# Patient Record
Sex: Female | Born: 1940 | Race: White | Hispanic: No | Smoking: Never smoker
Health system: Southern US, Community
[De-identification: ages and names within clinical notes are randomized; demographics above are authoritative.]

## PROBLEM LIST (undated history)

## (undated) DIAGNOSIS — E049 Nontoxic goiter, unspecified: Secondary | ICD-10-CM

## (undated) DIAGNOSIS — J45909 Unspecified asthma, uncomplicated: Secondary | ICD-10-CM

## (undated) DIAGNOSIS — E785 Hyperlipidemia, unspecified: Secondary | ICD-10-CM

## (undated) DIAGNOSIS — K219 Gastro-esophageal reflux disease without esophagitis: Secondary | ICD-10-CM

## (undated) DIAGNOSIS — R7309 Other abnormal glucose: Secondary | ICD-10-CM

## (undated) DIAGNOSIS — D649 Anemia, unspecified: Secondary | ICD-10-CM

## (undated) DIAGNOSIS — I341 Nonrheumatic mitral (valve) prolapse: Secondary | ICD-10-CM

## (undated) DIAGNOSIS — M8580 Other specified disorders of bone density and structure, unspecified site: Secondary | ICD-10-CM

## (undated) DIAGNOSIS — R195 Other fecal abnormalities: Secondary | ICD-10-CM

## (undated) DIAGNOSIS — I10 Essential (primary) hypertension: Secondary | ICD-10-CM

## (undated) HISTORY — PX: TONSILLECTOMY AND ADENOIDECTOMY: SHX28

## (undated) HISTORY — DX: Gastro-esophageal reflux disease without esophagitis: K21.9

## (undated) HISTORY — DX: Essential (primary) hypertension: I10

## (undated) HISTORY — DX: Other abnormal glucose: R73.09

## (undated) HISTORY — PX: APPENDECTOMY: SHX54

## (undated) HISTORY — DX: Hyperlipidemia, unspecified: E78.5

## (undated) HISTORY — DX: Nonrheumatic mitral (valve) prolapse: I34.1

## (undated) HISTORY — PX: EYE SURGERY: SHX253

## (undated) HISTORY — DX: Other specified disorders of bone density and structure, unspecified site: M85.80

## (undated) HISTORY — DX: Nontoxic goiter, unspecified: E04.9

## (undated) HISTORY — DX: Unspecified asthma, uncomplicated: J45.909

## (undated) HISTORY — DX: Anemia, unspecified: D64.9

## (undated) HISTORY — PX: CHOLECYSTECTOMY: SHX55

---
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
## 1998-07-11 ENCOUNTER — Ambulatory Visit (HOSPITAL_COMMUNITY): Admission: RE | Admit: 1998-07-11 | Discharge: 1998-07-11 | Payer: Self-pay | Admitting: *Deleted

## 1998-08-09 ENCOUNTER — Ambulatory Visit (HOSPITAL_COMMUNITY): Admission: RE | Admit: 1998-08-09 | Discharge: 1998-08-09 | Payer: Self-pay | Admitting: Gastroenterology

## 1999-07-06 ENCOUNTER — Other Ambulatory Visit: Admission: RE | Admit: 1999-07-06 | Discharge: 1999-07-06 | Payer: Self-pay | Admitting: *Deleted

## 1999-09-04 ENCOUNTER — Ambulatory Visit (HOSPITAL_COMMUNITY): Admission: RE | Admit: 1999-09-04 | Discharge: 1999-09-04 | Payer: Self-pay | Admitting: *Deleted

## 2000-09-05 ENCOUNTER — Other Ambulatory Visit: Admission: RE | Admit: 2000-09-05 | Discharge: 2000-09-05 | Payer: Self-pay | Admitting: Internal Medicine

## 2000-09-05 ENCOUNTER — Ambulatory Visit (HOSPITAL_COMMUNITY): Admission: RE | Admit: 2000-09-05 | Discharge: 2000-09-05 | Payer: Self-pay | Admitting: *Deleted

## 2001-09-15 ENCOUNTER — Encounter: Payer: Self-pay | Admitting: Internal Medicine

## 2001-09-15 ENCOUNTER — Encounter: Admission: RE | Admit: 2001-09-15 | Discharge: 2001-09-15 | Payer: Self-pay | Admitting: Internal Medicine

## 2001-09-23 ENCOUNTER — Ambulatory Visit (HOSPITAL_COMMUNITY): Admission: RE | Admit: 2001-09-23 | Discharge: 2001-09-23 | Payer: Self-pay | Admitting: Internal Medicine

## 2001-09-23 ENCOUNTER — Encounter: Payer: Self-pay | Admitting: Internal Medicine

## 2001-09-25 ENCOUNTER — Ambulatory Visit (HOSPITAL_COMMUNITY): Admission: RE | Admit: 2001-09-25 | Discharge: 2001-09-25 | Payer: Self-pay | Admitting: Internal Medicine

## 2001-09-25 ENCOUNTER — Encounter: Payer: Self-pay | Admitting: Internal Medicine

## 2002-09-10 ENCOUNTER — Ambulatory Visit (HOSPITAL_COMMUNITY): Admission: RE | Admit: 2002-09-10 | Discharge: 2002-09-10 | Payer: Self-pay | Admitting: Internal Medicine

## 2002-09-10 ENCOUNTER — Encounter: Payer: Self-pay | Admitting: Internal Medicine

## 2002-09-16 ENCOUNTER — Other Ambulatory Visit: Admission: RE | Admit: 2002-09-16 | Discharge: 2002-09-16 | Payer: Self-pay | Admitting: Otolaryngology

## 2002-09-27 ENCOUNTER — Ambulatory Visit (HOSPITAL_COMMUNITY): Admission: RE | Admit: 2002-09-27 | Discharge: 2002-09-27 | Payer: Self-pay | Admitting: Internal Medicine

## 2002-09-27 ENCOUNTER — Encounter: Payer: Self-pay | Admitting: Internal Medicine

## 2003-09-29 ENCOUNTER — Ambulatory Visit (HOSPITAL_COMMUNITY): Admission: RE | Admit: 2003-09-29 | Discharge: 2003-09-29 | Payer: Self-pay | Admitting: Internal Medicine

## 2004-10-08 ENCOUNTER — Ambulatory Visit (HOSPITAL_COMMUNITY): Admission: RE | Admit: 2004-10-08 | Discharge: 2004-10-08 | Payer: Self-pay | Admitting: Internal Medicine

## 2004-12-22 ENCOUNTER — Emergency Department (HOSPITAL_COMMUNITY): Admission: EM | Admit: 2004-12-22 | Discharge: 2004-12-22 | Payer: Self-pay | Admitting: Emergency Medicine

## 2005-11-11 ENCOUNTER — Ambulatory Visit (HOSPITAL_COMMUNITY): Admission: RE | Admit: 2005-11-11 | Discharge: 2005-11-11 | Payer: Self-pay | Admitting: Internal Medicine

## 2009-08-11 ENCOUNTER — Ambulatory Visit (HOSPITAL_COMMUNITY): Admission: RE | Admit: 2009-08-11 | Discharge: 2009-08-11 | Payer: Self-pay | Admitting: Internal Medicine

## 2009-09-09 LAB — HM PAP SMEAR: HM PAP: NEGATIVE

## 2009-12-05 ENCOUNTER — Ambulatory Visit (HOSPITAL_COMMUNITY): Admission: RE | Admit: 2009-12-05 | Discharge: 2009-12-05 | Payer: Self-pay | Admitting: Internal Medicine

## 2009-12-14 ENCOUNTER — Ambulatory Visit (HOSPITAL_COMMUNITY): Admission: RE | Admit: 2009-12-14 | Discharge: 2009-12-14 | Payer: Self-pay | Admitting: Internal Medicine

## 2010-01-11 LAB — HM COLONOSCOPY

## 2010-09-30 ENCOUNTER — Encounter: Payer: Self-pay | Admitting: Internal Medicine

## 2010-10-10 ENCOUNTER — Other Ambulatory Visit (HOSPITAL_COMMUNITY): Payer: Self-pay | Admitting: Internal Medicine

## 2010-10-10 DIAGNOSIS — Z1239 Encounter for other screening for malignant neoplasm of breast: Secondary | ICD-10-CM

## 2010-10-10 DIAGNOSIS — Z1231 Encounter for screening mammogram for malignant neoplasm of breast: Secondary | ICD-10-CM

## 2010-10-18 ENCOUNTER — Ambulatory Visit (HOSPITAL_COMMUNITY)
Admission: RE | Admit: 2010-10-18 | Discharge: 2010-10-18 | Disposition: A | Discharge status: Home / Self Care | Payer: Medicare Other | Source: Ambulatory Visit | Attending: Internal Medicine | Admitting: Internal Medicine

## 2010-10-18 DIAGNOSIS — Z1231 Encounter for screening mammogram for malignant neoplasm of breast: Secondary | ICD-10-CM | POA: Insufficient documentation

## 2011-11-26 ENCOUNTER — Other Ambulatory Visit (HOSPITAL_COMMUNITY): Payer: Self-pay | Admitting: Internal Medicine

## 2011-11-26 DIAGNOSIS — Z1231 Encounter for screening mammogram for malignant neoplasm of breast: Secondary | ICD-10-CM

## 2011-12-18 ENCOUNTER — Other Ambulatory Visit (HOSPITAL_COMMUNITY): Payer: Self-pay | Admitting: Internal Medicine

## 2011-12-18 DIAGNOSIS — Z1231 Encounter for screening mammogram for malignant neoplasm of breast: Secondary | ICD-10-CM

## 2011-12-18 DIAGNOSIS — M8580 Other specified disorders of bone density and structure, unspecified site: Secondary | ICD-10-CM

## 2012-01-07 ENCOUNTER — Ambulatory Visit (HOSPITAL_COMMUNITY): Payer: Medicare Other

## 2012-09-23 ENCOUNTER — Ambulatory Visit (HOSPITAL_COMMUNITY)
Admission: RE | Admit: 2012-09-23 | Discharge: 2012-09-23 | Disposition: A | Discharge status: Home / Self Care | Payer: Medicare Other | Source: Ambulatory Visit | Attending: Internal Medicine | Admitting: Internal Medicine

## 2012-09-23 DIAGNOSIS — Z780 Asymptomatic menopausal state: Secondary | ICD-10-CM | POA: Insufficient documentation

## 2012-09-23 DIAGNOSIS — Z13820 Encounter for screening for osteoporosis: Secondary | ICD-10-CM | POA: Insufficient documentation

## 2012-09-23 DIAGNOSIS — Z1231 Encounter for screening mammogram for malignant neoplasm of breast: Secondary | ICD-10-CM

## 2012-09-23 DIAGNOSIS — M8580 Other specified disorders of bone density and structure, unspecified site: Secondary | ICD-10-CM

## 2012-09-23 DIAGNOSIS — M899 Disorder of bone, unspecified: Secondary | ICD-10-CM | POA: Insufficient documentation

## 2012-09-23 LAB — HM DEXA SCAN

## 2013-02-16 LAB — FECAL OCCULT BLOOD, GUAIAC: Fecal Occult Blood: NEGATIVE

## 2013-06-23 LAB — LIPID PANEL
Cholesterol: 207 mg/dL — AB (ref 0–200)
HDL: 52 mg/dL (ref 35–70)
LDL CALC: 127 mg/dL
Triglycerides: 141 mg/dL (ref 40–160)

## 2013-06-23 LAB — HEMOGLOBIN A1C: HEMOGLOBIN A1C: 5.8 % (ref 4.0–6.0)

## 2013-06-25 ENCOUNTER — Other Ambulatory Visit (HOSPITAL_COMMUNITY): Payer: Self-pay | Admitting: Internal Medicine

## 2013-06-25 DIAGNOSIS — Z1231 Encounter for screening mammogram for malignant neoplasm of breast: Secondary | ICD-10-CM

## 2013-07-01 ENCOUNTER — Ambulatory Visit (HOSPITAL_COMMUNITY)
Admission: RE | Admit: 2013-07-01 | Discharge: 2013-07-01 | Disposition: A | Discharge status: Home / Self Care | Payer: Medicare Other | Source: Ambulatory Visit | Attending: Internal Medicine | Admitting: Internal Medicine

## 2013-07-01 DIAGNOSIS — Z1231 Encounter for screening mammogram for malignant neoplasm of breast: Secondary | ICD-10-CM

## 2013-07-01 LAB — HM MAMMOGRAPHY: HM MAMMO: NEGATIVE

## 2013-09-12 ENCOUNTER — Encounter: Payer: Self-pay | Admitting: Internal Medicine

## 2013-09-12 DIAGNOSIS — D649 Anemia, unspecified: Secondary | ICD-10-CM

## 2013-09-12 DIAGNOSIS — K219 Gastro-esophageal reflux disease without esophagitis: Secondary | ICD-10-CM | POA: Insufficient documentation

## 2013-09-12 DIAGNOSIS — E785 Hyperlipidemia, unspecified: Secondary | ICD-10-CM

## 2013-09-12 DIAGNOSIS — I10 Essential (primary) hypertension: Secondary | ICD-10-CM

## 2013-09-12 DIAGNOSIS — I341 Nonrheumatic mitral (valve) prolapse: Secondary | ICD-10-CM | POA: Insufficient documentation

## 2013-09-12 DIAGNOSIS — J45909 Unspecified asthma, uncomplicated: Secondary | ICD-10-CM

## 2013-09-12 DIAGNOSIS — E782 Mixed hyperlipidemia: Secondary | ICD-10-CM | POA: Insufficient documentation

## 2013-09-12 DIAGNOSIS — E049 Nontoxic goiter, unspecified: Secondary | ICD-10-CM

## 2013-09-12 DIAGNOSIS — R7309 Other abnormal glucose: Secondary | ICD-10-CM | POA: Insufficient documentation

## 2013-09-16 ENCOUNTER — Encounter: Payer: Self-pay | Admitting: Emergency Medicine

## 2013-09-16 ENCOUNTER — Ambulatory Visit (INDEPENDENT_AMBULATORY_CARE_PROVIDER_SITE_OTHER): Payer: Medicare Other | Admitting: Emergency Medicine

## 2013-09-16 VITALS — BP 112/64 | HR 78 | Temp 98.0°F | Resp 16 | Ht 64.5 in | Wt 141.0 lb

## 2013-09-16 DIAGNOSIS — I10 Essential (primary) hypertension: Secondary | ICD-10-CM

## 2013-09-16 DIAGNOSIS — R059 Cough, unspecified: Secondary | ICD-10-CM

## 2013-09-16 DIAGNOSIS — R7309 Other abnormal glucose: Secondary | ICD-10-CM

## 2013-09-16 DIAGNOSIS — J309 Allergic rhinitis, unspecified: Secondary | ICD-10-CM

## 2013-09-16 DIAGNOSIS — J329 Chronic sinusitis, unspecified: Secondary | ICD-10-CM

## 2013-09-16 DIAGNOSIS — E782 Mixed hyperlipidemia: Secondary | ICD-10-CM

## 2013-09-16 DIAGNOSIS — R05 Cough: Secondary | ICD-10-CM

## 2013-09-16 DIAGNOSIS — E559 Vitamin D deficiency, unspecified: Secondary | ICD-10-CM

## 2013-09-16 LAB — CBC WITH DIFFERENTIAL/PLATELET
BASOS ABS: 0.1 10*3/uL (ref 0.0–0.1)
BASOS PCT: 1 % (ref 0–1)
Eosinophils Absolute: 0.2 10*3/uL (ref 0.0–0.7)
Eosinophils Relative: 3 % (ref 0–5)
HEMATOCRIT: 41.4 % (ref 36.0–46.0)
Hemoglobin: 14 g/dL (ref 12.0–15.0)
Lymphocytes Relative: 15 % (ref 12–46)
Lymphs Abs: 1.3 10*3/uL (ref 0.7–4.0)
MCH: 32 pg (ref 26.0–34.0)
MCHC: 33.8 g/dL (ref 30.0–36.0)
MCV: 94.5 fL (ref 78.0–100.0)
Monocytes Absolute: 0.8 10*3/uL (ref 0.1–1.0)
Monocytes Relative: 9 % (ref 3–12)
NEUTROS ABS: 6 10*3/uL (ref 1.7–7.7)
NEUTROS PCT: 72 % (ref 43–77)
Platelets: 333 10*3/uL (ref 150–400)
RBC: 4.38 MIL/uL (ref 3.87–5.11)
RDW: 13.5 % (ref 11.5–15.5)
WBC: 8.4 10*3/uL (ref 4.0–10.5)

## 2013-09-16 LAB — HEMOGLOBIN A1C
Hgb A1c MFr Bld: 5.3 % (ref <5.7)
Mean Plasma Glucose: 105 mg/dL (ref <117)

## 2013-09-16 MED ORDER — PREDNISONE 10 MG PO TABS: ORAL_TABLET | ORAL | Status: DC

## 2013-09-16 MED ORDER — BENZONATATE 200 MG PO CAPS: 200.0000 mg | ORAL_CAPSULE | Freq: Three times a day (TID) | ORAL | Status: DC | PRN

## 2013-09-16 MED ORDER — AZITHROMYCIN 250 MG PO TABS: ORAL_TABLET | ORAL | Status: AC

## 2013-09-16 NOTE — Progress Notes (Signed)
Subjective:    Patient ID: Isabel Oliver, female    DOB: February 20, 1941, 73 y.o.   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 She started with allergy drainage and lung congestion after exposure to ducks. She has noted bird allergy. She is now having colored production.  She is also needs presents for 3 month F/U for HTN, Cholesterol, Pre-Dm, D. Deficient. LAST LABS T 207 TG 141 LDL 127 H 52 A1C 5.8  D 63 INSULIN 25  She is eating healthy, small portions, decreased meats/ cheeses. She is more active with new job. Her BP has been good at home.  Hyperlipidemia  Hypertension   Current Outpatient Prescriptions on File Prior to Visit  Medication Sig Dispense Refill  . albuterol (PROVENTIL HFA;VENTOLIN HFA) 108 (90 BASE) MCG/ACT inhaler Inhale into the lungs every 6 (six) hours as needed for wheezing or shortness of breath.      Marland Kitchen aspirin 81 MG chewable tablet Chew 81 mg by mouth daily.      . cetirizine (ZYRTEC) 10 MG tablet Take 10 mg by mouth daily.      . Cholecalciferol (VITAMIN D) 2000 UNITS CAPS Take by mouth.      Marland Kitchen ipratropium-albuterol (DUONEB) 0.5-2.5 (3) MG/3ML SOLN Take 3 mLs by nebulization.      . montelukast (SINGULAIR) 10 MG tablet Take 10 mg by mouth at bedtime.      Marland Kitchen omeprazole (PRILOSEC) 20 MG capsule Take 20 mg by mouth daily.      . pravastatin (PRAVACHOL) 40 MG tablet Take 40 mg by mouth daily.      . verapamil (VERELAN PM) 240 MG 24 hr capsule Take 240 mg by mouth at bedtime.       No current facility-administered medications on file prior to visit.   ALLERGIES Adhesive; Codeine; Penicillins; Shellfish allergy; and Iodine  Past Medical History  Diagnosis Date  . Hypertension   . Hyperlipidemia   . GERD (gastroesophageal reflux disease)   . Other abnormal glucose   . Anemia   . Asthma   . Osteopenia   . MVP (mitral valve prolapse)   . Goiter       Review of  Systems  HENT: Positive for congestion, postnasal drip and sinus pressure.   Respiratory: Positive for cough.   All other systems reviewed and are negative.   BP 112/64  Pulse 78  Temp(Src) 98 F (36.7 C) (Temporal)  Resp 16  Ht 5' 4.5" (1.638 m)  Wt 141 lb (63.957 kg)  BMI 23.84 kg/m2     Objective:   Physical Exam  Nursing note and vitals reviewed. Constitutional: She is oriented to person, place, and time. She appears well-developed and well-nourished. No distress.  HENT:  Head: Normocephalic and atraumatic.  Right Ear: External ear normal.  Left Ear: External ear normal.  Nose: Nose normal.  Mouth/Throat: Oropharynx is clear and moist. No oropharyngeal exudate.  Yellow TMs Maxillary/ Frontal tenderness  Eyes: Conjunctivae and EOM are normal.  Neck: Normal range of motion. Neck supple. No JVD present. No thyromegaly present.  Cardiovascular: Normal rate, regular rhythm, normal heart sounds and intact distal pulses.   Pulmonary/Chest: Effort normal and breath sounds normal.  Abdominal: Soft. Bowel sounds are normal. She exhibits no distension and no mass. There is no tenderness. There is no rebound and no guarding.  Musculoskeletal: Normal range of motion. She exhibits no edema and no tenderness.  Lymphadenopathy:    She has no cervical adenopathy.  Neurological: She is alert and oriented to person, place, and time. No cranial nerve deficit.  Skin: Skin is warm and dry. No rash noted. No erythema. No pallor.  Psychiatric: She has a normal mood and affect. Her behavior is normal. Judgment and thought content normal.          Assessment & Plan:  1.  3 month F/U for HTN, Cholesterol, Pre-Dm, D. Deficient. Needs healthy diet, cardio QD and obtain healthy weight. Check Labs, Check BP if >130/80 call office 2. Cough/ Sinusitis/ Allergic rhinitis-Switch to Allegra OTC, increase H2o, allergy hygiene explained. Zpak, Pred DP 10 mg, Tessalon Perles 200 mg all AD.

## 2013-09-16 NOTE — Patient Instructions (Signed)
Sinusitis Sinusitis is redness, soreness, and puffiness (inflammation) of the air pockets in the bones of your face (sinuses). The redness, soreness, and puffiness can cause air and mucus to get trapped in your sinuses. This can allow germs to grow and cause an infection.  HOME CARE   Drink enough fluids to keep your pee (urine) clear or pale yellow.  Use a humidifier in your home.  Run a hot shower to create steam in the bathroom. Sit in the bathroom with the door closed. Breathe in the steam 3 4 times a day.  Put a warm, moist washcloth on your face 3 4 times a day, or as told by your doctor.  Use salt water sprays (saline sprays) to wet the thick fluid in your nose. This can help the sinuses drain.  Only take medicine as told by your doctor. GET HELP RIGHT AWAY IF:   Your pain gets worse.  You have very bad headaches.  You are sick to your stomach (nauseous).  You throw up (vomit).  You are very sleepy (drowsy) all the time.  Your face is puffy (swollen).  Your vision changes.  You have a stiff neck.  You have trouble breathing. MAKE SURE YOU:   Understand these instructions.  Will watch your condition.  Will get help right away if you are not doing well or get worse. Document Released: 02/12/2008 Document Revised: 05/20/2012 Document Reviewed: 03/31/2012 [facility] Patient Information 2014 [place]. Allergic Rhinitis Allergic rhinitis is when the mucous membranes in the nose respond to allergens. Allergens are particles in the air that cause your body to have an allergic reaction. This causes you to release allergic antibodies. Through a chain of events, these eventually cause you to release histamine into the blood stream (hence the use of antihistamines). Although meant to be protective to the body, it is this release that causes your discomfort, such as frequent sneezing, congestion and an itchy runny nose.  CAUSES  The pollen allergens may come from grasses,  trees, and weeds. This is seasonal allergic rhinitis, or "hay fever." Other allergens cause year-round allergic rhinitis (perennial allergic rhinitis) such as house dust mite allergen, pet dander and mold spores.  SYMPTOMS   Nasal stuffiness (congestion).  Runny, itchy nose with sneezing and tearing of the eyes.  There is often an itching of the mouth, eyes and ears. It cannot be cured, but it can be controlled with medications. DIAGNOSIS  If you are unable to determine the offending allergen, skin or blood testing may find it. TREATMENT   Avoid the allergen.  Medications and allergy shots (immunotherapy) can help.  Hay fever may often be treated with antihistamines in pill or nasal spray forms. Antihistamines block the effects of histamine. There are over-the-counter medicines that may help with nasal congestion and swelling around the eyes. Check with your caregiver before taking or giving this medicine. If the treatment above does not work, there are many new medications your caregiver can prescribe. Stronger medications may be used if initial measures are ineffective. Desensitizing injections can be used if medications and avoidance fails. Desensitization is when a patient is given ongoing shots until the body becomes less sensitive to the allergen. Make sure you follow up with your caregiver if problems continue. SEEK MEDICAL CARE IF:   You develop fever (more than 100.5 F (38.1 C).  You develop a cough that does not stop easily (persistent).  You have shortness of breath.  You start wheezing.  Symptoms interfere with  normal daily activities. Document Released: 05/21/2001 Document Revised: 11/18/2011 Document Reviewed: 11/30/2008 [facility] Patient Information 2014 [place].

## 2013-09-17 LAB — HEPATIC FUNCTION PANEL
ALT: 16 U/L (ref 0–35)
AST: 20 U/L (ref 0–37)
Albumin: 4.4 g/dL (ref 3.5–5.2)
Alkaline Phosphatase: 79 U/L (ref 39–117)
BILIRUBIN DIRECT: 0.1 mg/dL (ref 0.0–0.3)
BILIRUBIN INDIRECT: 0.2 mg/dL (ref 0.0–0.9)
BILIRUBIN TOTAL: 0.3 mg/dL (ref 0.3–1.2)
Total Protein: 7 g/dL (ref 6.0–8.3)

## 2013-09-17 LAB — BASIC METABOLIC PANEL WITH GFR
BUN: 11 mg/dL (ref 6–23)
CALCIUM: 10.1 mg/dL (ref 8.4–10.5)
CHLORIDE: 102 meq/L (ref 96–112)
CO2: 30 mEq/L (ref 19–32)
CREATININE: 0.63 mg/dL (ref 0.50–1.10)
Glucose, Bld: 80 mg/dL (ref 70–99)
Potassium: 4.5 mEq/L (ref 3.5–5.3)
Sodium: 139 mEq/L (ref 135–145)

## 2013-09-17 LAB — LIPID PANEL
CHOL/HDL RATIO: 3.8 ratio
CHOLESTEROL: 216 mg/dL — AB (ref 0–200)
HDL: 57 mg/dL (ref >39)
LDL CALC: 133 mg/dL — AB (ref 0–99)
TRIGLYCERIDES: 132 mg/dL (ref <150)
VLDL: 26 mg/dL (ref 0–40)

## 2013-09-17 LAB — INSULIN, FASTING: Insulin fasting, serum: 14 u[IU]/mL (ref 3–28)

## 2013-12-29 ENCOUNTER — Other Ambulatory Visit: Payer: Self-pay | Admitting: Emergency Medicine

## 2013-12-29 MED ORDER — ALBUTEROL SULFATE HFA 108 (90 BASE) MCG/ACT IN AERS: 1.0000 | INHALATION_SPRAY | Freq: Four times a day (QID) | RESPIRATORY_TRACT | Status: DC | PRN

## 2014-01-20 ENCOUNTER — Encounter: Payer: Self-pay | Admitting: Physician Assistant

## 2014-02-01 ENCOUNTER — Encounter: Payer: Self-pay | Admitting: Physician Assistant

## 2014-02-01 ENCOUNTER — Ambulatory Visit (INDEPENDENT_AMBULATORY_CARE_PROVIDER_SITE_OTHER): Payer: Medicare Other | Admitting: Physician Assistant

## 2014-02-01 VITALS — BP 122/70 | HR 76 | Temp 98.1°F | Resp 16

## 2014-02-01 DIAGNOSIS — Z0000 Encounter for general adult medical examination without abnormal findings: Secondary | ICD-10-CM

## 2014-02-01 DIAGNOSIS — Z1331 Encounter for screening for depression: Secondary | ICD-10-CM

## 2014-02-01 DIAGNOSIS — I059 Rheumatic mitral valve disease, unspecified: Secondary | ICD-10-CM

## 2014-02-01 DIAGNOSIS — I341 Nonrheumatic mitral (valve) prolapse: Secondary | ICD-10-CM

## 2014-02-01 DIAGNOSIS — I10 Essential (primary) hypertension: Secondary | ICD-10-CM

## 2014-02-01 DIAGNOSIS — Z23 Encounter for immunization: Secondary | ICD-10-CM

## 2014-02-01 DIAGNOSIS — Z789 Other specified health status: Secondary | ICD-10-CM

## 2014-02-01 DIAGNOSIS — E559 Vitamin D deficiency, unspecified: Secondary | ICD-10-CM

## 2014-02-01 DIAGNOSIS — E785 Hyperlipidemia, unspecified: Secondary | ICD-10-CM

## 2014-02-01 DIAGNOSIS — M810 Age-related osteoporosis without current pathological fracture: Secondary | ICD-10-CM

## 2014-02-01 DIAGNOSIS — D649 Anemia, unspecified: Secondary | ICD-10-CM

## 2014-02-01 DIAGNOSIS — R7309 Other abnormal glucose: Secondary | ICD-10-CM

## 2014-02-01 DIAGNOSIS — K219 Gastro-esophageal reflux disease without esophagitis: Secondary | ICD-10-CM

## 2014-02-01 DIAGNOSIS — Z79899 Other long term (current) drug therapy: Secondary | ICD-10-CM

## 2014-02-01 LAB — CBC WITH DIFFERENTIAL/PLATELET
Basophils Absolute: 0.1 10*3/uL (ref 0.0–0.1)
Basophils Relative: 1 % (ref 0–1)
Eosinophils Absolute: 0.2 10*3/uL (ref 0.0–0.7)
Eosinophils Relative: 4 % (ref 0–5)
HCT: 38.9 % (ref 36.0–46.0)
HEMOGLOBIN: 13 g/dL (ref 12.0–15.0)
LYMPHS ABS: 2.1 10*3/uL (ref 0.7–4.0)
LYMPHS PCT: 37 % (ref 12–46)
MCH: 30.8 pg (ref 26.0–34.0)
MCHC: 33.4 g/dL (ref 30.0–36.0)
MCV: 92.2 fL (ref 78.0–100.0)
MONOS PCT: 9 % (ref 3–12)
Monocytes Absolute: 0.5 10*3/uL (ref 0.1–1.0)
NEUTROS ABS: 2.8 10*3/uL (ref 1.7–7.7)
NEUTROS PCT: 49 % (ref 43–77)
PLATELETS: 281 10*3/uL (ref 150–400)
RBC: 4.22 MIL/uL (ref 3.87–5.11)
RDW: 13.7 % (ref 11.5–15.5)
WBC: 5.8 10*3/uL (ref 4.0–10.5)

## 2014-02-01 LAB — HEMOGLOBIN A1C
HEMOGLOBIN A1C: 5.4 % (ref <5.7)
Mean Plasma Glucose: 108 mg/dL (ref <117)

## 2014-02-01 MED ORDER — ALENDRONATE SODIUM 70 MG PO TABS: 70.0000 mg | ORAL_TABLET | ORAL | Status: DC

## 2014-02-01 NOTE — Patient Instructions (Signed)

## 2014-02-01 NOTE — Progress Notes (Signed)
Assessment:   1. MVP (mitral valve prolapse) controlled  2. Hypertension - CBC with Differential - BASIC METABOLIC PANEL WITH GFR - Hepatic function panel - TSH - Urinalysis, Routine w reflex microscopic - Microalbumin / creatinine urine ratio - EKG 12-Lead - [place], RETROPERITNL ABD,  LTD  3. GERD (gastroesophageal reflux disease) Cont prilosec  4. Other abnormal glucose Discussed general issues about diabetes pathophysiology and management., Educational material distributed., Suggested low cholesterol diet., Encouraged aerobic exercise., Discussed foot care., Reminded to get yearly retinal exam. - Hemoglobin A1c - Insulin, fasting  5. Hyperlipidemia -continue medications, check lipids, decrease fatty foods, increase activity.  - Lipid panel  6. Anemia Check CBC  7. Osteoporosis, unspecified Discussed the risk of fracture and she is willing to do Fosamax, this was called in, due DEXA in 2016  8. Unspecified vitamin D deficiency - Vit D  25 hydroxy (rtn osteoporosis monitoring)  9. Encounter for long-term (current) use of other medications - Magnesium   Plan:   During the course of the visit the patient was educated and counseled about appropriate screening and preventive services including:    Pneumococcal vaccine   Influenza vaccine  Td vaccine  Screening electrocardiogram  Screening mammography  Bone densitometry screening  Colorectal cancer screening  Diabetes screening  Glaucoma screening  Nutrition counseling   Screening recommendations, referrals:  Vaccinations: Tdap vaccine not indicated Influenza vaccine not indicated Pneumococcal vaccine not indicated Shingles vaccine not indicated Hep B vaccine not indicated Prevnar 13: TODAY  Nutrition assessed and recommended  Colonoscopy up to date Mammogram up to date Pap smear not indicated Pelvic exam not indicated Recommended yearly ophthalmology/optometry visit for glaucoma screening and  checkup Recommended yearly dental visit for hygiene and checkup Advanced directives - requested  Conditions/risks identified:  BMI: Discussed weight loss, diet, and increase physical activity.  Increase physical activity: AHA recommends 150 minutes of physical activity a week.  Medications reviewed DEXA- due 2017 Urinary Incontinence is not an issue: discussed non pharmacology and pharmacology options.  Fall risk: low- discussed PT, home fall assessment, medications. Will start on fosamax due to ostroporosis.   Subjective:   Isabel Oliver is a 73 y.o. female who presents for Medicare Annual Wellness Visit and complete physical.    Date of last medicare wellness visit is unknown.  Her blood pressure has been controlled at home, today their BP is BP: 122/70 mmHg She does workout, she works at [employer] and walks a lot. She denies chest pain, shortness of breath, dizziness.  She is on cholesterol medication and denies myalgias. Her cholesterol is not at goal. The cholesterol last visit was:   Lab Results  Component Value Date   CHOL 216* 09/16/2013   HDL 57 09/16/2013   LDLCALC 133* 09/16/2013   TRIG 132 09/16/2013   CHOLHDL 3.8 09/16/2013    Last A1C in the office was:  Lab Results  Component Value Date   HGBA1C 5.3 09/16/2013   Patient is on Vitamin D supplement.     Names of Other Physician/Practitioners you currently use: 1. [facility]- here for primary care 2. Dr. Katy Fitch, eye doctor, last visit 2 years ago, has an appointment 3. Dr. In Adrian Blackwater, dentist, last visit q 6 months Patient Care Team: Unk Pinto, MD as PCP - General (Internal Medicine) Winfield Cunas., MD as Consulting Physician (Gastroenterology) Gus Height, MD as Consulting Physician (Obstetrics and Gynecology) Bonnita Levan, MD as Consulting Physician (Dermatology)   Medication Review  Current Outpatient Prescriptions on File Prior to Visit  Medication Sig Dispense  Refill  . albuterol (PROVENTIL HFA;VENTOLIN HFA) 108 (90 BASE) MCG/ACT inhaler Inhale 1-2 puffs into the lungs every 6 (six) hours as needed for wheezing or shortness of breath.  18 g  3  . aspirin 81 MG chewable tablet Chew 81 mg by mouth daily.      . benzonatate (TESSALON) 200 MG capsule Take 1 capsule (200 mg total) by mouth 3 (three) times daily as needed for cough.  42 capsule  0  . cetirizine (ZYRTEC) 10 MG tablet Take 10 mg by mouth daily.      . Cholecalciferol (VITAMIN D) 2000 UNITS CAPS Take by mouth.      Marland Kitchen ipratropium-albuterol (DUONEB) 0.5-2.5 (3) MG/3ML SOLN Take 3 mLs by nebulization.      . montelukast (SINGULAIR) 10 MG tablet Take 10 mg by mouth at bedtime.      Marland Kitchen omeprazole (PRILOSEC) 20 MG capsule Take 20 mg by mouth daily.      . pravastatin (PRAVACHOL) 40 MG tablet Take 40 mg by mouth daily.      . predniSONE (DELTASONE) 10 MG tablet 1 po TID x 3 days, 1 PO BID x 3 days, 1 po QD x 5 days  20 tablet  0  . verapamil (VERELAN PM) 240 MG 24 hr capsule Take 240 mg by mouth at bedtime.       No current facility-administered medications on file prior to visit.    Current Problems (verified) Patient Active Problem List   Diagnosis Date Noted  . Other abnormal glucose   . MVP (mitral valve prolapse)   . Hypertension   . Hyperlipidemia   . GERD (gastroesophageal reflux disease)   . Anemia   . Asthma   . Goiter     Screening Tests Health Maintenance  Topic Date Due  . Influenza Vaccine  04/09/2014  . Mammogram  07/02/2015  . Tetanus/tdap  12/06/2019  . Colonoscopy  01/12/2020  . Pneumococcal Polysaccharide Vaccine Age 59 And Over  Completed  . Zostavax  Completed    Immunization History  Administered Date(s) Administered  . Influenza-Unspecified 06/28/2013  . Pneumococcal-Unspecified 09/10/2007  . Tdap 12/05/2009  . Zoster 09/09/2005    Preventative care: Last colonoscopy: 2011 Last mammogram: 06/2013 Last pap smear/pelvic exam: 2011 DEXA:09/2012- due  09/2014- showed + osteoporsis- on D 3 and calcium Prevnar 13 DUE  Prior vaccinations: TD or Tdap: 2011  Influenza: 2014  Pneumococcal: 2009 Shingles/Zostavax: 2007  History reviewed: allergies, current medications, past family history, past medical history, past social history, past surgical history and problem list  Risk Factors: Osteoporosis: postmenopausal estrogen deficiency and dietary calcium and/or vitamin D deficiency History of fracture in the past year: no  Tobacco History  Substance Use Topics  . Smoking status: Never Smoker   . Smokeless tobacco: Not on file  . Alcohol Use: No   She does not smoke.  Patient is not a former smoker. Are there smokers in your home (other than you)?  No  Alcohol Current alcohol use: none  Caffeine Current caffeine use: coffee 1 /day  Exercise Current exercise: walking  Nutrition/Diet Current diet: in general, a "healthy" diet    Cardiac risk factors: advanced age (older than 72 for men, 63 for women), dyslipidemia, hypertension and sedentary lifestyle.  Depression Screen (Note: if answer to either of the following is "Yes", a more complete depression screening is indicated)   Q1: Over the past two weeks,  have you felt down, depressed or hopeless? No  Q2: Over the past two weeks, have you felt little interest or pleasure in doing things? No  Have you lost interest or pleasure in daily life? No  Do you often feel hopeless? No  Do you cry easily over simple problems? No  Activities of Daily Living In your present state of health, do you have any difficulty performing the following activities?:  Driving? No Managing money?  No Feeding yourself? No Getting from bed to chair? No Climbing a flight of stairs? No Preparing food and eating?: No Bathing or showering? No Getting dressed: No Getting to the toilet? No Using the toilet:No Moving around from place to place: No In the past year have you fallen or had a near  fall?:No   Are you sexually active?  No  Do you have more than one partner?  No  Vision Difficulties: No  Hearing Difficulties: No Do you often ask people to speak up or repeat themselves? No Do you experience ringing or noises in your ears? No Do you have difficulty understanding soft or whispered voices? No  Cognition  Do you feel that you have a problem with memory?No  Do you often misplace items? No  Do you feel safe at home?  Yes  Advanced directives Does patient have a Crystal? Yes Does patient have a Living Will? Yes   Objective:     Blood pressure 122/70, pulse 76, temperature 98.1 F (36.7 C), resp. rate 16. There is no weight on file to calculate BMI.  General appearance: alert, no distress, WD/WN,  female Cognitive Testing  Alert? Yes  Normal Appearance?Yes  Oriented to person? Yes  Place? Yes   Time? Yes  Recall of three objects?  Yes  Can perform simple calculations? Yes  Displays appropriate judgment?Yes  Can read the correct time from a watch face?Yes  HEENT: normocephalic, sclerae anicteric, TMs pearly, nares patent, no discharge or erythema, pharynx normal Oral cavity: MMM, no lesions Neck: supple, no lymphadenopathy, no thyromegaly, no masses Heart: RRR, normal S1, S2, no murmurs Lungs: CTA bilaterally, no wheezes, rhonchi, or rales Abdomen: +bs, soft, non tender, non distended, no masses, no hepatomegaly, no splenomegaly Musculoskeletal: nontender, no swelling, no obvious deformity Extremities: no edema, no cyanosis, no clubbing Pulses: 2+ symmetric, upper and lower extremities, normal cap refill Neurological: alert, oriented x 3, CN2-12 intact, strength normal upper extremities and lower extremities, sensation normal throughout, DTRs 2+ throughout, no cerebellar signs, gait normal Psychiatric: normal affect, behavior normal, pleasant  Breast: nontender, no masses or lumps, no skin changes, no nipple discharge or inversion, no  axillary lymphadenopathy Gyn: defer Rectal: defer  Medicare Attestation I have personally reviewed: The patient's medical and social history Their use of alcohol, tobacco or illicit drugs Their current medications and supplements The patient's functional ability including ADLs,fall risks, home safety risks, cognitive, and hearing and visual impairment Diet and physical activities Evidence for depression or mood disorders  The patient's weight, height, BMI, and visual acuity have been recorded in the chart.  I have made referrals, counseling, and provided education to the patient based on review of the above and I have provided the patient with a written personalized care plan for preventive services.     Vicie Mutters, PA-C   02/01/2014

## 2014-02-02 LAB — URINALYSIS, ROUTINE W REFLEX MICROSCOPIC
Bilirubin Urine: NEGATIVE
Glucose, UA: NEGATIVE mg/dL
HGB URINE DIPSTICK: NEGATIVE
Ketones, ur: NEGATIVE mg/dL
LEUKOCYTES UA: NEGATIVE
Nitrite: NEGATIVE
PH: 7 (ref 5.0–8.0)
Protein, ur: NEGATIVE mg/dL
Specific Gravity, Urine: 1.021 (ref 1.005–1.030)
Urobilinogen, UA: 0.2 mg/dL (ref 0.0–1.0)

## 2014-02-02 LAB — LIPID PANEL
CHOL/HDL RATIO: 3.3 ratio
CHOLESTEROL: 197 mg/dL (ref 0–200)
HDL: 59 mg/dL (ref >39)
LDL Cholesterol: 95 mg/dL (ref 0–99)
TRIGLYCERIDES: 214 mg/dL — AB (ref <150)
VLDL: 43 mg/dL — ABNORMAL HIGH (ref 0–40)

## 2014-02-02 LAB — TSH: TSH: 0.795 u[IU]/mL (ref 0.350–4.500)

## 2014-02-02 LAB — BASIC METABOLIC PANEL WITH GFR
BUN: 15 mg/dL (ref 6–23)
CHLORIDE: 103 meq/L (ref 96–112)
CO2: 30 meq/L (ref 19–32)
Calcium: 9.8 mg/dL (ref 8.4–10.5)
Creat: 0.64 mg/dL (ref 0.50–1.10)
GFR, Est African American: >89 mL/min
GFR, Est Non African American: >89 mL/min
GLUCOSE: 87 mg/dL (ref 70–99)
POTASSIUM: 4.5 meq/L (ref 3.5–5.3)
SODIUM: 139 meq/L (ref 135–145)

## 2014-02-02 LAB — VITAMIN D 25 HYDROXY (VIT D DEFICIENCY, FRACTURES): VIT D 25 HYDROXY: 77 ng/mL (ref 30–89)

## 2014-02-02 LAB — HEPATIC FUNCTION PANEL
ALT: 14 U/L (ref 0–35)
AST: 18 U/L (ref 0–37)
Albumin: 4.4 g/dL (ref 3.5–5.2)
Alkaline Phosphatase: 69 U/L (ref 39–117)
Total Bilirubin: 0.3 mg/dL (ref 0.2–1.2)
Total Protein: 6.6 g/dL (ref 6.0–8.3)

## 2014-02-02 LAB — INSULIN, FASTING: Insulin fasting, serum: 18 u[IU]/mL (ref 3–28)

## 2014-02-02 LAB — MAGNESIUM: Magnesium: 1.9 mg/dL (ref 1.5–2.5)

## 2014-02-02 LAB — MICROALBUMIN / CREATININE URINE RATIO
Creatinine, Urine: 85.8 mg/dL
MICROALB UR: 0.57 mg/dL (ref 0.00–1.89)
MICROALB/CREAT RATIO: 6.6 mg/g (ref 0.0–30.0)

## 2014-03-21 ENCOUNTER — Other Ambulatory Visit: Payer: Self-pay | Admitting: *Deleted

## 2014-03-21 MED ORDER — MONTELUKAST SODIUM 10 MG PO TABS: 10.0000 mg | ORAL_TABLET | Freq: Every day | ORAL | Status: DC

## 2014-04-29 ENCOUNTER — Telehealth: Payer: Self-pay | Admitting: Internal Medicine

## 2014-04-29 NOTE — Telephone Encounter (Signed)
NEW PHARM:  RX RENEWAL  verapamil (VERELAN PM) 240 MG 24 hr TABLETS  [facility] [phone]  Thank you, Katrina Judeth Horn [facility] [phone] Fax [phone]

## 2014-05-02 ENCOUNTER — Other Ambulatory Visit: Payer: Self-pay | Admitting: *Deleted

## 2014-05-02 MED ORDER — VERAPAMIL HCL ER 240 MG PO CP24: 240.0000 mg | ORAL_CAPSULE | Freq: Every day | ORAL | Status: DC

## 2014-05-18 ENCOUNTER — Other Ambulatory Visit: Payer: Self-pay | Admitting: Physician Assistant

## 2014-06-29 ENCOUNTER — Other Ambulatory Visit: Payer: Self-pay | Admitting: *Deleted

## 2014-06-29 MED ORDER — OMEPRAZOLE 20 MG PO CPDR: 20.0000 mg | DELAYED_RELEASE_CAPSULE | Freq: Every day | ORAL | Status: DC

## 2014-07-14 ENCOUNTER — Encounter: Payer: Self-pay | Admitting: Emergency Medicine

## 2014-07-14 ENCOUNTER — Ambulatory Visit (INDEPENDENT_AMBULATORY_CARE_PROVIDER_SITE_OTHER): Payer: Medicare Other | Admitting: Emergency Medicine

## 2014-07-14 VITALS — BP 134/70 | HR 66 | Temp 98.2°F | Resp 16 | Wt 144.0 lb

## 2014-07-14 DIAGNOSIS — M810 Age-related osteoporosis without current pathological fracture: Secondary | ICD-10-CM

## 2014-07-14 DIAGNOSIS — E782 Mixed hyperlipidemia: Secondary | ICD-10-CM

## 2014-07-14 DIAGNOSIS — I10 Essential (primary) hypertension: Secondary | ICD-10-CM

## 2014-07-14 DIAGNOSIS — Z23 Encounter for immunization: Secondary | ICD-10-CM

## 2014-07-14 LAB — LIPID PANEL
Cholesterol: 196 mg/dL (ref 0–200)
HDL: 63 mg/dL (ref >39)
LDL CALC: 111 mg/dL — AB (ref 0–99)
Total CHOL/HDL Ratio: 3.1 Ratio
Triglycerides: 110 mg/dL (ref <150)
VLDL: 22 mg/dL (ref 0–40)

## 2014-07-14 LAB — HEPATIC FUNCTION PANEL
ALT: 13 U/L (ref 0–35)
AST: 15 U/L (ref 0–37)
Albumin: 4.1 g/dL (ref 3.5–5.2)
Alkaline Phosphatase: 62 U/L (ref 39–117)
BILIRUBIN INDIRECT: 0.3 mg/dL (ref 0.2–1.2)
Bilirubin, Direct: 0.1 mg/dL (ref 0.0–0.3)
Total Bilirubin: 0.4 mg/dL (ref 0.2–1.2)
Total Protein: 6.6 g/dL (ref 6.0–8.3)

## 2014-07-14 LAB — CBC WITH DIFFERENTIAL/PLATELET
Basophils Absolute: 0 10*3/uL (ref 0.0–0.1)
Basophils Relative: 1 % (ref 0–1)
EOS PCT: 4 % (ref 0–5)
Eosinophils Absolute: 0.2 10*3/uL (ref 0.0–0.7)
HCT: 39.6 % (ref 36.0–46.0)
HEMOGLOBIN: 13.6 g/dL (ref 12.0–15.0)
LYMPHS ABS: 1.9 10*3/uL (ref 0.7–4.0)
Lymphocytes Relative: 40 % (ref 12–46)
MCH: 31.5 pg (ref 26.0–34.0)
MCHC: 34.3 g/dL (ref 30.0–36.0)
MCV: 91.7 fL (ref 78.0–100.0)
MONO ABS: 0.4 10*3/uL (ref 0.1–1.0)
Monocytes Relative: 9 % (ref 3–12)
Neutro Abs: 2.2 10*3/uL (ref 1.7–7.7)
Neutrophils Relative %: 46 % (ref 43–77)
Platelets: 279 10*3/uL (ref 150–400)
RBC: 4.32 MIL/uL (ref 3.87–5.11)
RDW: 13.3 % (ref 11.5–15.5)
WBC: 4.8 10*3/uL (ref 4.0–10.5)

## 2014-07-14 LAB — BASIC METABOLIC PANEL WITH GFR
BUN: 15 mg/dL (ref 6–23)
CO2: 25 mEq/L (ref 19–32)
Calcium: 9.7 mg/dL (ref 8.4–10.5)
Chloride: 105 mEq/L (ref 96–112)
Creat: 0.64 mg/dL (ref 0.50–1.10)
GFR, Est African American: >89 mL/min
GFR, Est Non African American: >89 mL/min
Glucose, Bld: 81 mg/dL (ref 70–99)
Potassium: 4.7 mEq/L (ref 3.5–5.3)
SODIUM: 138 meq/L (ref 135–145)

## 2014-07-14 MED ORDER — CALCITONIN (SALMON) 200 UNIT/ACT NA SOLN: 1.0000 | Freq: Every day | NASAL | Status: DC

## 2014-07-14 NOTE — Patient Instructions (Signed)
H1N1 Influenza (swine flu) Vaccine injection What is this medicine? H1N1 INFLUENZA (SWINE FLU) VACCINE (H1N1 in floo EN zuh (swahyn floo) vak SEEN) is a vaccine to protect from an infection with the pandemic H1N1 flu, also known as the swine flu. The vaccine only helps protect you against this one strain of the flu. This vaccine does not help to the reduce the risk of getting other types of flu. You may also need to get the seasonal influenza virus vaccine. This medicine may be used for other purposes; ask your health care provider or pharmacist if you have questions. COMMON BRAND NAME(S): Influenza A (H1N1) 2009 Monovalent Vaccine What should I tell my health care provider before I take this medicine? They need to know if you have any of these conditions: -Guillain-Barre syndrome -immune system problems -an unusual or allergic reaction to influenza vaccine, eggs, neomycin, polymyxin, other medicines, foods, dyes or preservatives -pregnant or trying to get pregnant -breast-feeding How should I use this medicine? This vaccine is for injection into a muscle. It is given by a health care professional. A copy of Vaccine Information Statements will be given before each vaccination. Read this sheet carefully each time. The sheet may change frequently. Talk to your pediatrician regarding the use of this medicine in children. Special care may be needed. While this drug may be prescribed for children as young as 6 months for selected conditions, precautions do apply. Overdosage: If you think you've taken too much of this medicine contact a poison control center or emergency room at once. Overdosage: If you think you have taken too much of this medicine contact a poison control center or emergency room at once. NOTE: This medicine is only for you. Do not share this medicine with others. What if I miss a dose? If needed, keep appointments for follow-up (booster) doses as directed. It is important not to  miss your dose. Call your doctor or health care professional if you are unable to keep an appointment. What may interact with this medicine? -anakinra -medicines for organ transplant -medicines to treat cancer -other vaccines -rilonacept -steroid medicines like prednisone or cortisone -tumor necrosis factor (TNF) modifiers like adalimumab, etanercept, infliximab, golimumab, or certolizumab This list may not describe all possible interactions. Give your health care provider a list of all the medicines, herbs, non-prescription drugs, or dietary supplements you use. Also tell them if you smoke, drink alcohol, or use illegal drugs. Some items may interact with your medicine. What should I watch for while using this medicine? Report any side effects to your doctor right away. This vaccine lowers your risk of getting the pandemic H1N1 flu. You can get a milder H1N1 flu infection if you are around others with this flu. This flu vaccine will not protect against colds or other illnesses including other flu viruses. You may also need the seasonal influenza vaccine. What side effects may I notice from receiving this medicine? Side effects that you should report to your doctor or health care professional as soon as possible: -allergic reactions like skin rash, itching or hives, swelling of the face, lips, or tongue -breathing problems -muscle weakness -unusual drooping or paralysis of face Side effects that usually do not require medical attention (Report these to your doctor or health care professional if they continue or are bothersome.): -chills -cough -headache -muscle aches and pains -runny or stuffy nose -sore throat -stomach upset -tiredness This list may not describe all possible side effects. Call your doctor for medical advice about   side effects. You may report side effects to FDA at 1-800-FDA-1088. Where should I keep my medicine? This vaccine is only given in a clinic, pharmacy,  doctor's office, or other health care setting and will not be stored at home. NOTE: This sheet is a summary. It may not cover all possible information. If you have questions about this medicine, talk to your doctor, pharmacist, or health care provider.  2015, Elsevier/Gold Standard. (2008-07-26 16:49:51) Osteoporosis Osteoporosis happens when your bones become weak because of bone loss. Weak bones can break (fracture) more easily with slips or falls. You are more likely to develop osteoporosis if:  You are a woman.  You are older than 50 years.  You are white or Asian.  You are very thin.  Someone in your family has had osteoporosis.  You smoke or use nicotine. CAUSES   Smoking.  Too much drinking.  Being a weight below normal.  Not being active.  Not going outside in the sun enough.  Certain medical conditions, such as diabetes or Crohn disease.  Certain medicines, such as steroids or antiseizure medicines. TREATMENT  The goal of treatment is to strengthen bones. There are different types of medicines that help your bones. Some medicines make your bones more solid. Some medicines help to slow down how much bone you lose. Your doctor may check to see if you are getting enough calcium and vitamin D in your diet. PREVENTION   Make sure you get enough calcium and vitamin D.  Make sure you exercise often.  If you smoke, quit. MAKE SURE YOU:  Understand these instructions.  Will watch your condition.  Will get help right away if you are not doing well or get worse. Document Released: 11/18/2011 Document Reviewed: 11/18/2011 [facility] Patient Information 2015 [place]. This information is not intended to replace advice given to you by your health care provider. Make sure you discuss any questions you have with your health care provider.

## 2014-07-14 NOTE — Progress Notes (Signed)
Subjective:    Patient ID: Isabel Oliver, female    DOB: [birth year], 73 y.o.   MRN: [id number]  HPI Comments: 73 yo WF presents for 3 month F/U for HTN, Cholesterol. She has not been eating as healthy. She is walking for exercise. She notes BP is good at home.   She was unable to tolerate Fosamax for Osteoporosis due to reflux. She is willing to try different RX. She notes reflux improved when d/c RX.  Lab Results      Component                Value               Date                      WBC                      5.8                 02/01/2014                HGB                      13.0                02/01/2014                HCT                      38.9                02/01/2014                PLT                      281                 02/01/2014                GLUCOSE                  87                  02/01/2014                CHOL                     197                 02/01/2014                TRIG                     214*                02/01/2014                HDL                      59                  02/01/2014                LDLCALC                  95  02/01/2014                ALT                      14                  02/01/2014                AST                      18                  02/01/2014                NA                       139                 02/01/2014                K                        4.5                 02/01/2014                CL                       103                 02/01/2014                CREATININE               0.64                02/01/2014                BUN                      15                  02/01/2014                CO2                      30                  02/01/2014                TSH                      0.795               02/01/2014                HGBA1C                   5.4                 02/01/2014                MICROALBUR               0.57                02/01/2014  Hyperlipidemia Pertinent negatives include no chest pain or shortness of breath.  Hypertension Pertinent negatives include no chest pain or shortness of breath.  Gastrophageal Reflux She reports no chest pain. Associated symptoms include fatigue.     Medication List       This list is accurate as of: 07/14/14 10:34 AM.  Always use your most recent med list.               albuterol 108 (90 BASE) MCG/ACT inhaler  Commonly known as:  PROVENTIL HFA;VENTOLIN HFA  Inhale 1-2 puffs into the lungs every 6 (six) hours as needed for wheezing or shortness of breath.     aspirin 81 MG chewable tablet  Chew 81 mg by mouth daily.     cetirizine 10 MG tablet  Commonly known as:  ZYRTEC  Take 10 mg by mouth daily.     FISH OIL PO  Take by mouth daily.     ipratropium-albuterol 0.5-2.5 (3) MG/3ML Soln  Commonly known as:  DUONEB  Take 3 mLs by nebulization.     montelukast 10 MG tablet  Commonly known as:  SINGULAIR  Take 1 tablet (10 mg total) by mouth at bedtime.     MULTIVITAMIN PO  Take by mouth daily.     omeprazole 20 MG capsule  Commonly known as:  PRILOSEC  Take 1 capsule (20 mg total) by mouth daily.     pravastatin 40 MG tablet  Commonly known as:  PRAVACHOL  take 1 tablet by mouth once daily     PROBIOTIC DAILY PO  Take by mouth daily.     verapamil 240 MG 24 hr capsule  Commonly known as:  VERELAN PM  Take 1 capsule (240 mg total) by mouth at bedtime.     Vitamin D 2000 UNITS Caps  Take by mouth.       Allergies  Allergen Reactions  . Adhesive [Tape] Hives  . Codeine Hives  . Penicillins Hives  . Shellfish Allergy   . Iodine Rash     Medication List       This list is accurate as of: 07/14/14 10:34 AM.  Always use your most recent med list.               albuterol 108 (90 BASE) MCG/ACT inhaler  Commonly known as:  PROVENTIL HFA;VENTOLIN HFA  Inhale 1-2 puffs into the lungs every 6 (six) hours as needed for wheezing or shortness of breath.      aspirin 81 MG chewable tablet  Chew 81 mg by mouth daily.     cetirizine 10 MG tablet  Commonly known as:  ZYRTEC  Take 10 mg by mouth daily.     FISH OIL PO  Take by mouth daily.     ipratropium-albuterol 0.5-2.5 (3) MG/3ML Soln  Commonly known as:  DUONEB  Take 3 mLs by nebulization.     montelukast 10 MG tablet  Commonly known as:  SINGULAIR  Take 1 tablet (10 mg total) by mouth at bedtime.     MULTIVITAMIN PO  Take by mouth daily.     omeprazole 20 MG capsule  Commonly known as:  PRILOSEC  Take 1 capsule (20 mg total) by mouth daily.     pravastatin 40 MG tablet  Commonly known as:  PRAVACHOL  take 1 tablet by mouth once daily     PROBIOTIC DAILY PO  Take by mouth daily.     verapamil 240 MG 24 hr capsule  Commonly known as:  VERELAN PM  Take 1 capsule (240 mg total) by mouth at bedtime.     Vitamin D 2000 UNITS Caps  Take by mouth.       Allergies  Allergen Reactions  . Adhesive [Tape] Hives  . Codeine Hives  . Penicillins Hives  . Shellfish Allergy   . Iodine Rash   Past Medical History  Diagnosis Date  . Hypertension   . Hyperlipidemia   . GERD (gastroesophageal reflux disease)   . Other abnormal glucose   . Anemia   . Asthma   . Osteopenia   . MVP (mitral valve prolapse)   . Goiter       Review of Systems  Constitutional: Positive for fatigue.  Respiratory: Negative for shortness of breath.   Cardiovascular: Negative for chest pain.  All other systems reviewed and are negative.  BP 134/70 mmHg  Pulse 66  Temp(Src) 98.2 F (36.8 C) (Temporal)  Resp 16  Wt 144 lb (65.318 kg)     Objective:   Physical Exam  Constitutional: She is oriented to person, place, and time. She appears well-developed and well-nourished. No distress.  HENT:  Head: Normocephalic and atraumatic.  Right Ear: External ear normal.  Left Ear: External ear normal.  Nose: Nose normal.  Mouth/Throat: Oropharynx is clear and moist.  Eyes: Conjunctivae and  EOM are normal.  Neck: Normal range of motion. Neck supple. No JVD present. No thyromegaly present.  Cardiovascular: Normal rate, regular rhythm, normal heart sounds and intact distal pulses.   Pulmonary/Chest: Effort normal and breath sounds normal.  Abdominal: Soft. Bowel sounds are normal. She exhibits no distension. There is no tenderness. There is no guarding.  Musculoskeletal: Normal range of motion. She exhibits no edema or tenderness.  Lymphadenopathy:    She has no cervical adenopathy.  Neurological: She is alert and oriented to person, place, and time. No cranial nerve deficit.  Skin: Skin is warm and dry. No rash noted. No erythema. No pallor.  Psychiatric: She has a normal mood and affect. Her behavior is normal. Judgment and thought content normal.  Nursing note and vitals reviewed.       Assessment & Plan:  Hypertension, essential - Plan: BASIC METABOLIC PANEL WITH GFR, CBC with Differential  Mixed hyperlipidemia - Plan: Hepatic function panel, Lipid panel  Osteoporosis  Need for prophylactic vaccination and inoculation against influenza - Plan: Flu vaccine HIGH DOSE PF (Fluzone Tri High dose)   Advised will switch Fosamax to miacalcin AD, call with any concerns.  1.  3 month F/U for HTN, Cholesterol. Needs healthy diet, cardio QD and obtain healthy weight. Check Labs, Check BP if >130/80 call office

## 2014-08-15 ENCOUNTER — Other Ambulatory Visit: Payer: Self-pay

## 2014-08-15 MED ORDER — PRAVASTATIN SODIUM 40 MG PO TABS: 40.0000 mg | ORAL_TABLET | Freq: Every day | ORAL | Status: DC

## 2014-08-17 ENCOUNTER — Other Ambulatory Visit (INDEPENDENT_AMBULATORY_CARE_PROVIDER_SITE_OTHER): Payer: Medicare Other

## 2014-08-17 ENCOUNTER — Other Ambulatory Visit: Payer: Self-pay | Admitting: *Deleted

## 2014-08-17 DIAGNOSIS — Z1212 Encounter for screening for malignant neoplasm of rectum: Secondary | ICD-10-CM

## 2014-08-17 LAB — POC HEMOCCULT BLD/STL (HOME/3-CARD/SCREEN)
FECAL OCCULT BLD: NEGATIVE
FECAL OCCULT BLD: NEGATIVE
FECAL OCCULT BLD: NEGATIVE

## 2014-08-17 MED ORDER — ALBUTEROL SULFATE HFA 108 (90 BASE) MCG/ACT IN AERS: 1.0000 | INHALATION_SPRAY | Freq: Four times a day (QID) | RESPIRATORY_TRACT | Status: DC | PRN

## 2014-08-24 ENCOUNTER — Other Ambulatory Visit: Payer: Self-pay | Admitting: Physician Assistant

## 2014-10-04 ENCOUNTER — Other Ambulatory Visit: Payer: Self-pay | Admitting: *Deleted

## 2014-10-04 MED ORDER — VERAPAMIL HCL 120 MG PO TABS
120.0000 mg | ORAL_TABLET | Freq: Two times a day (BID) | ORAL | Status: DC
Start: 2014-10-04 — End: 2015-04-01

## 2014-11-01 ENCOUNTER — Ambulatory Visit: Payer: Self-pay | Admitting: Internal Medicine

## 2014-12-05 ENCOUNTER — Encounter: Payer: Self-pay | Admitting: Internal Medicine

## 2014-12-05 ENCOUNTER — Ambulatory Visit (INDEPENDENT_AMBULATORY_CARE_PROVIDER_SITE_OTHER): Payer: Medicare Other | Admitting: Internal Medicine

## 2014-12-05 VITALS — BP 136/76 | HR 76 | Temp 97.0°F | Resp 16 | Ht 64.5 in | Wt 145.6 lb

## 2014-12-05 DIAGNOSIS — E785 Hyperlipidemia, unspecified: Secondary | ICD-10-CM

## 2014-12-05 DIAGNOSIS — Z23 Encounter for immunization: Secondary | ICD-10-CM | POA: Insufficient documentation

## 2014-12-05 DIAGNOSIS — M1990 Unspecified osteoarthritis, unspecified site: Secondary | ICD-10-CM

## 2014-12-05 DIAGNOSIS — E559 Vitamin D deficiency, unspecified: Secondary | ICD-10-CM

## 2014-12-05 DIAGNOSIS — R350 Frequency of micturition: Secondary | ICD-10-CM

## 2014-12-05 DIAGNOSIS — Z79899 Other long term (current) drug therapy: Secondary | ICD-10-CM

## 2014-12-05 DIAGNOSIS — R739 Hyperglycemia, unspecified: Secondary | ICD-10-CM

## 2014-12-05 DIAGNOSIS — I10 Essential (primary) hypertension: Secondary | ICD-10-CM

## 2014-12-05 LAB — CBC WITH DIFFERENTIAL/PLATELET
BASOS PCT: 1 % (ref 0–1)
Basophils Absolute: 0.1 10*3/uL (ref 0.0–0.1)
EOS ABS: 0.2 10*3/uL (ref 0.0–0.7)
Eosinophils Relative: 4 % (ref 0–5)
HCT: 40.5 % (ref 36.0–46.0)
Hemoglobin: 13.5 g/dL (ref 12.0–15.0)
Lymphocytes Relative: 32 % (ref 12–46)
Lymphs Abs: 1.7 10*3/uL (ref 0.7–4.0)
MCH: 31 pg (ref 26.0–34.0)
MCHC: 33.3 g/dL (ref 30.0–36.0)
MCV: 93.1 fL (ref 78.0–100.0)
MPV: 8.6 fL (ref 8.6–12.4)
Monocytes Absolute: 0.6 10*3/uL (ref 0.1–1.0)
Monocytes Relative: 11 % (ref 3–12)
NEUTROS PCT: 52 % (ref 43–77)
Neutro Abs: 2.8 10*3/uL (ref 1.7–7.7)
PLATELETS: 290 10*3/uL (ref 150–400)
RBC: 4.35 MIL/uL (ref 3.87–5.11)
RDW: 12.5 % (ref 11.5–15.5)
WBC: 5.3 10*3/uL (ref 4.0–10.5)

## 2014-12-05 LAB — HEMOGLOBIN A1C
Hgb A1c MFr Bld: 5.8 % — ABNORMAL HIGH (ref <5.7)
Mean Plasma Glucose: 120 mg/dL — ABNORMAL HIGH (ref <117)

## 2014-12-05 MED ORDER — DICLOFENAC SODIUM 1 % TD GEL: 2.0000 g | Freq: Four times a day (QID) | TRANSDERMAL | Status: DC

## 2014-12-05 NOTE — Progress Notes (Signed)
Patient ID: Zaide Mcclenahan, female   DOB: Jul 15, 1941, 74 y.o.   MRN: [id number]  Assessment and Plan:   1. Essential hypertension -BP good today - check at home -Dash diet - TSH  2. Elevated blood sugar level -Cont diet and exercise - Hemoglobin A1c - Insulin, fasting  3. Hyperlipidemia -cont pravastatin - Lipid panel  4. Vitamin D deficiency -cont supplement - Vit D  25 hydroxy (rtn osteoporosis monitoring)  5. Medication management  - CBC with Differential/Platelet - BASIC METABOLIC PANEL WITH GFR - Hepatic function panel - Magnesium  6. Arthritis - diclofenac sodium (VOLTAREN) 1 % GEL; Apply 2 g topically 4 (four) times daily.  Dispense: 100 g; Refill: 2  7. Urinary frequency -will wait on UA for abx.  Will call something in if positive.   - Urinalysis, Routine w reflex microscopic - Culture, Urine     Continue diet and meds as discussed. Further disposition pending results of labs.  HPI 74 y.o. female  presents for 3 month follow up with hypertension, hyperlipidemia, prediabetes and vitamin D.   Her blood pressure has been controlled at home, today their BP is BP: 136/76 mmHg.   She does not workout.  But she is walking  A lot for her job at [employer].  She denies chest pain, shortness of breath, dizziness.   She is on cholesterol medication and denies myalgias. Her cholesterol is at goal. The cholesterol last visit was:   Lab Results  Component Value Date   CHOL 196 07/14/2014   HDL 63 07/14/2014   LDLCALC 111* 07/14/2014   TRIG 110 07/14/2014   CHOLHDL 3.1 07/14/2014     She has been working on diet and exercise for prediabetes, and denies foot ulcerations, hyperglycemia, hypoglycemia , increased appetite, nausea, paresthesia of the feet, polydipsia, polyuria, visual disturbances, vomiting and weight loss. Last A1C in the office was:  Lab Results  Component Value Date   HGBA1C 5.4 02/01/2014    Patient is on Vitamin D supplement.  Lab Results   Component Value Date   VD25OH 77 02/01/2014      She does report some urgency and frequency.  She reports no dysuria.  She reports no hematuria.  She reports no recent UTI.  She reports no abdominal pain or pressure.  She reports that she has been wearing a pad.  She reports some urge incontinence.    Current Medications:  Current Outpatient Prescriptions on File Prior to Visit  Medication Sig Dispense Refill  . albuterol (PROVENTIL HFA;VENTOLIN HFA) 108 (90 BASE) MCG/ACT inhaler Inhale 1-2 puffs into the lungs every 6 (six) hours as needed for wheezing or shortness of breath. 18 g 3  . aspirin 81 MG chewable tablet Chew 81 mg by mouth daily.    . cetirizine (ZYRTEC) 10 MG tablet Take 10 mg by mouth daily.    . Cholecalciferol (VITAMIN D) 2000 UNITS CAPS Take by mouth.    Marland Kitchen ipratropium-albuterol (DUONEB) 0.5-2.5 (3) MG/3ML SOLN Take 3 mLs by nebulization.    . montelukast (SINGULAIR) 10 MG tablet Take 1 tablet (10 mg total) by mouth at bedtime. 90 tablet 3  . Multiple Vitamins-Minerals (MULTIVITAMIN PO) Take by mouth daily.    . Omega-3 Fatty Acids (FISH OIL PO) Take by mouth daily.    Marland Kitchen omeprazole (PRILOSEC) 20 MG capsule Take 1 capsule (20 mg total) by mouth daily. 90 capsule 1  . pravastatin (PRAVACHOL) 40 MG tablet Take 1 tablet (40 mg total) by mouth daily. [place]  tablet prn  . Probiotic Product (PROBIOTIC DAILY PO) Take by mouth daily.    . verapamil (CALAN) 120 MG tablet Take 1 tablet (120 mg total) by mouth 2 (two) times daily. 180 tablet 1  . calcitonin, salmon, (MIACALCIN/FORTICAL) 200 UNIT/ACT nasal spray Place 1 spray into alternate nostrils daily. (Patient not taking: Reported on 12/05/2014) 3.7 mL 12   No current facility-administered medications on file prior to visit.    Medical History:  Past Medical History  Diagnosis Date  . Hypertension   . Hyperlipidemia   . GERD (gastroesophageal reflux disease)   . Other abnormal glucose   . Anemia   . Asthma   . Osteopenia   .  MVP (mitral valve prolapse)   . Goiter     Allergies:  Allergies  Allergen Reactions  . Adhesive [Tape] Hives  . Codeine Hives  . Fosamax [Alendronate Sodium] Other (See Comments)    Reflux increase  . Penicillins Hives  . Shellfish Allergy   . Iodine Rash     Review of Systems:  Review of Systems  Constitutional: Negative for fever, chills and malaise/fatigue.  HENT: Negative for ear pain, nosebleeds, sore throat and tinnitus.   Eyes: Negative.   Respiratory: Negative for cough, sputum production and shortness of breath.   Cardiovascular: Negative for chest pain, palpitations, claudication, leg swelling and PND.  Gastrointestinal: Negative for heartburn, nausea, vomiting, diarrhea, constipation, blood in stool and melena.  Genitourinary: Positive for urgency and frequency. Negative for dysuria, hematuria and flank pain.  Musculoskeletal: Negative.   Skin: Negative.   Neurological: Negative.  Negative for headaches.    Family history- Review and unchanged  Social history- Review and unchanged  Physical Exam: BP 136/76 mmHg  Pulse 76  Temp(Src) 97 F (36.1 C)  Resp 16  Ht 5' 4.5" (1.638 m)  Wt 145 lb 9.6 oz (66.044 kg)  BMI 24.62 kg/m2 Wt Readings from Last 3 Encounters:  12/05/14 145 lb 9.6 oz (66.044 kg)  07/14/14 144 lb (65.318 kg)  09/16/13 141 lb (63.957 kg)    General Appearance: Well nourished well developed, in no apparent distress. Eyes: PERRLA, EOMs, conjunctiva no swelling or erythema ENT/Mouth: Ear canals normal without obstruction, swelling, erythma, discharge.  TMs normal bilaterally.  Oropharynx moist, clear, without exudate, or postoropharyngeal swelling. Neck: Supple, thyroid normal,no cervical adenopathy  Respiratory: Respiratory effort normal, Breath sounds clear A&P without rhonchi, wheeze, or rale.  No retractions, no accessory usage. Cardio: RRR with no MRGs. Brisk peripheral pulses without edema.  Abdomen: Soft, + BS,  Non tender, no  guarding, rebound, hernias, masses.  No CVA tenderness bilaterally.   Musculoskeletal: Full ROM, 5/5 strength, Normal gait Skin: Warm, dry without rashes, lesions, ecchymosis.  Neuro: Awake and oriented X 3, Cranial nerves intact. Normal muscle tone, no cerebellar symptoms. Psych: Normal affect, Insight and Judgment appropriate.    FORCUCCI, Gyneth Hubka, PA-C 10:58 AM [facility]

## 2014-12-05 NOTE — Patient Instructions (Signed)
[email or web address] for prescriptions  Nexium/protonix/prilosec are called PPI's, they are great at healing your stomach but should only be taken for a short period of time.   Studies are showing that taken for a long time it can increase the risk of osteoporosis (weakening of your bones), pneumonia, low magnesium, restless legs, Cdiff (infection that causes diarrhea), and most recently kidney disease/insufficiency.  Due to this information we want to try to stop the PPI but if you try to stop it abruptly this can cause rebound acid and worsening symptoms.   So this is how we want you to get off the PPI: - Start taking the nexium/protonix/prilosec/PPI  every other day with pepcid or zantac (generic is fine) 2 x a day for 2 weeks - then decrease the PPI to every 3 days while taking the zantac or pepcid twice a day the other 2 days for 2 weeks - then you can try the zantac or pepcid once at night for 2 weeks - you can continue on this once at night or stop all together - Avoid alcohol, spicy foods, NSAIDS (aleve, ibuprofen) at this time. See foods below.   Food Choices for Gastroesophageal Reflux Disease When you have gastroesophageal reflux disease (GERD), the foods you eat and your eating habits are very important. Choosing the right foods can help ease the discomfort of GERD. WHAT GENERAL GUIDELINES DO I NEED TO FOLLOW?  Choose fruits, vegetables, whole grains, low-fat dairy products, and low-fat meat, fish, and poultry.  Limit fats such as oils, salad dressings, butter, nuts, and avocado.  Keep a food diary to identify foods that cause symptoms.  Avoid foods that cause reflux. These may be different for different people.  Eat frequent small meals instead of three large meals each day.  Eat your meals slowly, in a relaxed setting.  Limit fried foods.  Cook foods using methods other than frying.  Avoid drinking alcohol.  Avoid drinking large amounts of liquids with your meals.  Avoid  bending over or lying down until 2-3 hours after eating. WHAT FOODS ARE NOT RECOMMENDED? The following are some foods and drinks that may worsen your symptoms: Vegetables Tomatoes. Tomato juice. Tomato and spaghetti sauce. Chili peppers. Onion and garlic. Horseradish. Fruits Oranges, grapefruit, and lemon (fruit and juice). Meats High-fat meats, fish, and poultry. This includes hot dogs, ribs, ham, sausage, salami, and bacon. Dairy Whole milk and chocolate milk. Sour cream. Cream. Butter. Ice cream. Cream cheese.  Beverages Coffee and tea, with or without caffeine. Carbonated beverages or energy drinks. Condiments Hot sauce. Barbecue sauce.  Sweets/Desserts Chocolate and cocoa. Donuts. Peppermint and spearmint. Fats and Oils High-fat foods, including [place] fries and potato chips. Other Vinegar. Strong spices, such as black pepper, white pepper, red pepper, cayenne, curry powder, cloves, ginger, and chili powder. Nexium/protonix/prilosec are called PPI's, they are great at healing your stomach but should only be taken for a short period of time.

## 2014-12-06 ENCOUNTER — Other Ambulatory Visit: Payer: Self-pay | Admitting: Internal Medicine

## 2014-12-06 LAB — URINALYSIS, MICROSCOPIC ONLY
CASTS: NONE SEEN
CRYSTALS: NONE SEEN
SQUAMOUS EPITHELIAL / LPF: NONE SEEN

## 2014-12-06 LAB — LIPID PANEL
CHOL/HDL RATIO: 3.6 ratio
CHOLESTEROL: 191 mg/dL (ref 0–200)
HDL: 53 mg/dL (ref >=46)
LDL Cholesterol: 107 mg/dL — ABNORMAL HIGH (ref 0–99)
Triglycerides: 156 mg/dL — ABNORMAL HIGH (ref <150)
VLDL: 31 mg/dL (ref 0–40)

## 2014-12-06 LAB — HEPATIC FUNCTION PANEL
ALBUMIN: 3.9 g/dL (ref 3.5–5.2)
ALT: 14 U/L (ref 0–35)
AST: 19 U/L (ref 0–37)
Alkaline Phosphatase: 69 U/L (ref 39–117)
BILIRUBIN TOTAL: 0.3 mg/dL (ref 0.2–1.2)
Bilirubin, Direct: 0.1 mg/dL (ref 0.0–0.3)
Indirect Bilirubin: 0.2 mg/dL (ref 0.2–1.2)
Total Protein: 6.3 g/dL (ref 6.0–8.3)

## 2014-12-06 LAB — URINALYSIS, ROUTINE W REFLEX MICROSCOPIC
BILIRUBIN URINE: NEGATIVE
Glucose, UA: NEGATIVE mg/dL
Hgb urine dipstick: NEGATIVE
KETONES UR: NEGATIVE mg/dL
NITRITE: NEGATIVE
Protein, ur: NEGATIVE mg/dL
SPECIFIC GRAVITY, URINE: 1.018 (ref 1.005–1.030)
UROBILINOGEN UA: 0.2 mg/dL (ref 0.0–1.0)
pH: 6 (ref 5.0–8.0)

## 2014-12-06 LAB — BASIC METABOLIC PANEL WITH GFR
BUN: 18 mg/dL (ref 6–23)
CALCIUM: 9.6 mg/dL (ref 8.4–10.5)
CO2: 28 meq/L (ref 19–32)
CREATININE: 0.66 mg/dL (ref 0.50–1.10)
Chloride: 104 mEq/L (ref 96–112)
GFR, Est African American: >89 mL/min
GFR, Est Non African American: 89 mL/min
GLUCOSE: 68 mg/dL — AB (ref 70–99)
Potassium: 4.6 mEq/L (ref 3.5–5.3)
Sodium: 140 mEq/L (ref 135–145)

## 2014-12-06 LAB — TSH: TSH: 0.856 u[IU]/mL (ref 0.350–4.500)

## 2014-12-06 LAB — INSULIN, FASTING: Insulin fasting, serum: 17.2 u[IU]/mL (ref 2.0–19.6)

## 2014-12-06 LAB — VITAMIN D 25 HYDROXY (VIT D DEFICIENCY, FRACTURES): Vit D, 25-Hydroxy: 52 ng/mL (ref 30–100)

## 2014-12-06 LAB — MAGNESIUM: MAGNESIUM: 1.9 mg/dL (ref 1.5–2.5)

## 2014-12-06 MED ORDER — CEPHALEXIN 500 MG PO CAPS: 500.0000 mg | ORAL_CAPSULE | Freq: Three times a day (TID) | ORAL | Status: DC

## 2014-12-06 MED ORDER — CIPROFLOXACIN HCL 500 MG PO TABS: 500.0000 mg | ORAL_TABLET | Freq: Two times a day (BID) | ORAL | Status: DC

## 2014-12-06 NOTE — Progress Notes (Signed)
Patient's spouse aware that ABX was sent to pharmacy.

## 2014-12-08 LAB — URINE CULTURE

## 2014-12-14 ENCOUNTER — Other Ambulatory Visit: Payer: Self-pay | Admitting: Internal Medicine

## 2014-12-14 DIAGNOSIS — Z1231 Encounter for screening mammogram for malignant neoplasm of breast: Secondary | ICD-10-CM

## 2014-12-16 ENCOUNTER — Other Ambulatory Visit: Payer: Self-pay

## 2014-12-16 DIAGNOSIS — M1990 Unspecified osteoarthritis, unspecified site: Secondary | ICD-10-CM

## 2014-12-16 MED ORDER — DICLOFENAC SODIUM 1 % TD GEL: 2.0000 g | Freq: Four times a day (QID) | TRANSDERMAL | Status: DC

## 2015-01-12 ENCOUNTER — Ambulatory Visit (HOSPITAL_COMMUNITY)
Admission: RE | Admit: 2015-01-12 | Discharge: 2015-01-12 | Disposition: A | Discharge status: Home / Self Care | Payer: Medicare Other | Source: Ambulatory Visit | Attending: Internal Medicine | Admitting: Internal Medicine

## 2015-01-12 ENCOUNTER — Ambulatory Visit (INDEPENDENT_AMBULATORY_CARE_PROVIDER_SITE_OTHER): Payer: Medicare Other | Admitting: *Deleted

## 2015-01-12 DIAGNOSIS — Z1231 Encounter for screening mammogram for malignant neoplasm of breast: Secondary | ICD-10-CM

## 2015-01-12 DIAGNOSIS — N390 Urinary tract infection, site not specified: Secondary | ICD-10-CM

## 2015-01-12 NOTE — Progress Notes (Signed)
Patient ID: Isabel Oliver, female   DOB: April 19, 1941, 74 y.o.   MRN: [id number] Patient presents for 6 week recheck UA, C&S. Patient completed Cipro abx and denies any current symptoms.

## 2015-01-13 LAB — URINE CULTURE
Colony Count: NO GROWTH
Organism ID, Bacteria: NO GROWTH

## 2015-01-13 LAB — URINALYSIS, ROUTINE W REFLEX MICROSCOPIC
BILIRUBIN URINE: NEGATIVE
Glucose, UA: NEGATIVE mg/dL
Hgb urine dipstick: NEGATIVE
KETONES UR: NEGATIVE mg/dL
LEUKOCYTES UA: NEGATIVE
NITRITE: NEGATIVE
PROTEIN: NEGATIVE mg/dL
Specific Gravity, Urine: <1.005 — ABNORMAL LOW (ref 1.005–1.030)
UROBILINOGEN UA: 0.2 mg/dL (ref 0.0–1.0)
pH: 5.5 (ref 5.0–8.0)

## 2015-02-03 ENCOUNTER — Encounter: Payer: Self-pay | Admitting: Physician Assistant

## 2015-02-07 ENCOUNTER — Encounter: Payer: Self-pay | Admitting: Physician Assistant

## 2015-02-16 ENCOUNTER — Other Ambulatory Visit: Payer: Self-pay | Admitting: Internal Medicine

## 2015-03-07 ENCOUNTER — Other Ambulatory Visit: Payer: Self-pay | Admitting: Internal Medicine

## 2015-03-29 ENCOUNTER — Encounter: Payer: Self-pay | Admitting: Physician Assistant

## 2015-03-29 DIAGNOSIS — Z7689 Persons encountering health services in other specified circumstances: Secondary | ICD-10-CM | POA: Insufficient documentation

## 2015-03-29 DIAGNOSIS — Z0000 Encounter for general adult medical examination without abnormal findings: Secondary | ICD-10-CM | POA: Insufficient documentation

## 2015-04-01 ENCOUNTER — Other Ambulatory Visit: Payer: Self-pay | Admitting: Internal Medicine

## 2015-04-11 ENCOUNTER — Ambulatory Visit (INDEPENDENT_AMBULATORY_CARE_PROVIDER_SITE_OTHER): Payer: Medicare Other | Admitting: Physician Assistant

## 2015-04-11 ENCOUNTER — Encounter: Payer: Self-pay | Admitting: Physician Assistant

## 2015-04-11 VITALS — BP 120/72 | HR 72 | Temp 97.7°F | Resp 16 | Ht 64.5 in | Wt 145.0 lb

## 2015-04-11 DIAGNOSIS — E559 Vitamin D deficiency, unspecified: Secondary | ICD-10-CM

## 2015-04-11 DIAGNOSIS — R739 Hyperglycemia, unspecified: Secondary | ICD-10-CM

## 2015-04-11 DIAGNOSIS — Z6824 Body mass index (BMI) 24.0-24.9, adult: Secondary | ICD-10-CM

## 2015-04-11 DIAGNOSIS — I341 Nonrheumatic mitral (valve) prolapse: Secondary | ICD-10-CM

## 2015-04-11 DIAGNOSIS — Z1331 Encounter for screening for depression: Secondary | ICD-10-CM

## 2015-04-11 DIAGNOSIS — J45909 Unspecified asthma, uncomplicated: Secondary | ICD-10-CM

## 2015-04-11 DIAGNOSIS — D649 Anemia, unspecified: Secondary | ICD-10-CM

## 2015-04-11 DIAGNOSIS — Z0001 Encounter for general adult medical examination with abnormal findings: Secondary | ICD-10-CM

## 2015-04-11 DIAGNOSIS — R6889 Other general symptoms and signs: Secondary | ICD-10-CM

## 2015-04-11 DIAGNOSIS — Z0000 Encounter for general adult medical examination without abnormal findings: Secondary | ICD-10-CM

## 2015-04-11 DIAGNOSIS — Z79899 Other long term (current) drug therapy: Secondary | ICD-10-CM

## 2015-04-11 DIAGNOSIS — Z9181 History of falling: Secondary | ICD-10-CM

## 2015-04-11 DIAGNOSIS — M810 Age-related osteoporosis without current pathological fracture: Secondary | ICD-10-CM

## 2015-04-11 DIAGNOSIS — E785 Hyperlipidemia, unspecified: Secondary | ICD-10-CM

## 2015-04-11 DIAGNOSIS — I10 Essential (primary) hypertension: Secondary | ICD-10-CM | POA: Diagnosis not present

## 2015-04-11 LAB — CBC WITH DIFFERENTIAL/PLATELET
BASOS PCT: 1 % (ref 0–1)
Basophils Absolute: 0.1 10*3/uL (ref 0.0–0.1)
EOS ABS: 0.2 10*3/uL (ref 0.0–0.7)
Eosinophils Relative: 4 % (ref 0–5)
HEMATOCRIT: 41.3 % (ref 36.0–46.0)
Hemoglobin: 14 g/dL (ref 12.0–15.0)
LYMPHS ABS: 1.7 10*3/uL (ref 0.7–4.0)
Lymphocytes Relative: 30 % (ref 12–46)
MCH: 31.8 pg (ref 26.0–34.0)
MCHC: 33.9 g/dL (ref 30.0–36.0)
MCV: 93.9 fL (ref 78.0–100.0)
MONO ABS: 0.5 10*3/uL (ref 0.1–1.0)
MPV: 8.6 fL (ref 8.6–12.4)
Monocytes Relative: 9 % (ref 3–12)
NEUTROS ABS: 3.2 10*3/uL (ref 1.7–7.7)
Neutrophils Relative %: 56 % (ref 43–77)
Platelets: 275 10*3/uL (ref 150–400)
RBC: 4.4 MIL/uL (ref 3.87–5.11)
RDW: 13.1 % (ref 11.5–15.5)
WBC: 5.7 10*3/uL (ref 4.0–10.5)

## 2015-04-11 LAB — VITAMIN B12: VITAMIN B 12: 991 pg/mL — AB (ref 211–911)

## 2015-04-11 LAB — HEMOGLOBIN A1C
Hgb A1c MFr Bld: 5.1 % (ref <5.7)
Mean Plasma Glucose: 100 mg/dL (ref <117)

## 2015-04-11 LAB — FERRITIN: FERRITIN: 54 ng/mL (ref 10–291)

## 2015-04-11 LAB — IRON AND TIBC
%SAT: 44 % (ref 20–55)
Iron: 127 ug/dL (ref 42–145)
TIBC: 288 ug/dL (ref 250–470)
UIBC: 161 ug/dL (ref 125–400)

## 2015-04-11 NOTE — Addendum Note (Signed)
Addended by: Vicie Mutters R on: 04/11/2015 10:38 AM   Modules accepted: Miquel Dunn

## 2015-04-11 NOTE — Addendum Note (Signed)
Addended by: Vicie Mutters R on: 04/11/2015 10:36 AM   Modules accepted: Miquel Dunn

## 2015-04-11 NOTE — Patient Instructions (Signed)
Preventive Care for Adults A healthy lifestyle and preventive care can promote health and wellness. Preventive health guidelines for women include the following key practices.  A routine yearly physical is a good way to check with your health care provider about your health and preventive screening. It is a chance to share any concerns and updates on your health and to receive a thorough exam.  Visit your dentist for a routine exam and preventive care every 6 months. Brush your teeth twice a day and floss once a day. Good oral hygiene prevents tooth decay and gum disease.  The frequency of eye exams is based on your age, health, family medical history, use of contact lenses, and other factors. Follow your health care provider's recommendations for frequency of eye exams.  Eat a healthy diet. Foods like vegetables, fruits, whole grains, low-fat dairy products, and lean protein foods contain the nutrients you need without too many calories. Decrease your intake of foods high in solid fats, added sugars, and salt. Eat the right amount of calories for you.Get information about a proper diet from your health care provider, if necessary.  Regular physical exercise is one of the most important things you can do for your health. Most adults should get at least 150 minutes of moderate-intensity exercise (any activity that increases your heart rate and causes you to sweat) each week. In addition, most adults need muscle-strengthening exercises on 2 or more days a week.  Maintain a healthy weight. The body mass index (BMI) is a screening tool to identify possible weight problems. It provides an estimate of body fat based on height and weight. Your health care provider can find your BMI and can help you achieve or maintain a healthy weight.For adults 20 years and older:  A BMI below 18.5 is considered underweight.  A BMI of 18.5 to 24.9 is normal.  A BMI of 25 to 29.9 is considered overweight.  A BMI of  30 and above is considered obese.  Maintain normal blood lipids and cholesterol levels by exercising and minimizing your intake of saturated fat. Eat a balanced diet with plenty of fruit and vegetables. If your lipid or cholesterol levels are high, you are over 50, or you are at high risk for heart disease, you may need your cholesterol levels checked more frequently.Ongoing high lipid and cholesterol levels should be treated with medicines if diet and exercise are not working.  If you smoke, find out from your health care provider how to quit. If you do not use tobacco, do not start.  Lung cancer screening is recommended for adults aged 69-80 years who are at high risk for developing lung cancer because of a history of smoking. A yearly low-dose CT scan of the lungs is recommended for people who have at least a 30-pack-year history of smoking and are a current smoker or have quit within the past 15 years. A pack year of smoking is smoking an average of 1 pack of cigarettes a day for 1 year (for example: 1 pack a day for 30 years or 2 packs a day for 15 years). Yearly screening should continue until the smoker has stopped smoking for at least 15 years. Yearly screening should be stopped for people who develop a health problem that would prevent them from having lung cancer treatment.  Avoid use of street drugs. Do not share needles with anyone. Ask for help if you need support or instructions about stopping the use of drugs.  High blood  pressure causes heart disease and increases the risk of stroke.  Ongoing high blood pressure should be treated with medicines if weight loss and exercise do not work.  If you are 55-79 years old, ask your health care provider if you should take aspirin to prevent strokes.  Diabetes screening involves taking a blood sample to check your fasting blood sugar level. This should be done once every 3 years, after age 45, if you are within normal weight and without risk  factors for diabetes. Testing should be considered at a younger age or be carried out more frequently if you are overweight and have at least 1 risk factor for diabetes.  Breast cancer screening is essential preventive care for women. You should practice "breast self-awareness." This means understanding the normal appearance and feel of your breasts and may include breast self-examination. Any changes detected, no matter how small, should be reported to a health care provider. Women in their 20s and 30s should have a clinical breast exam (CBE) by a health care provider as part of a regular health exam every 1 to 3 years. After age 40, women should have a CBE every year. Starting at age 40, women should consider having a mammogram (breast X-ray test) every year. Women who have a family history of breast cancer should talk to their health care provider about genetic screening. Women at a high risk of breast cancer should talk to their health care providers about having an MRI and a mammogram every year.  Breast cancer gene (BRCA)-related cancer risk assessment is recommended for women who have family members with BRCA-related cancers. BRCA-related cancers include breast, ovarian, tubal, and peritoneal cancers. Having family members with these cancers may be associated with an increased risk for harmful changes (mutations) in the breast cancer genes BRCA1 and BRCA2. Results of the assessment will determine the need for genetic counseling and BRCA1 and BRCA2 testing.  Routine pelvic exams to screen for cancer are no longer recommended for nonpregnant women who are considered low risk for cancer of the pelvic organs (ovaries, uterus, and vagina) and who do not have symptoms. Ask your health care provider if a screening pelvic exam is right for you.  If you have had past treatment for cervical cancer or a condition that could lead to cancer, you need Pap tests and screening for cancer for at least 20 years after  your treatment. If Pap tests have been discontinued, your risk factors (such as having a new sexual partner) need to be reassessed to determine if screening should be resumed. Some women have medical problems that increase the chance of getting cervical cancer. In these cases, your health care provider may recommend more frequent screening and Pap tests.    Colorectal cancer can be detected and often prevented. Most routine colorectal cancer screening begins at the age of 50 years and continues through age 75 years. However, your health care provider may recommend screening at an earlier age if you have risk factors for colon cancer. On a yearly basis, your health care provider may provide home test kits to check for hidden blood in the stool. Use of a small camera at the end of a tube, to directly examine the colon (sigmoidoscopy or colonoscopy), can detect the earliest forms of colorectal cancer. Talk to your health care provider about this at age 50, when routine screening begins. Direct exam of the colon should be repeated every 5-10 years through age 75 years, unless early forms of pre-cancerous polyps   or small growths are found.  Osteoporosis is a disease in which the bones lose minerals and strength with aging. This can result in serious bone fractures or breaks. The risk of osteoporosis can be identified using a bone density scan. Women ages 75 years and over and women at risk for fractures or osteoporosis should discuss screening with their health care providers. Ask your health care provider whether you should take a calcium supplement or vitamin D to reduce the rate of osteoporosis.  Menopause can be associated with physical symptoms and risks. Hormone replacement therapy is available to decrease symptoms and risks. You should talk to your health care provider about whether hormone replacement therapy is right for you.  Use sunscreen. Apply sunscreen liberally and repeatedly throughout the day.  You should seek shade when your shadow is shorter than you. Protect yourself by wearing long sleeves, pants, a wide-brimmed hat, and sunglasses year round, whenever you are outdoors.  Once a month, do a whole body skin exam, using a mirror to look at the skin on your back. Tell your health care provider of new moles, moles that have irregular borders, moles that are larger than a pencil eraser, or moles that have changed in shape or color.  Stay current with required vaccines (immunizations).  Influenza vaccine. All adults should be immunized every year.  Tetanus, diphtheria, and acellular pertussis (Td, Tdap) vaccine. Pregnant women should receive 1 dose of Tdap vaccine during each pregnancy. The dose should be obtained regardless of the length of time since the last dose. Immunization is preferred during the 27th-36th week of gestation. An adult who has not previously received Tdap or who does not know her vaccine status should receive 1 dose of Tdap. This initial dose should be followed by tetanus and diphtheria toxoids (Td) booster doses every 10 years. Adults with an unknown or incomplete history of completing a 3-dose immunization series with Td-containing vaccines should begin or complete a primary immunization series including a Tdap dose. Adults should receive a Td booster every 10 years.    Zoster vaccine. One dose is recommended for adults aged 55 years or older unless certain conditions are present.    Pneumococcal 13-valent conjugate (PCV13) vaccine. When indicated, a person who is uncertain of her immunization history and has no record of immunization should receive the PCV13 vaccine. An adult aged 35 years or older who has certain medical conditions and has not been previously immunized should receive 1 dose of PCV13 vaccine. This PCV13 should be followed with a dose of pneumococcal polysaccharide (PPSV23) vaccine. The PPSV23 vaccine dose should be obtained at least 8 weeks after the  dose of PCV13 vaccine. An adult aged 56 years or older who has certain medical conditions and previously received 1 or more doses of PPSV23 vaccine should receive 1 dose of PCV13. The PCV13 vaccine dose should be obtained 1 or more years after the last PPSV23 vaccine dose.    Pneumococcal polysaccharide (PPSV23) vaccine. When PCV13 is also indicated, PCV13 should be obtained first. All adults aged 49 years and older should be immunized. An adult younger than age 20 years who has certain medical conditions should be immunized. Any person who resides in a nursing home or long-term care facility should be immunized. An adult smoker should be immunized. People with an immunocompromised condition and certain other conditions should receive both PCV13 and PPSV23 vaccines. People with human immunodeficiency virus (HIV) infection should be immunized as soon as possible after diagnosis. Immunization during  chemotherapy or radiation therapy should be avoided. Routine use of PPSV23 vaccine is not recommended for American Indians, [place], or people younger than 65 years unless there are medical conditions that require PPSV23 vaccine. When indicated, people who have unknown immunization and have no record of immunization should receive PPSV23 vaccine. One-time revaccination 5 years after the first dose of PPSV23 is recommended for people aged 19-64 years who have chronic kidney failure, nephrotic syndrome, asplenia, or immunocompromised conditions. People who received 1-2 doses of PPSV23 before age 68 years should receive another dose of PPSV23 vaccine at age 34 years or later if at least 5 years have passed since the previous dose. Doses of PPSV23 are not needed for people immunized with PPSV23 at or after age 17 years.   Preventive Services / Frequency  Ages 75 years and over  Blood pressure check.  Lipid and cholesterol check.  Lung cancer screening. / Every year if you are aged 7-80 years and have a  30-pack-year history of smoking and currently smoke or have quit within the past 15 years. Yearly screening is stopped once you have quit smoking for at least 15 years or develop a health problem that would prevent you from having lung cancer treatment.  Clinical breast exam.** / Every year after age 58 years.  BRCA-related cancer risk assessment.** / For women who have family members with a BRCA-related cancer (breast, ovarian, tubal, or peritoneal cancers).  Mammogram.** / Every year beginning at age 71 years and continuing for as long as you are in good health. Consult with your health care provider.  Pap test.** / Every 3 years starting at age 30 years through age 54 or 60 years with 3 consecutive normal Pap tests. Testing can be stopped between 65 and 70 years with 3 consecutive normal Pap tests and no abnormal Pap or HPV tests in the past 10 years.  Fecal occult blood test (FOBT) of stool. / Every year beginning at age 75 years and continuing until age 13 years. You may not need to do this test if you get a colonoscopy every 10 years.  Flexible sigmoidoscopy or colonoscopy.** / Every 5 years for a flexible sigmoidoscopy or every 10 years for a colonoscopy beginning at age 51 years and continuing until age 34 years.  Hepatitis C blood test.** / For all people born from [birth year] through 1964 and any individual with known risks for hepatitis C.  Osteoporosis screening.** / A one-time screening for women ages 68 years and over and women at risk for fractures or osteoporosis.  Skin self-exam. / Monthly.  Influenza vaccine. / Every year.  Tetanus, diphtheria, and acellular pertussis (Tdap/Td) vaccine.** / 1 dose of Td every 10 years.  Zoster vaccine.** / 1 dose for adults aged 70 years or older.  Pneumococcal 13-valent conjugate (PCV13) vaccine.** / Consult your health care provider.  Pneumococcal polysaccharide (PPSV23) vaccine.** / 1 dose for all adults aged 58 years and older. Screening  for abdominal aortic aneurysm (AAA)  by ultrasound is recommended for people who have history of high blood pressure or who are current or former smokers.

## 2015-04-11 NOTE — Progress Notes (Signed)
Medicare wellness and OV  Assessment:   1. MVP (mitral valve prolapse) controlled  2. Hypertension - CBC with Differential - BASIC METABOLIC PANEL WITH GFR - Hepatic function panel - TSH - Urinalysis, Routine w reflex microscopic - Microalbumin / creatinine urine ratio - EKG 12-Lead - [place], RETROPERITNL ABD,  LTD  3. GERD (gastroesophageal reflux disease) Cont prilosec  4. Other abnormal glucose Discussed general issues about diabetes pathophysiology and management., Educational material distributed., Suggested low cholesterol diet., Encouraged aerobic exercise., Discussed foot care., Reminded to get yearly retinal exam. - Hemoglobin A1c - Insulin, fasting  5. Hyperlipidemia -continue medications, check lipids, decrease fatty foods, increase activity.  - Lipid panel  6. Anemia Check CBC  7. Osteoporosis, unspecified  DEXA in 2016  8. Unspecified vitamin D deficiency - Vit D  25 hydroxy (rtn osteoporosis monitoring)  9. Encounter for long-term (current) use of other medications - Magnesium   Plan:   During the course of the visit the patient was educated and counseled about appropriate screening and preventive services including:    Pneumococcal vaccine   Influenza vaccine  Td vaccine  Screening electrocardiogram  Screening mammography  Bone densitometry screening  Colorectal cancer screening  Diabetes screening  Glaucoma screening  Nutrition counseling   Conditions/risks identified:  BMI: Discussed weight loss, diet, and increase physical activity.  Increase physical activity: AHA recommends 150 minutes of physical activity a week.  Medications reviewed DEXA- due 2017 Urinary Incontinence is not an issue: discussed non pharmacology and pharmacology options.  Fall risk: low- discussed PT, home fall assessment, medications. Will start on fosamax due to ostroporosis.   Subjective:   Isabel Oliver is a 74 y.o. female who presents for Medicare  Annual Wellness Visit and follow up for chronic illness including HTN, chol, preDM, vitamin D def, osteoporosis.   Date of last medicare wellness visit was 01/2014  Her blood pressure has been controlled at home, today their BP is BP: 120/72 mmHg She does workout, she works at [employer] and walks a lot. She denies chest pain, shortness of breath, dizziness.  She is on cholesterol medication and denies myalgias. Her cholesterol is not at goal. The cholesterol last visit was:   Lab Results  Component Value Date   CHOL 191 12/05/2014   HDL 53 12/05/2014   LDLCALC 107* 12/05/2014   TRIG 156* 12/05/2014   CHOLHDL 3.6 12/05/2014    Last A1C in the office was:  Lab Results  Component Value Date   HGBA1C 5.8* 12/05/2014   Patient is on Vitamin D supplement.   Lab Results  Component Value Date   VD25OH 52 12/05/2014   She has history of osteoporosis, could not tolerate calcitonin or fosamax due to her GERD.   Names of Other Physician/Practitioners you currently use: 1. [facility]- here for primary care 2. Dr. Katy Fitch, eye doctor, last visit 2 years ago, has an appointment in Sept 3. Dr. In Adrian Blackwater, dentist, last visit q 6 months Patient Care Team: Unk Pinto, MD as PCP - General (Internal Medicine) Laurence Spates, MD as Consulting Physician (Gastroenterology) Harle Battiest, MD as Consulting Physician (Obstetrics and Gynecology) Danella Sensing, MD as Consulting Physician (Dermatology)   Medication Review Current Outpatient Prescriptions on File Prior to Visit  Medication Sig Dispense Refill  . albuterol (PROVENTIL HFA;VENTOLIN HFA) 108 (90 BASE) MCG/ACT inhaler Inhale 1-2 puffs into the lungs every 6 (six) hours as needed for wheezing or shortness of breath. 18 g 3  .  aspirin 81 MG chewable tablet Chew 81 mg by mouth daily.    . calcitonin, salmon, (MIACALCIN/FORTICAL) 200 UNIT/ACT nasal spray Place 1 spray into alternate nostrils daily. (Patient  not taking: Reported on 12/05/2014) 3.7 mL 12  . cetirizine (ZYRTEC) 10 MG tablet Take 10 mg by mouth daily.    . Cholecalciferol (VITAMIN D) 2000 UNITS CAPS Take by mouth.    . ciprofloxacin (CIPRO) 500 MG tablet Take 1 tablet (500 mg total) by mouth 2 (two) times daily. 14 tablet 0  . diclofenac sodium (VOLTAREN) 1 % GEL Apply 2 g topically 4 (four) times daily. 100 g 3  . ipratropium-albuterol (DUONEB) 0.5-2.5 (3) MG/3ML SOLN Take 3 mLs by nebulization.    . montelukast (SINGULAIR) 10 MG tablet Take 1 tablet (10 mg total) by mouth at bedtime. 90 tablet 3  . Multiple Vitamins-Minerals (MULTIVITAMIN PO) Take by mouth daily.    . Omega-3 Fatty Acids (FISH OIL PO) Take by mouth daily.    Marland Kitchen omeprazole (PRILOSEC) 20 MG capsule TAKE 1 BY MOUTH DAILY 90 capsule 0  . pravastatin (PRAVACHOL) 40 MG tablet Take 1 tablet (40 mg total) by mouth daily. 90 tablet prn  . Probiotic Product (PROBIOTIC DAILY PO) Take by mouth daily.    . verapamil (CALAN) 120 MG tablet take 1 tablet by mouth twice a day 180 tablet 1   No current facility-administered medications on file prior to visit.    Current Problems (verified) Patient Active Problem List   Diagnosis Date Noted  . Encounter for Medicare annual wellness exam 03/29/2015  . Medication management 12/05/2014  . Vitamin D deficiency 12/05/2014  . Elevated blood sugar level   . MVP (mitral valve prolapse)   . Hypertension   . Hyperlipidemia   . GERD (gastroesophageal reflux disease)   . Anemia   . Asthma   . Goiter     Screening Tests Health Maintenance  Topic Date Due  . INFLUENZA VACCINE  04/10/2015  . MAMMOGRAM  01/11/2017  . TETANUS/TDAP  12/06/2019  . COLONOSCOPY  01/12/2020  . DEXA SCAN  Completed  . ZOSTAVAX  Completed  . PNA vac Low Risk Adult  Completed    Immunization History  Administered Date(s) Administered  . Influenza, High Dose Seasonal PF 07/14/2014  . Influenza-Unspecified 06/28/2013  . Pneumococcal Conjugate-13  02/01/2014  . Pneumococcal-Unspecified 09/10/2007  . Tdap 12/05/2009  . Zoster 09/09/2005    Preventative care: Last colonoscopy: 2011 Last mammogram: 01/2015 CAT B Last pap smear/pelvic exam: 2011 remote DEXA:09/2012- due THIS YEAR- showed + osteoporsis- on D 3 and calcium  Prior vaccinations: TD or Tdap: 2011  Influenza: 2014  Pneumococcal: 2009 Prevnar 13: 2015 Shingles/Zostavax: 2007  Allergies Allergies  Allergen Reactions  . Adhesive [Tape] Hives  . Codeine Hives  . Fosamax [Alendronate Sodium] Other (See Comments)    Reflux increase  . Penicillins Hives  . Shellfish Allergy   . Iodine Rash   Surgical history Past Surgical History  Procedure Laterality Date  . Appendectomy    . Cholecystectomy    . Eye surgery    . Tonsillectomy and adenoidectomy     Family history Family History  Problem Relation Age of Onset  . Hypertension Mother   . Diabetes Father   . Heart disease Father   . Hyperlipidemia Sister   . Hyperlipidemia Brother    Tobacco History  Substance Use Topics  . Smoking status: Never Smoker   . Smokeless tobacco: Not on file  . Alcohol Use:  No   MEDICARE WELLNESS OBJECTIVES: Tobacco use: She does not smoke.  Patient is not a former smoker. If yes, counseling given Alcohol Current alcohol use: none Osteoporosis: postmenopausal estrogen deficiency and dietary calcium and/or vitamin D deficiency, History of fracture in the past year: no Fall risk: Low Risk Hearing: normal Visual acuity: normal,  does not perform annual eye exam, every 2 years Diet: in general, a "healthy" diet   Physical activity: Current Exercise Habits:: Home exercise routine, Type of exercise: walking, Time (Minutes): 20, Frequency (Times/Week): 3, Weekly Exercise (Minutes/Week): 60, Intensity: Mild Cardiac risk factors: Cardiac Risk Factors include: advanced age (>91men, >18 women);dyslipidemia;sedentary lifestyle Depression/mood screen:   Depression screen [facility] 2/9  04/11/2015  Decreased Interest 0  Down, Depressed, Hopeless 0  PHQ - 2 Score 0    ADLs:  In your present state of health, do you have any difficulty performing the following activities: 04/11/2015  Hearing? N  Vision? N  Difficulty concentrating or making decisions? N  Walking or climbing stairs? N  Dressing or bathing? N  Doing errands, shopping? N  Preparing Food and eating ? N  Using the Toilet? N  In the past six months, have you accidently leaked urine? N  Do you have problems with loss of bowel control? N  Managing your Medications? N  Managing your Finances? N  Housekeeping or managing your Housekeeping? N     Cognitive Testing  Alert? Yes  Normal Appearance?Yes  Oriented to person? Yes  Place? Yes   Time? Yes  Recall of three objects?  Yes  Can perform simple calculations? Yes  Displays appropriate judgment?Yes  Can read the correct time from a watch face?Yes  EOL planning: Does patient have an advance directive?: Yes Type of Advance Directive: [place], Living will Does patient want to make changes to advanced directive?: No - Patient declined Copy of advanced directive(s) in chart?: No - copy requested   Objective:     Blood pressure 120/72, pulse 72, temperature 97.7 F (36.5 C), resp. rate 16, height 5' 4.5" (1.638 m), weight 145 lb (65.772 kg). Body mass index is 24.51 kg/(m^2).  General appearance: alert, no distress, WD/WN,  female HEENT: normocephalic, sclerae anicteric, TMs pearly, nares patent, no discharge or erythema, pharynx normal Oral cavity: MMM, no lesions Neck: supple, no lymphadenopathy, no thyromegaly, no masses Heart: RRR, normal S1, S2, no murmurs Lungs: CTA bilaterally, no wheezes, rhonchi, or rales Abdomen: +bs, soft, non tender, non distended, no masses, no hepatomegaly, no splenomegaly Musculoskeletal: nontender, no swelling, no obvious deformity Extremities: no edema, no cyanosis, no clubbing Pulses: 2+ symmetric,  upper and lower extremities, normal cap refill Neurological: alert, oriented x 3, CN2-12 intact, strength normal upper extremities and lower extremities, sensation normal throughout, DTRs 2+ throughout, no cerebellar signs, gait normal Psychiatric: normal affect, behavior normal, pleasant  Breast: nontender, no masses or lumps, no skin changes, no nipple discharge or inversion, no axillary lymphadenopathy Gyn: defer Rectal: defer  Medicare Attestation I have personally reviewed: The patient's medical and social history Their use of alcohol, tobacco or illicit drugs Their current medications and supplements The patient's functional ability including ADLs,fall risks, home safety risks, cognitive, and hearing and visual impairment Diet and physical activities Evidence for depression or mood disorders  The patient's weight, height, BMI, and visual acuity have been recorded in the chart.  I have made referrals, counseling, and provided education to the patient based on review of the above and I have provided the  patient with a written personalized care plan for preventive services.     Vicie Mutters, PA-C   04/11/2015

## 2015-04-12 LAB — URINALYSIS, ROUTINE W REFLEX MICROSCOPIC
BILIRUBIN URINE: NEGATIVE
Glucose, UA: NEGATIVE
HGB URINE DIPSTICK: NEGATIVE
KETONES UR: NEGATIVE
LEUKOCYTES UA: NEGATIVE
Nitrite: NEGATIVE
PH: 6 (ref 5.0–8.0)
Protein, ur: NEGATIVE
SPECIFIC GRAVITY, URINE: 1.003 (ref 1.001–1.035)

## 2015-04-12 LAB — BASIC METABOLIC PANEL WITH GFR
BUN: 15 mg/dL (ref 7–25)
CO2: 32 mmol/L — ABNORMAL HIGH (ref 20–31)
Calcium: 9.6 mg/dL (ref 8.6–10.4)
Chloride: 104 mmol/L (ref 98–110)
Creat: 0.65 mg/dL (ref 0.60–0.93)
GFR, Est Non African American: 89 mL/min (ref >=60)
GLUCOSE: 79 mg/dL (ref 65–99)
POTASSIUM: 4.6 mmol/L (ref 3.5–5.3)
SODIUM: 142 mmol/L (ref 135–146)

## 2015-04-12 LAB — INSULIN, FASTING: Insulin fasting, serum: 5.2 u[IU]/mL (ref 2.0–19.6)

## 2015-04-12 LAB — HEPATIC FUNCTION PANEL
ALT: 13 U/L (ref 6–29)
AST: 17 U/L (ref 10–35)
Albumin: 4 g/dL (ref 3.6–5.1)
Alkaline Phosphatase: 66 U/L (ref 33–130)
BILIRUBIN INDIRECT: 0.3 mg/dL (ref 0.2–1.2)
BILIRUBIN TOTAL: 0.4 mg/dL (ref 0.2–1.2)
Bilirubin, Direct: 0.1 mg/dL (ref <=0.2)
Total Protein: 6.7 g/dL (ref 6.1–8.1)

## 2015-04-12 LAB — MICROALBUMIN / CREATININE URINE RATIO
CREATININE, URINE: 63.9 mg/dL
MICROALB UR: 0.3 mg/dL (ref <2.0)
Microalb Creat Ratio: 4.7 mg/g (ref 0.0–30.0)

## 2015-04-12 LAB — VITAMIN D 25 HYDROXY (VIT D DEFICIENCY, FRACTURES): Vit D, 25-Hydroxy: 59 ng/mL (ref 30–100)

## 2015-04-12 LAB — MAGNESIUM: MAGNESIUM: 2.1 mg/dL (ref 1.5–2.5)

## 2015-04-12 LAB — LIPID PANEL
CHOL/HDL RATIO: 3.4 ratio (ref <=5.0)
CHOLESTEROL: 191 mg/dL (ref 125–200)
HDL: 57 mg/dL (ref >=46)
LDL CALC: 104 mg/dL (ref <130)
Triglycerides: 152 mg/dL — ABNORMAL HIGH (ref <150)
VLDL: 30 mg/dL (ref <30)

## 2015-04-12 LAB — TSH: TSH: 1.024 u[IU]/mL (ref 0.350–4.500)

## 2015-04-14 NOTE — Addendum Note (Signed)
Addended by: Sariya Trickey A on: 04/14/2015 12:44 PM   Modules accepted: Orders

## 2015-04-28 ENCOUNTER — Other Ambulatory Visit: Payer: Medicare Other

## 2015-05-11 ENCOUNTER — Inpatient Hospital Stay: Admission: RE | Admit: 2015-05-11 | Payer: Medicare Other | Source: Ambulatory Visit

## 2015-06-01 ENCOUNTER — Other Ambulatory Visit: Payer: Self-pay | Admitting: Internal Medicine

## 2015-06-22 ENCOUNTER — Other Ambulatory Visit: Payer: Medicare Other

## 2015-07-19 ENCOUNTER — Ambulatory Visit
Admission: RE | Admit: 2015-07-19 | Discharge: 2015-07-19 | Disposition: A | Discharge status: Home / Self Care | Payer: Medicare Other | Source: Ambulatory Visit | Attending: Physician Assistant | Admitting: Physician Assistant

## 2015-07-19 DIAGNOSIS — M810 Age-related osteoporosis without current pathological fracture: Secondary | ICD-10-CM

## 2015-07-27 ENCOUNTER — Ambulatory Visit (INDEPENDENT_AMBULATORY_CARE_PROVIDER_SITE_OTHER): Payer: Medicare Other | Admitting: Physician Assistant

## 2015-07-27 ENCOUNTER — Encounter: Payer: Self-pay | Admitting: Physician Assistant

## 2015-07-27 VITALS — BP 124/74 | HR 70 | Temp 97.5°F | Resp 14 | Ht 64.5 in

## 2015-07-27 DIAGNOSIS — E785 Hyperlipidemia, unspecified: Secondary | ICD-10-CM

## 2015-07-27 DIAGNOSIS — E559 Vitamin D deficiency, unspecified: Secondary | ICD-10-CM

## 2015-07-27 DIAGNOSIS — I341 Nonrheumatic mitral (valve) prolapse: Secondary | ICD-10-CM | POA: Diagnosis not present

## 2015-07-27 DIAGNOSIS — Z79899 Other long term (current) drug therapy: Secondary | ICD-10-CM

## 2015-07-27 DIAGNOSIS — I10 Essential (primary) hypertension: Secondary | ICD-10-CM

## 2015-07-27 DIAGNOSIS — R739 Hyperglycemia, unspecified: Secondary | ICD-10-CM | POA: Diagnosis not present

## 2015-07-27 LAB — CBC WITH DIFFERENTIAL/PLATELET
Basophils Absolute: 0.1 10*3/uL (ref 0.0–0.1)
Basophils Relative: 1 % (ref 0–1)
EOS ABS: 0.2 10*3/uL (ref 0.0–0.7)
EOS PCT: 4 % (ref 0–5)
HCT: 42.4 % (ref 36.0–46.0)
Hemoglobin: 14.1 g/dL (ref 12.0–15.0)
Lymphocytes Relative: 34 % (ref 12–46)
Lymphs Abs: 1.7 10*3/uL (ref 0.7–4.0)
MCH: 31.4 pg (ref 26.0–34.0)
MCHC: 33.3 g/dL (ref 30.0–36.0)
MCV: 94.4 fL (ref 78.0–100.0)
MONOS PCT: 10 % (ref 3–12)
MPV: 8.5 fL — ABNORMAL LOW (ref 8.6–12.4)
Monocytes Absolute: 0.5 10*3/uL (ref 0.1–1.0)
Neutro Abs: 2.6 10*3/uL (ref 1.7–7.7)
Neutrophils Relative %: 51 % (ref 43–77)
Platelets: 281 10*3/uL (ref 150–400)
RBC: 4.49 MIL/uL (ref 3.87–5.11)
RDW: 12.6 % (ref 11.5–15.5)
WBC: 5 10*3/uL (ref 4.0–10.5)

## 2015-07-27 LAB — BASIC METABOLIC PANEL WITH GFR
BUN: 14 mg/dL (ref 7–25)
CO2: 27 mmol/L (ref 20–31)
Calcium: 9.7 mg/dL (ref 8.6–10.4)
Chloride: 104 mmol/L (ref 98–110)
Creat: 0.62 mg/dL (ref 0.60–0.93)
Glucose, Bld: 87 mg/dL (ref 65–99)
POTASSIUM: 4.6 mmol/L (ref 3.5–5.3)
SODIUM: 141 mmol/L (ref 135–146)

## 2015-07-27 LAB — LIPID PANEL
Cholesterol: 198 mg/dL (ref 125–200)
HDL: 55 mg/dL (ref >=46)
LDL CALC: 114 mg/dL (ref <130)
TRIGLYCERIDES: 144 mg/dL (ref <150)
Total CHOL/HDL Ratio: 3.6 Ratio (ref <=5.0)
VLDL: 29 mg/dL (ref <30)

## 2015-07-27 LAB — HEPATIC FUNCTION PANEL
ALK PHOS: 70 U/L (ref 33–130)
ALT: 14 U/L (ref 6–29)
AST: 17 U/L (ref 10–35)
Albumin: 4.3 g/dL (ref 3.6–5.1)
BILIRUBIN DIRECT: 0.1 mg/dL (ref <=0.2)
BILIRUBIN INDIRECT: 0.3 mg/dL (ref 0.2–1.2)
BILIRUBIN TOTAL: 0.4 mg/dL (ref 0.2–1.2)
Total Protein: 6.7 g/dL (ref 6.1–8.1)

## 2015-07-27 LAB — MAGNESIUM: Magnesium: 2 mg/dL (ref 1.5–2.5)

## 2015-07-27 LAB — TSH: TSH: 0.772 u[IU]/mL (ref 0.350–4.500)

## 2015-07-27 NOTE — Progress Notes (Signed)
Assessment and Plan:  1. Hypertension -Continue medication, monitor blood pressure at home. Continue DASH diet.  Reminder to go to the ER if any CP, SOB, nausea, dizziness, severe HA, changes vision/speech, left arm numbness and tingling and jaw pain.  2. Cholesterol -Continue diet and exercise. Check cholesterol.   3. Prediabetes  -Continue diet and exercise. Check A1C  4. Vitamin D Def - check level and continue medications.   Continue diet and meds as discussed. Further disposition pending results of labs. Over 30 minutes of exam, counseling, chart review, and critical decision making was performed  HPI 74 y.o. female  presents for 3 month follow up on hypertension, cholesterol, prediabetes, and vitamin D deficiency.   Her blood pressure has been controlled at home, today their BP is BP: 124/74 mmHg  She does workout. She denies chest pain, shortness of breath, dizziness.  She is on cholesterol medication and denies myalgias. Her cholesterol is at goal. The cholesterol last visit was:   Lab Results  Component Value Date   CHOL 191 04/11/2015   HDL 57 04/11/2015   LDLCALC 104 04/11/2015   TRIG 152* 04/11/2015   CHOLHDL 3.4 04/11/2015    She has been working on diet and exercise for prediabetes, and denies paresthesia of the feet, polydipsia, polyuria and visual disturbances. Last A1C in the office was:  Lab Results  Component Value Date   HGBA1C 5.1 04/11/2015   Patient is on Vitamin D supplement.   Lab Results  Component Value Date   VD25OH 34 04/11/2015      Current Medications:  Current Outpatient Prescriptions on File Prior to Visit  Medication Sig Dispense Refill  . albuterol (PROVENTIL HFA;VENTOLIN HFA) 108 (90 BASE) MCG/ACT inhaler Inhale 1-2 puffs into the lungs every 6 (six) hours as needed for wheezing or shortness of breath. 18 g 3  . aspirin 81 MG chewable tablet Chew 81 mg by mouth daily.    . cetirizine (ZYRTEC) 10 MG tablet Take 10 mg by mouth daily.     . Cholecalciferol (VITAMIN D) 2000 UNITS CAPS Take by mouth.    . diclofenac sodium (VOLTAREN) 1 % GEL Apply 2 g topically 4 (four) times daily. 100 g 3  . montelukast (SINGULAIR) 10 MG tablet TAKE 1 BY MOUTH AT BEDTIME 90 tablet 0  . Multiple Vitamins-Minerals (MULTIVITAMIN PO) Take by mouth daily.    . Omega-3 Fatty Acids (FISH OIL PO) Take by mouth daily.    Marland Kitchen omeprazole (PRILOSEC) 20 MG capsule TAKE 1 BY MOUTH DAILY --COURTNEY FORCUCCI 90 capsule 0  . pravastatin (PRAVACHOL) 40 MG tablet Take 1 tablet (40 mg total) by mouth daily. 90 tablet prn  . Probiotic Product (PROBIOTIC DAILY PO) Take by mouth daily.    . verapamil (CALAN) 120 MG tablet take 1 tablet by mouth twice a day 180 tablet 1   No current facility-administered medications on file prior to visit.   Medical History:  Past Medical History  Diagnosis Date  . Hypertension   . Hyperlipidemia   . GERD (gastroesophageal reflux disease)   . Other abnormal glucose   . Anemia   . Asthma   . Osteopenia   . MVP (mitral valve prolapse)   . Goiter    Allergies:  Allergies  Allergen Reactions  . Adhesive [Tape] Hives  . Codeine Hives  . Fosamax [Alendronate Sodium] Other (See Comments)    Reflux increase  . Penicillins Hives  . Shellfish Allergy   . Iodine Rash  Review of Systems:  Review of Systems  Constitutional: Negative.   HENT: Negative.   Eyes: Negative.   Respiratory: Negative.   Cardiovascular: Negative.   Gastrointestinal: Negative.   Genitourinary: Negative.   Musculoskeletal: Negative.   Skin: Negative.   Neurological: Negative.   Endo/Heme/Allergies: Negative.   Psychiatric/Behavioral: Negative.     Family history- Review and unchanged Social history- Review and unchanged Physical Exam: BP 124/74 mmHg  Pulse 70  Temp(Src) 97.5 F (36.4 C) (Temporal)  Resp 14  Ht 5' 4.5" (1.638 m)  SpO2 99% Wt Readings from Last 3 Encounters:  04/11/15 145 lb (65.772 kg)  12/05/14 145 lb 9.6 oz  (66.044 kg)  07/14/14 144 lb (65.318 kg)   General Appearance: Well nourished, in no apparent distress. Eyes: PERRLA, EOMs, conjunctiva no swelling or erythema Sinuses: No Frontal/maxillary tenderness ENT/Mouth: Ext aud canals clear, TMs without erythema, bulging. No erythema, swelling, or exudate on post pharynx.  Tonsils not swollen or erythematous. Hearing normal.  Neck: Supple, thyroid normal.  Respiratory: Respiratory effort normal, BS equal bilaterally without rales, rhonchi, wheezing or stridor.  Cardio: RRR with no MRGs. Brisk peripheral pulses without edema.  Abdomen: Soft, + BS,  Non tender, no guarding, rebound, hernias, masses. Lymphatics: Non tender without lymphadenopathy.  Musculoskeletal: Full ROM, 5/5 strength, Normal gait Skin: Warm, dry without rashes, lesions, ecchymosis.  Neuro: Cranial nerves intact. Normal muscle tone, no cerebellar symptoms. Psych: Awake and oriented X 3, normal affect, Insight and Judgment appropriate.    Vicie Mutters, PA-C 9:11 AM [facility]

## 2015-07-28 LAB — VITAMIN D 25 HYDROXY (VIT D DEFICIENCY, FRACTURES): Vit D, 25-Hydroxy: 56 ng/mL (ref 30–100)

## 2015-07-31 ENCOUNTER — Other Ambulatory Visit: Payer: Self-pay | Admitting: Internal Medicine

## 2015-09-28 ENCOUNTER — Other Ambulatory Visit: Payer: Self-pay | Admitting: *Deleted

## 2015-09-28 MED ORDER — ALBUTEROL SULFATE HFA 108 (90 BASE) MCG/ACT IN AERS: INHALATION_SPRAY | RESPIRATORY_TRACT | Status: DC

## 2015-10-08 ENCOUNTER — Other Ambulatory Visit: Payer: Self-pay | Admitting: Internal Medicine

## 2015-10-16 ENCOUNTER — Ambulatory Visit (INDEPENDENT_AMBULATORY_CARE_PROVIDER_SITE_OTHER): Payer: Medicare Other | Admitting: Internal Medicine

## 2015-10-16 ENCOUNTER — Encounter: Payer: Self-pay | Admitting: Internal Medicine

## 2015-10-16 VITALS — BP 120/60 | HR 83 | Temp 97.3°F | Resp 16 | Ht 64.5 in | Wt 143.0 lb

## 2015-10-16 DIAGNOSIS — J029 Acute pharyngitis, unspecified: Secondary | ICD-10-CM | POA: Diagnosis not present

## 2015-10-16 DIAGNOSIS — J0410 Acute tracheitis without obstruction: Secondary | ICD-10-CM

## 2015-10-16 MED ORDER — HYDROCODONE-ACETAMINOPHEN 5-325 MG PO TABS: ORAL_TABLET | ORAL | Status: DC

## 2015-10-16 MED ORDER — PREDNISONE 20 MG PO TABS: ORAL_TABLET | ORAL | Status: DC

## 2015-10-16 MED ORDER — AZITHROMYCIN 250 MG PO TABS: ORAL_TABLET | ORAL | Status: DC

## 2015-10-16 MED ORDER — PROMETHAZINE-DM 6.25-15 MG/5ML PO SYRP: ORAL_SOLUTION | ORAL | Status: DC

## 2015-10-16 NOTE — Progress Notes (Signed)
  Subjective:    Patient ID: Isabel Oliver, female    DOB: [birth year], 75 y.o.   MRN: [id number]  HPI  Patient presents with a 3 day hx/o of sore throat with crescendo pain increasing now to 10/10 and if a sharp quality when shre is swallowing. Denies fever , chills, rash, but does have dry cough, but no chest congestion.   .medp Allergies  Allergen Reactions  . Adhesive [Tape] Hives  . Codeine Hives  . Fosamax [Alendronate Sodium] Other (See Comments)    Reflux increase  . Penicillins Hives  . Shellfish Allergy   . Iodine Rash   Past Medical History  Diagnosis Date  . Hypertension   . Hyperlipidemia   . GERD (gastroesophageal reflux disease)   . Other abnormal glucose   . Anemia   . Asthma   . Osteopenia   . MVP (mitral valve prolapse)   . Goiter    Review of Systems  10 point systems review negative except as above.    Objective:   Physical Exam  BP 120/60 mmHg  Pulse 83  Temp(Src) 97.3 F (36.3 C)  Resp 16  Ht 5' 4.5" (1.638 m)  Wt 143 lb (64.864 kg)  BMI 24.18 kg/m2  SpO2 98%  Hacking dry cough. No respiratory distress. HEENT - Eac's patent. TM's retracted & dull w/o erythema. EOM's full. PERRLA. NasoOroPharynx 2+ injected w/o exudates.  Neck - supple. Nl Thyroid. Carotids 2+ & No bruits,JVD. Tender ant cx LN's bilaterally.  Chest - Clear equal BS w/ few scattered inspiratory rales, but no  Rhonchi or wheezes. Cor - Nl HS. RRR w/o sig MGR. PP 1(+). No edema. Abd - No palpable organomegaly, masses or tenderness. BS nl. MS- FROM w/o deformities. Muscle power, tone and bulk Nl. Gait Nl. Neuro - No obvious Cr N abnormalities. Sensory, motor and Cerebellar functions appear Nl w/o focal abnormalities. Psyche - Mental status normal & appropriate.  No delusions, ideations or obvious mood abnormalities.    Assessment & Plan:   1. Acute pharyngitis, unspecified etiology  - suspect viral, but will treat empirically w/ Abx's  & steroids as below.  - discussed  w/patient potential for relapse in sx's and possible need for re treatment,  if sx's rebound off prednisone tyaper.  2. Tracheitis  - predniSONE (DELTASONE) 20 MG tablet; 1 tab 3 x day for 3 days, then 1 tab 2 x day for 3 days, then 1 tab 1 x day for 5 days  Dispense: 20 tablet; Refill: 0 - azithromycin (ZITHROMAX) 250 MG tablet; Take 2 tablets (500 mg) on  Day 1,  followed by 1 tablet (250 mg) once daily on Days 2 through 5.  Dispense: 6 each; Refill: 1 - promethazine-dextromethorphan (PROMETHAZINE-DM) 6.25-15 MG/5ML syrup; Take 1 to 2 tsp enery 4 hours if needed for cough  Dispense: 360 mL; Refill: 1 - HYDROcodone-acetaminophen (NORCO) 5-325 MG tablet; Take 1/2 to 1 tablet every 3 to 4 hours if needed for pain or cough.  Dispense: 30 tablet; Refill: 0

## 2015-10-26 ENCOUNTER — Other Ambulatory Visit: Payer: Self-pay | Admitting: *Deleted

## 2015-10-26 MED ORDER — MONTELUKAST SODIUM 10 MG PO TABS: ORAL_TABLET | ORAL | Status: DC

## 2015-11-02 ENCOUNTER — Ambulatory Visit (INDEPENDENT_AMBULATORY_CARE_PROVIDER_SITE_OTHER): Payer: Medicare Other | Admitting: Internal Medicine

## 2015-11-02 ENCOUNTER — Encounter: Payer: Self-pay | Admitting: Internal Medicine

## 2015-11-02 VITALS — BP 126/72 | HR 80 | Temp 97.7°F | Resp 16 | Ht 64.5 in | Wt 143.3 lb

## 2015-11-02 DIAGNOSIS — E559 Vitamin D deficiency, unspecified: Secondary | ICD-10-CM | POA: Diagnosis not present

## 2015-11-02 DIAGNOSIS — R7303 Prediabetes: Secondary | ICD-10-CM | POA: Diagnosis not present

## 2015-11-02 DIAGNOSIS — R7309 Other abnormal glucose: Secondary | ICD-10-CM | POA: Diagnosis not present

## 2015-11-02 DIAGNOSIS — I10 Essential (primary) hypertension: Secondary | ICD-10-CM

## 2015-11-02 DIAGNOSIS — J4520 Mild intermittent asthma, uncomplicated: Secondary | ICD-10-CM

## 2015-11-02 DIAGNOSIS — Z79899 Other long term (current) drug therapy: Secondary | ICD-10-CM | POA: Diagnosis not present

## 2015-11-02 DIAGNOSIS — E785 Hyperlipidemia, unspecified: Secondary | ICD-10-CM

## 2015-11-02 LAB — BASIC METABOLIC PANEL WITH GFR
BUN: 14 mg/dL (ref 7–25)
CO2: 24 mmol/L (ref 20–31)
Calcium: 9.5 mg/dL (ref 8.6–10.4)
Chloride: 104 mmol/L (ref 98–110)
Creat: 0.72 mg/dL (ref 0.60–0.93)
GFR, EST NON AFRICAN AMERICAN: 83 mL/min (ref >=60)
GLUCOSE: 84 mg/dL (ref 65–99)
POTASSIUM: 4.7 mmol/L (ref 3.5–5.3)
Sodium: 140 mmol/L (ref 135–146)

## 2015-11-02 LAB — HEPATIC FUNCTION PANEL
ALBUMIN: 4 g/dL (ref 3.6–5.1)
ALK PHOS: 61 U/L (ref 33–130)
ALT: 15 U/L (ref 6–29)
AST: 17 U/L (ref 10–35)
BILIRUBIN INDIRECT: 0.3 mg/dL (ref 0.2–1.2)
Bilirubin, Direct: 0.1 mg/dL (ref <=0.2)
TOTAL PROTEIN: 6.5 g/dL (ref 6.1–8.1)
Total Bilirubin: 0.4 mg/dL (ref 0.2–1.2)

## 2015-11-02 LAB — CBC WITH DIFFERENTIAL/PLATELET
Basophils Absolute: 0 10*3/uL (ref 0.0–0.1)
Basophils Relative: 0 % (ref 0–1)
EOS PCT: 4 % (ref 0–5)
Eosinophils Absolute: 0.2 10*3/uL (ref 0.0–0.7)
HEMATOCRIT: 42.9 % (ref 36.0–46.0)
Hemoglobin: 14.2 g/dL (ref 12.0–15.0)
LYMPHS PCT: 25 % (ref 12–46)
Lymphs Abs: 1.2 10*3/uL (ref 0.7–4.0)
MCH: 31.2 pg (ref 26.0–34.0)
MCHC: 33.1 g/dL (ref 30.0–36.0)
MCV: 94.3 fL (ref 78.0–100.0)
MONO ABS: 0.6 10*3/uL (ref 0.1–1.0)
MPV: 8.6 fL (ref 8.6–12.4)
Monocytes Relative: 13 % — ABNORMAL HIGH (ref 3–12)
Neutro Abs: 2.8 10*3/uL (ref 1.7–7.7)
Neutrophils Relative %: 58 % (ref 43–77)
Platelets: 275 10*3/uL (ref 150–400)
RBC: 4.55 MIL/uL (ref 3.87–5.11)
RDW: 13.2 % (ref 11.5–15.5)
WBC: 4.9 10*3/uL (ref 4.0–10.5)

## 2015-11-02 LAB — LIPID PANEL
Cholesterol: 234 mg/dL — ABNORMAL HIGH (ref 125–200)
HDL: 59 mg/dL (ref >=46)
LDL CALC: 135 mg/dL — AB (ref <130)
TRIGLYCERIDES: 200 mg/dL — AB (ref <150)
Total CHOL/HDL Ratio: 4 Ratio (ref <=5.0)
VLDL: 40 mg/dL — AB (ref <30)

## 2015-11-02 LAB — TSH: TSH: 0.85 m[IU]/L

## 2015-11-02 LAB — MAGNESIUM: Magnesium: 2 mg/dL (ref 1.5–2.5)

## 2015-11-02 NOTE — Progress Notes (Signed)
Patient ID: Isabel Oliver, female   DOB: 10-12-1940, 75 y.o.   MRN: [id number]   This very nice 75 y.o. MWFpresents for 3 month follow up with Hypertension, Hyperlipidemia, Pre-Diabetes and Vitamin D Deficiency. Other problems include GERD controlled with diet & meds .    Patient is treated for HTN circa 1995 & BP has been controlled at home. Today's BP: 126/72 mmHg. Patient has had no complaints of any cardiac type chest pain, palpitations, dyspnea/orthopnea/PND, dizziness, claudication, or dependent edema.   Hyperlipidemia is controlled with diet & meds. Patient denies myalgias or other med SE's. Last Lipids were Cholesterol 198; HDL 55; LDL Cholesterol 114; Triglycerides 144 on 07/27/2015.    Also, the patient has history of PreDiabetes with A1c 6.0% in 2014  and has had no symptoms of reactive hypoglycemia, diabetic polys, paresthesias or visual blurring.  Last A1c was  5.1% on 04/11/2015.   Further, the patient also has history of Vitamin D Deficiency and supplements vitamin D without any suspected side-effects. Last vitamin D was 56 on 07/27/2015.   Medication Sig  . albuterol (HFA inhaler USE 1 TO 2 INHALATIONS INTO THE LUNGS EVERY 6 HOURS AS NEEDED   . aspirin 81 MG Chew 81 mg by mouth daily.  . cetirizine  10 MG tablet Take 10 mg by mouth daily.  Marland Kitchen VITAMIN D 2000 UNITS  Take by mouth.  . VOLTAREN 1 % GEL Apply 2 g topically 4 (four) times daily.  . montelukast 10 MG tablet TAKE 1 BY MOUTH AT BEDTIME  . Multiple Vitamins-Minerals  Take by mouth daily.  . Omega-3 FISH OIL Take by mouth daily.  Marland Kitchen omeprazole 20 MG  TAKE 1 BY MOUTH DAILY  . pravastatin  40 MG tablet take 1 tablet by mouth once daily for cholesterol  . Probiotic   Take by mouth daily.  . verapamil  120 MG tablet take 1 tablet by mouth twice a day   Allergies  Allergen Reactions  . Adhesive [Tape] Hives  . Codeine Hives  . Fosamax [Alendronate Sodium] Other (See Comments)    Reflux increase  . Penicillins Hives  .  Shellfish Allergy   . Iodine Rash   PMHx:   Past Medical History  Diagnosis Date  . Hypertension   . Hyperlipidemia   . GERD (gastroesophageal reflux disease)   . Other abnormal glucose   . Anemia   . Asthma   . Osteopenia   . MVP (mitral valve prolapse)   . Goiter    Immunization History  Administered Date(s) Administered  . Influenza, High Dose Seasonal PF 07/14/2014  . Influenza-Unspecified 06/28/2013, 07/06/2015  . Pneumococcal Conjugate-13 02/01/2014  . Pneumococcal-Unspecified 09/10/2007  . Tdap 12/05/2009  . Zoster 09/09/2005   Past Surgical History  Procedure Laterality Date  . Appendectomy    . Cholecystectomy    . Eye surgery    . Tonsillectomy and adenoidectomy     FHx:    Reviewed / unchanged  SHx:    Reviewed / unchanged  Systems Review:  Constitutional: Denies fever, chills, wt changes, headaches, insomnia, fatigue, night sweats, change in appetite. Eyes: Denies redness, blurred vision, diplopia, discharge, itchy, watery eyes.  ENT: Denies discharge, congestion, post nasal drip, epistaxis, sore throat, earache, hearing loss, dental pain, tinnitus, vertigo, sinus pain, snoring.  CV: Denies chest pain, palpitations, irregular heartbeat, syncope, dyspnea, diaphoresis, orthopnea, PND, claudication or edema. Respiratory: denies cough, dyspnea, DOE, pleurisy, hoarseness, laryngitis, wheezing.  Gastrointestinal: Denies dysphagia, odynophagia, heartburn, reflux, water brash, abdominal  pain or cramps, nausea, vomiting, bloating, diarrhea, constipation, hematemesis, melena, hematochezia  or hemorrhoids. Genitourinary: Denies dysuria, frequency, urgency, nocturia, hesitancy, discharge, hematuria or flank pain. Musculoskeletal: Denies arthralgias, myalgias, stiffness, jt. swelling, pain, limping or strain/sprain.  Skin: Denies pruritus, rash, hives, warts, acne, eczema or change in skin lesion(s). Neuro: No weakness, tremor, incoordination, spasms, paresthesia or  pain. Psychiatric: Denies confusion, memory loss or sensory loss. Endo: Denies change in weight, skin or hair change.  Heme/Lymph: No excessive bleeding, bruising or enlarged lymph nodes.  Physical Exam  BP 126/72 mmHg  Pulse 80  Temp(Src) 97.7 F (36.5 C)  Resp 16  Ht 5' 4.5" (1.638 m)  Wt 143 lb 4.8 oz (65 kg)  BMI 24.23 kg/m2  Appears well nourished and in no distress. Eyes: PERRLA, EOMs, conjunctiva no swelling or erythema. Sinuses: No frontal/maxillary tenderness ENT/Mouth: EAC's clear, TM's nl w/o erythema, bulging. Nares clear w/o erythema, swelling, exudates. Oropharynx clear without erythema or exudates. Oral hygiene is good. Tongue normal, non obstructing. Hearing intact.  Neck: Supple. Thyroid nl. Car 2+/2+ without bruits, nodes or JVD. Chest: Respirations nl with BS clear & equal w/o rales, rhonchi, wheezing or stridor.  Cor: Heart sounds normal w/ regular rate and rhythm without sig. murmurs, gallops, clicks, or rubs. Peripheral pulses normal and equal  without edema.  Abdomen: Soft & bowel sounds normal. Non-tender w/o guarding, rebound, hernias, masses, or organomegaly.  Lymphatics: Unremarkable.  Musculoskeletal: Full ROM all peripheral extremities, joint stability, 5/5 strength, and normal gait.  Skin: Warm, dry without exposed rashes, lesions or ecchymosis apparent.  Neuro: Cranial nerves intact, reflexes equal bilaterally. Sensory-motor testing grossly intact. Tendon reflexes grossly intact.  Pysch: Alert & oriented x 3.  Insight and judgement nl & appropriate. No ideations.  Assessment and Plan:  1. Essential hypertension  - TSH  2. Hyperlipidemia  - Lipid panel - TSH  3. Prediabetes  - Hemoglobin A1c - Insulin, random  4. Vitamin D deficiency  - VITAMIN D 25 Hydroxy   5. Asthma, mild intermittent, uncomplicated   6. Medication management  - CBC with Differential/Platelet - BASIC METABOLIC PANEL WITH GFR - Hepatic function panel -  Magnesium   Recommended regular exercise, BP monitoring, weight control, and discussed med and SE's. Recommended labs to assess and monitor clinical status. Further disposition pending results of labs. Over 30 minutes of exam, counseling, chart review was performed

## 2015-11-02 NOTE — Patient Instructions (Signed)

## 2015-11-03 LAB — VITAMIN D 25 HYDROXY (VIT D DEFICIENCY, FRACTURES): Vit D, 25-Hydroxy: 41 ng/mL (ref 30–100)

## 2015-11-03 LAB — HEMOGLOBIN A1C
Hgb A1c MFr Bld: 5.4 % (ref <5.7)
MEAN PLASMA GLUCOSE: 108 mg/dL (ref <117)

## 2015-11-03 LAB — INSULIN, RANDOM: INSULIN: 7 u[IU]/mL (ref 2.0–19.6)

## 2015-11-22 ENCOUNTER — Other Ambulatory Visit: Payer: Self-pay | Admitting: *Deleted

## 2015-11-22 MED ORDER — OMEPRAZOLE 20 MG PO CPDR: DELAYED_RELEASE_CAPSULE | ORAL | Status: DC

## 2016-01-18 ENCOUNTER — Other Ambulatory Visit: Payer: Self-pay | Admitting: Internal Medicine

## 2016-02-01 ENCOUNTER — Ambulatory Visit: Payer: Self-pay | Admitting: Internal Medicine

## 2016-03-27 ENCOUNTER — Other Ambulatory Visit: Payer: Self-pay | Admitting: Internal Medicine

## 2016-03-27 DIAGNOSIS — Z1231 Encounter for screening mammogram for malignant neoplasm of breast: Secondary | ICD-10-CM

## 2016-04-06 ENCOUNTER — Other Ambulatory Visit: Payer: Self-pay | Admitting: Internal Medicine

## 2016-04-11 ENCOUNTER — Encounter: Payer: Self-pay | Admitting: Physician Assistant

## 2016-04-12 ENCOUNTER — Ambulatory Visit: Payer: Medicare Other

## 2016-04-17 ENCOUNTER — Ambulatory Visit
Admission: RE | Admit: 2016-04-17 | Discharge: 2016-04-17 | Disposition: A | Discharge status: Home / Self Care | Payer: Medicare Other | Source: Ambulatory Visit | Attending: Internal Medicine | Admitting: Internal Medicine

## 2016-04-17 DIAGNOSIS — Z1231 Encounter for screening mammogram for malignant neoplasm of breast: Secondary | ICD-10-CM

## 2016-05-06 DIAGNOSIS — H10413 Chronic giant papillary conjunctivitis, bilateral: Secondary | ICD-10-CM | POA: Diagnosis not present

## 2016-05-06 DIAGNOSIS — Z961 Presence of intraocular lens: Secondary | ICD-10-CM | POA: Diagnosis not present

## 2016-05-06 DIAGNOSIS — H04123 Dry eye syndrome of bilateral lacrimal glands: Secondary | ICD-10-CM | POA: Diagnosis not present

## 2016-05-17 ENCOUNTER — Other Ambulatory Visit: Payer: Self-pay | Admitting: Internal Medicine

## 2016-05-21 ENCOUNTER — Encounter: Payer: Self-pay | Admitting: Physician Assistant

## 2016-05-21 ENCOUNTER — Ambulatory Visit (INDEPENDENT_AMBULATORY_CARE_PROVIDER_SITE_OTHER): Payer: Medicare Other | Admitting: Physician Assistant

## 2016-05-21 VITALS — BP 118/64 | HR 76 | Temp 97.7°F | Resp 14 | Ht 64.5 in | Wt 147.2 lb

## 2016-05-21 DIAGNOSIS — R7303 Prediabetes: Secondary | ICD-10-CM

## 2016-05-21 DIAGNOSIS — E785 Hyperlipidemia, unspecified: Secondary | ICD-10-CM | POA: Diagnosis not present

## 2016-05-21 DIAGNOSIS — Z79899 Other long term (current) drug therapy: Secondary | ICD-10-CM

## 2016-05-21 DIAGNOSIS — K219 Gastro-esophageal reflux disease without esophagitis: Secondary | ICD-10-CM | POA: Diagnosis not present

## 2016-05-21 DIAGNOSIS — J4520 Mild intermittent asthma, uncomplicated: Secondary | ICD-10-CM | POA: Diagnosis not present

## 2016-05-21 DIAGNOSIS — R6889 Other general symptoms and signs: Secondary | ICD-10-CM | POA: Diagnosis not present

## 2016-05-21 DIAGNOSIS — I341 Nonrheumatic mitral (valve) prolapse: Secondary | ICD-10-CM | POA: Diagnosis not present

## 2016-05-21 DIAGNOSIS — D649 Anemia, unspecified: Secondary | ICD-10-CM

## 2016-05-21 DIAGNOSIS — Z0001 Encounter for general adult medical examination with abnormal findings: Secondary | ICD-10-CM

## 2016-05-21 DIAGNOSIS — Z23 Encounter for immunization: Secondary | ICD-10-CM | POA: Diagnosis not present

## 2016-05-21 DIAGNOSIS — E559 Vitamin D deficiency, unspecified: Secondary | ICD-10-CM

## 2016-05-21 DIAGNOSIS — I10 Essential (primary) hypertension: Secondary | ICD-10-CM | POA: Diagnosis not present

## 2016-05-21 DIAGNOSIS — Z0000 Encounter for general adult medical examination without abnormal findings: Secondary | ICD-10-CM

## 2016-05-21 LAB — CBC WITH DIFFERENTIAL/PLATELET
BASOS PCT: 0 %
Basophils Absolute: 0 cells/uL (ref 0–200)
EOS ABS: 260 {cells}/uL (ref 15–500)
Eosinophils Relative: 4 %
HEMATOCRIT: 39.4 % (ref 35.0–45.0)
Hemoglobin: 13.3 g/dL (ref 11.7–15.5)
LYMPHS PCT: 34 %
Lymphs Abs: 2210 cells/uL (ref 850–3900)
MCH: 31.4 pg (ref 27.0–33.0)
MCHC: 33.8 g/dL (ref 32.0–36.0)
MCV: 92.9 fL (ref 80.0–100.0)
MONO ABS: 780 {cells}/uL (ref 200–950)
MONOS PCT: 12 %
MPV: 8.7 fL (ref 7.5–12.5)
NEUTROS ABS: 3250 {cells}/uL (ref 1500–7800)
Neutrophils Relative %: 50 %
PLATELETS: 290 10*3/uL (ref 140–400)
RBC: 4.24 MIL/uL (ref 3.80–5.10)
RDW: 13.1 % (ref 11.0–15.0)
WBC: 6.5 10*3/uL (ref 3.8–10.8)

## 2016-05-21 NOTE — Progress Notes (Signed)
Medicare wellness and OV  Assessment:   1. MVP (mitral valve prolapse) controlled  2. Hypertension - CBC with Differential - BASIC METABOLIC PANEL WITH GFR - Hepatic function panel - TSH  3. GERD (gastroesophageal reflux disease) Cont prilosec  4. Other abnormal glucose Discussed general issues about diabetes pathophysiology and management., Educational material distributed., Suggested low cholesterol diet., Encouraged aerobic exercise., Discussed foot care., Reminded to get yearly retinal exam. - Hemoglobin A1c - Insulin, fasting  5. Hyperlipidemia -continue medications, check lipids, decrease fatty foods, increase activity.  - Lipid panel  6. Anemia Check CBC  7. Osteoporosis, unspecified  DEXA in 2016, get next year Declines referral to discuss injections/infusions at this time  8. Unspecified vitamin D deficiency - Vit D  25 hydroxy (rtn osteoporosis monitoring)  9. Encounter for long-term (current) use of other medications - Magnesium  10. Needs flu shot -     Flu vaccine HIGH DOSE PF     Plan:   During the course of the visit the patient was educated and counseled about appropriate screening and preventive services including:    Pneumococcal vaccine   Influenza vaccine  Td vaccine  Screening electrocardiogram  Screening mammography  Bone densitometry screening  Colorectal cancer screening  Diabetes screening  Glaucoma screening  Nutrition counseling   Subjective:   Isabel Oliver is a 75 y.o. female who presents for Medicare Annual Wellness Visit and follow up for chronic illness including HTN, chol, preDM, vitamin D def, osteoporosis.    Her blood pressure has been controlled at home, today their BP is BP: 118/64 She does workout, still works at [employer] 30 hours a week and walks a lot. She denies chest pain, shortness of breath, dizziness.  She is on cholesterol medication and denies myalgias. Her cholesterol is not at goal. The  cholesterol last visit was:   Lab Results  Component Value Date   CHOL 234 (H) 11/02/2015   HDL 59 11/02/2015   LDLCALC 135 (H) 11/02/2015   TRIG 200 (H) 11/02/2015   CHOLHDL 4.0 11/02/2015    Last A1C in the office was:  Lab Results  Component Value Date   HGBA1C 5.4 11/02/2015   Patient is on Vitamin D supplement.   Lab Results  Component Value Date   VD25OH 41 11/02/2015   She has history of osteoporosis, could not tolerate calcitonin or fosamax due to her GERD.   Names of Other Physician/Practitioners you currently use: 1. [facility]- here for primary care 2. Dr. Katy Fitch, eye doctor, last visit 2 years ago, has an appointment in Sept 3. Dr. In Adrian Blackwater, dentist, last visit q 6 months Patient Care Team: Unk Pinto, MD as PCP - General (Internal Medicine) Laurence Spates, MD as Consulting Physician (Gastroenterology) Harle Battiest, MD as Consulting Physician (Obstetrics and Gynecology) Danella Sensing, MD as Consulting Physician (Dermatology)   Medication Review Current Outpatient Prescriptions on File Prior to Visit  Medication Sig Dispense Refill  . albuterol (VENTOLIN HFA) 108 (90 Base) MCG/ACT inhaler USE 1 TO 2 INHALATIONS INTO THE LUNGS EVERY 6 HOURS AS NEEDED FOR WHEEZING OR SHORTNESS OF BREATH 18 Inhaler 3  . aspirin 81 MG chewable tablet Chew 81 mg by mouth daily.    . cetirizine (ZYRTEC) 10 MG tablet Take 10 mg by mouth daily.    . Cholecalciferol (VITAMIN D) 2000 UNITS CAPS Take by mouth.    . diclofenac sodium (VOLTAREN) 1 % GEL Apply 2 g topically 4 (four) times daily.  100 g 3  . montelukast (SINGULAIR) 10 MG tablet take 1 tablet by mouth at bedtime 90 tablet 1  . Multiple Vitamins-Minerals (MULTIVITAMIN PO) Take by mouth daily.    . Omega-3 Fatty Acids (FISH OIL PO) Take by mouth daily.    Marland Kitchen omeprazole (PRILOSEC) 20 MG capsule take 1 capsule by mouth once daily 90 capsule 1  . pravastatin (PRAVACHOL) 40 MG tablet take 1  tablet by mouth once daily for cholesterol 30 tablet 0  . Probiotic Product (PROBIOTIC DAILY PO) Take by mouth daily.    . verapamil (CALAN) 120 MG tablet take 1 tablet by mouth twice a day 180 tablet 1   No current facility-administered medications on file prior to visit.     Current Problems (verified) Patient Active Problem List   Diagnosis Date Noted  . Encounter for Medicare annual wellness exam 03/29/2015  . Medication management 12/05/2014  . Vitamin D deficiency 12/05/2014  . Prediabetes   . MVP (mitral valve prolapse)   . Hypertension   . Hyperlipidemia   . GERD (gastroesophageal reflux disease)   . Anemia   . Asthma     Screening Tests Immunization History  Administered Date(s) Administered  . Influenza, High Dose Seasonal PF 07/14/2014  . Influenza-Unspecified 06/28/2013, 07/06/2015  . Pneumococcal Conjugate-13 02/01/2014  . Pneumococcal-Unspecified 09/10/2007  . Tdap 12/05/2009  . Zoster 09/09/2005   Preventative care: Last colonoscopy: 2011 Last mammogram: 04/2016 CAT B Last pap smear/pelvic exam: 2011 remote DEXA: 07/16/2015+ osteoporsis- on D 3 and calcium  Prior vaccinations: TD or Tdap: 2011  Influenza: 2016 TODAY  Pneumococcal: 2009 Prevnar 13: 2015 Shingles/Zostavax: 2007  Allergies Allergies  Allergen Reactions  . Adhesive [Tape] Hives  . Codeine Hives  . Fosamax [Alendronate Sodium] Other (See Comments)    Reflux increase  . Penicillins Hives  . Shellfish Allergy   . Iodine Rash    SURGICAL HISTORY She  has a past surgical history that includes Appendectomy; Cholecystectomy; Eye surgery; and Tonsillectomy and adenoidectomy. FAMILY HISTORY Her family history includes Diabetes in her father; Heart disease in her father; Hyperlipidemia in her brother and sister; Hypertension in her mother. SOCIAL HISTORY She  reports that she has never smoked. She does not have any smokeless tobacco history on file. She reports that she does not drink  alcohol or use drugs.  MEDICARE WELLNESS OBJECTIVES: Tobacco use: She does not smoke.  Patient is not a former smoker. If yes, counseling given Alcohol Current alcohol use: none Osteoporosis: postmenopausal estrogen deficiency and dietary calcium and/or vitamin D deficiency, History of fracture in the past year: no Fall risk: Low Risk Hearing: normal Visual acuity: normal,  does not perform annual eye exam, every 2 years Diet: in general, a "healthy" diet   Physical activity:   Cardiac risk factors:   Depression/mood screen:   Depression screen [facility] 2/9 11/02/2015  Decreased Interest 0  Down, Depressed, Hopeless 0  PHQ - 2 Score 0    ADLs:  In your present state of health, do you have any difficulty performing the following activities: 11/02/2015  Hearing? N  Vision? N  Difficulty concentrating or making decisions? N  Walking or climbing stairs? N  Dressing or bathing? N  Doing errands, shopping? N  Some recent data might be hidden     Cognitive Testing  Alert? Yes  Normal Appearance?Yes  Oriented to person? Yes  Place? Yes   Time? Yes  Recall of three objects?  Yes  Can perform simple calculations? Yes  Displays appropriate judgment?Yes  Can read the correct time from a watch face?Yes  EOL planning: Does patient have an advance directive?: Yes Copy of advanced directive(s) in chart?: No - copy requested   Objective:     Blood pressure 118/64, pulse 76, temperature 97.7 F (36.5 C), resp. rate 14, height 5' 4.5" (1.638 m), weight 147 lb 3.2 oz (66.8 kg), SpO2 98 %. Body mass index is 24.88 kg/m.  General appearance: alert, no distress, WD/WN,  female HEENT: normocephalic, sclerae anicteric, TMs pearly, nares patent, no discharge or erythema, pharynx normal Oral cavity: MMM, no lesions Neck: supple, no lymphadenopathy, no thyromegaly, no masses Heart: RRR, normal S1, S2, no murmurs Lungs: CTA bilaterally, no wheezes, rhonchi, or rales Abdomen: +bs, soft, non  tender, non distended, no masses, no hepatomegaly, no splenomegaly Musculoskeletal: nontender, no swelling, no obvious deformity Extremities: no edema, no cyanosis, no clubbing Pulses: 2+ symmetric, upper and lower extremities, normal cap refill Neurological: alert, oriented x 3, CN2-12 intact, strength normal upper extremities and lower extremities, sensation normal throughout, DTRs 2+ throughout, no cerebellar signs, gait normal Psychiatric: normal affect, behavior normal, pleasant  Breast: defer Gyn: defer Rectal: defer  Medicare Attestation I have personally reviewed: The patient's medical and social history Their use of alcohol, tobacco or illicit drugs Their current medications and supplements The patient's functional ability including ADLs,fall risks, home safety risks, cognitive, and hearing and visual impairment Diet and physical activities Evidence for depression or mood disorders  The patient's weight, height, BMI, and visual acuity have been recorded in the chart.  I have made referrals, counseling, and provided education to the patient based on review of the above and I have provided the patient with a written personalized care plan for preventive services.     Vicie Mutters, PA-C   05/21/2016

## 2016-05-22 LAB — HEPATIC FUNCTION PANEL
ALK PHOS: 61 U/L (ref 33–130)
ALT: 11 U/L (ref 6–29)
AST: 16 U/L (ref 10–35)
Albumin: 4.2 g/dL (ref 3.6–5.1)
BILIRUBIN INDIRECT: 0.2 mg/dL (ref 0.2–1.2)
BILIRUBIN TOTAL: 0.3 mg/dL (ref 0.2–1.2)
Bilirubin, Direct: 0.1 mg/dL (ref <=0.2)
Total Protein: 6.6 g/dL (ref 6.1–8.1)

## 2016-05-22 LAB — BASIC METABOLIC PANEL WITH GFR
BUN: 16 mg/dL (ref 7–25)
CHLORIDE: 106 mmol/L (ref 98–110)
CO2: 23 mmol/L (ref 20–31)
Calcium: 10.2 mg/dL (ref 8.6–10.4)
Creat: 0.68 mg/dL (ref 0.60–0.93)
GFR, EST NON AFRICAN AMERICAN: 87 mL/min (ref >=60)
Glucose, Bld: 94 mg/dL (ref 65–99)
POTASSIUM: 4.6 mmol/L (ref 3.5–5.3)
Sodium: 141 mmol/L (ref 135–146)

## 2016-05-22 LAB — TSH: TSH: 0.51 mIU/L

## 2016-05-22 LAB — LIPID PANEL
CHOL/HDL RATIO: 3.6 ratio (ref <=5.0)
Cholesterol: 183 mg/dL (ref 125–200)
HDL: 51 mg/dL (ref >=46)
LDL CALC: 71 mg/dL (ref <130)
TRIGLYCERIDES: 304 mg/dL — AB (ref <150)
VLDL: 61 mg/dL — AB (ref <30)

## 2016-05-22 LAB — MAGNESIUM: Magnesium: 2.2 mg/dL (ref 1.5–2.5)

## 2016-05-22 LAB — VITAMIN D 25 HYDROXY (VIT D DEFICIENCY, FRACTURES): Vit D, 25-Hydroxy: 97 ng/mL (ref 30–100)

## 2016-07-11 ENCOUNTER — Other Ambulatory Visit: Payer: Self-pay | Admitting: Internal Medicine

## 2016-07-30 ENCOUNTER — Other Ambulatory Visit: Payer: Self-pay | Admitting: Internal Medicine

## 2016-08-28 ENCOUNTER — Ambulatory Visit (INDEPENDENT_AMBULATORY_CARE_PROVIDER_SITE_OTHER): Payer: Medicare Other | Admitting: Physician Assistant

## 2016-08-28 ENCOUNTER — Encounter: Payer: Self-pay | Admitting: Physician Assistant

## 2016-08-28 VITALS — BP 134/60 | HR 86 | Temp 97.5°F | Resp 14 | Ht 64.5 in | Wt 147.8 lb

## 2016-08-28 DIAGNOSIS — E785 Hyperlipidemia, unspecified: Secondary | ICD-10-CM

## 2016-08-28 DIAGNOSIS — R7303 Prediabetes: Secondary | ICD-10-CM | POA: Diagnosis not present

## 2016-08-28 DIAGNOSIS — E559 Vitamin D deficiency, unspecified: Secondary | ICD-10-CM | POA: Diagnosis not present

## 2016-08-28 DIAGNOSIS — Z79899 Other long term (current) drug therapy: Secondary | ICD-10-CM | POA: Diagnosis not present

## 2016-08-28 DIAGNOSIS — I10 Essential (primary) hypertension: Secondary | ICD-10-CM | POA: Diagnosis not present

## 2016-08-28 LAB — CBC WITH DIFFERENTIAL/PLATELET
BASOS ABS: 0 {cells}/uL (ref 0–200)
Basophils Relative: 0 %
EOS ABS: 146 {cells}/uL (ref 15–500)
Eosinophils Relative: 2 %
HEMATOCRIT: 38.5 % (ref 35.0–45.0)
HEMOGLOBIN: 12.8 g/dL (ref 11.7–15.5)
LYMPHS ABS: 2044 {cells}/uL (ref 850–3900)
Lymphocytes Relative: 28 %
MCH: 30.9 pg (ref 27.0–33.0)
MCHC: 33.2 g/dL (ref 32.0–36.0)
MCV: 93 fL (ref 80.0–100.0)
MONO ABS: 803 {cells}/uL (ref 200–950)
MPV: 8.7 fL (ref 7.5–12.5)
Monocytes Relative: 11 %
NEUTROS PCT: 59 %
Neutro Abs: 4307 cells/uL (ref 1500–7800)
Platelets: 313 10*3/uL (ref 140–400)
RBC: 4.14 MIL/uL (ref 3.80–5.10)
RDW: 13.1 % (ref 11.0–15.0)
WBC: 7.3 10*3/uL (ref 3.8–10.8)

## 2016-08-28 LAB — TSH: TSH: 0.66 mIU/L

## 2016-08-28 NOTE — Progress Notes (Signed)
Assessment and Plan:  1. Hypertension -Continue medication, monitor blood pressure at home. Continue DASH diet.  Reminder to go to the ER if any CP, SOB, nausea, dizziness, severe HA, changes vision/speech, left arm numbness and tingling and jaw pain.  2. Cholesterol -Continue diet and exercise. Check cholesterol.   3. Prediabetes  -Continue diet and exercise. Check A1C  4. Vitamin D Def - check level and continue medications.   Continue diet and meds as discussed. Further disposition pending results of labs. Over 30 minutes of exam, counseling, chart review, and critical decision making was performed  Future Appointments Date Time Provider [place]  06/03/2017 2:00 PM Vicie Mutters, PA-C [id number] None     HPI 75 y.o. female  presents for 3 month follow up on hypertension, cholesterol, prediabetes, and vitamin D deficiency.   Her blood pressure has been controlled at home, today their BP is BP: 134/60  She does workout. She denies chest pain, shortness of breath, dizziness.  She is on cholesterol medication and denies myalgias. Her cholesterol is at goal. The cholesterol last visit was:   Lab Results  Component Value Date   CHOL 183 05/21/2016   HDL 51 05/21/2016   LDLCALC 71 05/21/2016   TRIG 304 (H) 05/21/2016   CHOLHDL 3.6 05/21/2016    She has been working on diet and exercise for prediabetes, and denies paresthesia of the feet, polydipsia, polyuria and visual disturbances. Last A1C in the office was:  Lab Results  Component Value Date   HGBA1C 5.4 11/02/2015   Patient is on Vitamin D supplement.   Lab Results  Component Value Date   VD25OH 97 05/21/2016     BMI is Body mass index is 24.98 kg/m., she is working on diet and exercise. Wt Readings from Last 3 Encounters:  08/28/16 147 lb 12.8 oz (67 kg)  05/21/16 147 lb 3.2 oz (66.8 kg)  11/02/15 143 lb 4.8 oz (65 kg)    Current Medications:  Current Outpatient Prescriptions on File Prior to Visit   Medication Sig Dispense Refill  . albuterol (VENTOLIN HFA) 108 (90 Base) MCG/ACT inhaler USE 1 TO 2 INHALATIONS INTO THE LUNGS EVERY 6 HOURS AS NEEDED FOR WHEEZING OR SHORTNESS OF BREATH 18 Inhaler 3  . aspirin 81 MG chewable tablet Chew 81 mg by mouth daily.    . Calcium Carb-Cholecalciferol 1000-800 MG-UNIT TABS Take by mouth.    . cetirizine (ZYRTEC) 10 MG tablet Take 10 mg by mouth daily.    . Cholecalciferol (VITAMIN D) 2000 UNITS CAPS Take by mouth.    . diclofenac sodium (VOLTAREN) 1 % GEL Apply 2 g topically 4 (four) times daily. 100 g 3  . montelukast (SINGULAIR) 10 MG tablet take 1 tablet by mouth at bedtime 90 tablet 1  . Multiple Vitamins-Minerals (MULTIVITAMIN PO) Take by mouth daily.    . Omega-3 Fatty Acids (FISH OIL PO) Take by mouth daily.    Marland Kitchen omeprazole (PRILOSEC) 20 MG capsule take 1 capsule by mouth once daily 90 capsule 1  . pravastatin (PRAVACHOL) 40 MG tablet take 1 tablet by mouth once daily for cholesterol 30 tablet 0  . Probiotic Product (PROBIOTIC DAILY PO) Take by mouth daily.    . verapamil (CALAN) 120 MG tablet take 1 tablet by mouth twice a day 180 tablet 1   No current facility-administered medications on file prior to visit.    Medical History:  Past Medical History:  Diagnosis Date  . Anemia   . Asthma   .  GERD (gastroesophageal reflux disease)   . Goiter   . Hyperlipidemia   . Hypertension   . MVP (mitral valve prolapse)   . Osteopenia   . Other abnormal glucose    Allergies:  Allergies  Allergen Reactions  . Adhesive [Tape] Hives  . Codeine Hives  . Fosamax [Alendronate Sodium] Other (See Comments)    Reflux increase  . Penicillins Hives  . Shellfish Allergy   . Iodine Rash     Review of Systems:  Review of Systems  Constitutional: Negative.   HENT: Negative.   Eyes: Negative.   Respiratory: Negative.   Cardiovascular: Negative.   Gastrointestinal: Negative.   Genitourinary: Negative.   Musculoskeletal: Negative.   Skin:  Negative.   Neurological: Negative.   Endo/Heme/Allergies: Negative.   Psychiatric/Behavioral: Negative.     Family history- Review and unchanged Social history- Review and unchanged Physical Exam: BP 134/60   Pulse 86   Temp 97.5 F (36.4 C)   Resp 14   Ht 5' 4.5" (1.638 m)   Wt 147 lb 12.8 oz (67 kg)   SpO2 98%   BMI 24.98 kg/m  Wt Readings from Last 3 Encounters:  08/28/16 147 lb 12.8 oz (67 kg)  05/21/16 147 lb 3.2 oz (66.8 kg)  11/02/15 143 lb 4.8 oz (65 kg)   General Appearance: Well nourished, in no apparent distress. Eyes: PERRLA, EOMs, conjunctiva no swelling or erythema Sinuses: No Frontal/maxillary tenderness ENT/Mouth: Ext aud canals clear, TMs without erythema, bulging. No erythema, swelling, or exudate on post pharynx.  Tonsils not swollen or erythematous. Hearing normal.  Neck: Supple, thyroid normal.  Respiratory: Respiratory effort normal, BS equal bilaterally without rales, rhonchi, wheezing or stridor.  Cardio: RRR with no MRGs. Brisk peripheral pulses without edema.  Abdomen: Soft, + BS,  Non tender, no guarding, rebound, hernias, masses. Lymphatics: Non tender without lymphadenopathy.  Musculoskeletal: Full ROM, 5/5 strength, Normal gait Skin: Warm, dry without rashes, lesions, ecchymosis.  Neuro: Cranial nerves intact. Normal muscle tone, no cerebellar symptoms. Psych: Awake and oriented X 3, normal affect, Insight and Judgment appropriate.    Vicie Mutters, PA-C 2:45 PM [facility]

## 2016-08-29 LAB — LIPID PANEL
CHOLESTEROL: 185 mg/dL (ref <200)
HDL: 60 mg/dL (ref >50)
LDL Cholesterol: 97 mg/dL (ref <100)
Total CHOL/HDL Ratio: 3.1 Ratio (ref <5.0)
Triglycerides: 139 mg/dL (ref <150)
VLDL: 28 mg/dL (ref <30)

## 2016-08-29 LAB — HEPATIC FUNCTION PANEL
ALT: 13 U/L (ref 6–29)
AST: 18 U/L (ref 10–35)
Albumin: 4.1 g/dL (ref 3.6–5.1)
Alkaline Phosphatase: 63 U/L (ref 33–130)
BILIRUBIN INDIRECT: 0.2 mg/dL (ref 0.2–1.2)
Bilirubin, Direct: 0.1 mg/dL (ref <=0.2)
TOTAL PROTEIN: 6.5 g/dL (ref 6.1–8.1)
Total Bilirubin: 0.3 mg/dL (ref 0.2–1.2)

## 2016-08-29 LAB — BASIC METABOLIC PANEL WITH GFR
BUN: 17 mg/dL (ref 7–25)
CALCIUM: 10 mg/dL (ref 8.6–10.4)
CO2: 21 mmol/L (ref 20–31)
Chloride: 104 mmol/L (ref 98–110)
Creat: 0.65 mg/dL (ref 0.60–0.93)
GFR, Est African American: >89 mL/min (ref >=60)
GFR, Est Non African American: 88 mL/min (ref >=60)
GLUCOSE: 100 mg/dL — AB (ref 65–99)
Potassium: 4.5 mmol/L (ref 3.5–5.3)
Sodium: 139 mmol/L (ref 135–146)

## 2016-08-29 LAB — MAGNESIUM: MAGNESIUM: 2.1 mg/dL (ref 1.5–2.5)

## 2016-09-05 ENCOUNTER — Other Ambulatory Visit: Payer: Self-pay | Admitting: Internal Medicine

## 2016-09-21 ENCOUNTER — Other Ambulatory Visit: Payer: Self-pay | Admitting: Internal Medicine

## 2016-10-02 ENCOUNTER — Encounter (HOSPITAL_COMMUNITY): Payer: Self-pay | Admitting: Emergency Medicine

## 2016-10-02 ENCOUNTER — Ambulatory Visit (HOSPITAL_COMMUNITY)
Admission: EM | Admit: 2016-10-02 | Discharge: 2016-10-02 | Disposition: A | Discharge status: Home / Self Care | Payer: Medicare Other | Attending: Emergency Medicine | Admitting: Emergency Medicine

## 2016-10-02 DIAGNOSIS — J40 Bronchitis, not specified as acute or chronic: Secondary | ICD-10-CM | POA: Diagnosis not present

## 2016-10-02 DIAGNOSIS — R059 Cough, unspecified: Secondary | ICD-10-CM

## 2016-10-02 DIAGNOSIS — R6889 Other general symptoms and signs: Secondary | ICD-10-CM | POA: Diagnosis not present

## 2016-10-02 DIAGNOSIS — R05 Cough: Secondary | ICD-10-CM

## 2016-10-02 MED ORDER — METHYLPREDNISOLONE 4 MG PO TBPK: ORAL_TABLET | ORAL | 0 refills | Status: DC

## 2016-10-02 MED ORDER — BENZONATATE 100 MG PO CAPS: 200.0000 mg | ORAL_CAPSULE | Freq: Three times a day (TID) | ORAL | 0 refills | Status: DC | PRN

## 2016-10-02 MED ORDER — AZITHROMYCIN 250 MG PO TABS: 250.0000 mg | ORAL_TABLET | Freq: Every day | ORAL | 0 refills | Status: DC

## 2016-10-02 MED ORDER — OSELTAMIVIR PHOSPHATE 75 MG PO CAPS: 75.0000 mg | ORAL_CAPSULE | Freq: Two times a day (BID) | ORAL | 0 refills | Status: DC

## 2016-10-02 NOTE — ED Provider Notes (Signed)
CSN: [id number]     Arrival date & time 10/02/16  1720 History   First MD Initiated Contact with Patient 10/02/16 1826     Chief Complaint  Patient presents with  . URI   (Consider location/radiation/quality/duration/timing/severity/associated sxs/prior Treatment) Patient c/o cough flare with her asthma and URI sx's for a week.    URI  Presenting symptoms: congestion, cough, fatigue and rhinorrhea   Rhinorrhea:    Quality:  Clear   Severity:  Moderate   Duration:  1 week   Timing:  Constant   Progression:  Worsening Severity:  Moderate Duration:  1 week Timing:  Constant Progression:  Worsening Relieved by:  Nothing Worsened by:  Nothing   Past Medical History:  Diagnosis Date  . Anemia   . Asthma   . GERD (gastroesophageal reflux disease)   . Goiter   . Hyperlipidemia   . Hypertension   . MVP (mitral valve prolapse)   . Osteopenia   . Other abnormal glucose    Past Surgical History:  Procedure Laterality Date  . APPENDECTOMY    . CHOLECYSTECTOMY    . EYE SURGERY    . TONSILLECTOMY AND ADENOIDECTOMY     Family History  Problem Relation Age of Onset  . Hypertension Mother   . Diabetes Father   . Heart disease Father   . Hyperlipidemia Sister   . Hyperlipidemia Brother    Social History  Substance Use Topics  . Smoking status: Never Smoker  . Smokeless tobacco: Not on file  . Alcohol use No   OB History    No data available     Review of Systems  Constitutional: Positive for fatigue.  HENT: Positive for congestion and rhinorrhea.   Eyes: Negative.   Respiratory: Positive for cough.   Cardiovascular: Negative.   Gastrointestinal: Negative.   Endocrine: Negative.   Genitourinary: Negative.   Allergic/Immunologic: Negative.   Neurological: Negative.   Hematological: Negative.   Psychiatric/Behavioral: Negative.     Allergies  Adhesive [tape]; Codeine; Fosamax [alendronate sodium]; Penicillins; Shellfish allergy; and Iodine  Home  Medications   Prior to Admission medications   Medication Sig Start Date End Date Taking? Authorizing Provider  albuterol (VENTOLIN HFA) 108 (90 Base) MCG/ACT inhaler USE 1 TO 2 INHALATIONS INTO THE LUNGS EVERY 6 HOURS AS NEEDED FOR WHEEZING OR SHORTNESS OF BREATH 09/28/15   Unk Pinto, MD  aspirin 81 MG chewable tablet Chew 81 mg by mouth daily.    Historical Provider, MD  azithromycin (ZITHROMAX) 250 MG tablet Take 1 tablet (250 mg total) by mouth daily. Take first 2 tablets together, then 1 every day until finished. 10/02/16   Lysbeth Penner, FNP  benzonatate (TESSALON) 100 MG capsule Take 2 capsules (200 mg total) by mouth 3 (three) times daily as needed for cough. 10/02/16   Lysbeth Penner, FNP  Calcium Carb-Cholecalciferol 1000-800 MG-UNIT TABS Take by mouth.    Historical Provider, MD  cetirizine (ZYRTEC) 10 MG tablet Take 10 mg by mouth daily.    Historical Provider, MD  Cholecalciferol (VITAMIN D) 2000 UNITS CAPS Take by mouth.    Historical Provider, MD  cyanocobalamin 500 MCG tablet Take 500 mcg by mouth daily.    Historical Provider, MD  diclofenac sodium (VOLTAREN) 1 % GEL Apply 2 g topically 4 (four) times daily. 12/16/14   Courtney Forcucci, PA-C  methylPREDNISolone (MEDROL DOSEPAK) 4 MG TBPK tablet Take 6-5-4-3-2-1 po qd 10/02/16   Lysbeth Penner, FNP  montelukast (SINGULAIR) 10 MG tablet  take 1 tablet by mouth at bedtime 07/11/16   Unk Pinto, MD  Multiple Vitamins-Minerals (MULTIVITAMIN PO) Take by mouth daily.    Historical Provider, MD  Omega-3 Fatty Acids (FISH OIL PO) Take by mouth daily.    Historical Provider, MD  omeprazole (PRILOSEC) 20 MG capsule take 1 capsule by mouth once daily 05/17/16   Unk Pinto, MD  oseltamivir (TAMIFLU) 75 MG capsule Take 1 capsule (75 mg total) by mouth every 12 (twelve) hours. 10/02/16   Lysbeth Penner, FNP  pravastatin (PRAVACHOL) 40 MG tablet take 1 tablet by mouth once daily for cholesterol 09/05/16   Unk Pinto, MD   Probiotic Product (PROBIOTIC DAILY PO) Take by mouth daily.    Historical Provider, MD  verapamil (CALAN) 120 MG tablet take 1 tablet by mouth twice a day 09/22/16   Vicie Mutters, PA-C   Meds Ordered and Administered this Visit  Medications - No data to display  BP 144/77 (BP Location: Left Arm)   Pulse 86   Temp 100.4 F (38 C) (Oral)   Resp 22   SpO2 95%  No data found.   Physical Exam  Constitutional: She appears well-developed and well-nourished.  HENT:  Head: Normocephalic and atraumatic.  Right Ear: External ear normal.  Left Ear: External ear normal.  Mouth/Throat: Oropharynx is clear and moist.  Eyes: Conjunctivae and EOM are normal. Pupils are equal, round, and reactive to light.  Neck: Normal range of motion. Neck supple.  Cardiovascular: Normal rate, regular rhythm and normal heart sounds.   Pulmonary/Chest: Effort normal. She has wheezes.  Abdominal: Soft. Bowel sounds are normal.  Nursing note and vitals reviewed.   Urgent Care Course     Procedures (including critical care time)  Labs Review Labs Reviewed - No data to display  Imaging Review No results found.   Visual Acuity Review  Right Eye Distance:   Left Eye Distance:   Bilateral Distance:    Right Eye Near:   Left Eye Near:    Bilateral Near:         MDM   1. Bronchitis   2. Cough   3. Flu-like symptoms    Medrol dose pack Zpak Tamiflu Tessalon Perles  Push po fluids, rest, tylenol and motrin otc prn as directed for fever, arthralgias, and myalgias.  Follow up prn if sx's continue or persist.    Lysbeth Penner, FNP 10/02/16 [phone]

## 2016-10-02 NOTE — ED Triage Notes (Signed)
Patient started with symptoms on Monday.  Aching, fever and cough.  Today started wheezing.  Patient says her spouse was admitted with flu/pneumonia

## 2016-11-15 ENCOUNTER — Other Ambulatory Visit: Payer: Self-pay | Admitting: Internal Medicine

## 2016-12-29 NOTE — Patient Instructions (Signed)

## 2016-12-29 NOTE — Progress Notes (Signed)
This very nice 76 y.o. MWF presents for 3 month follow up with Hypertension, Hyperlipidemia, Pre-Diabetes and Vitamin D Deficiency. Patient has GERD controlled on current meds.      Patient is treated for HTN (1995) & BP has been controlled at home. Today's BP is at goal - 128/64. Patient has had no complaints of any cardiac type chest pain, palpitations, dyspnea/orthopnea/PND, dizziness, claudication, or dependent edema.     Hyperlipidemia is controlled with diet & meds. Patient denies myalgias or other med SE's. Last Lipids were at goal: Lab Results  Component Value Date   CHOL 185 08/28/2016   HDL 60 08/28/2016   LDLCALC 97 08/28/2016   TRIG 139 08/28/2016   CHOLHDL 3.1 08/28/2016      Also, the patient has history of PreDiabetes (A1c 6.0% in 2014 and 5.1% in Aug 2016) and has had no symptoms of reactive hypoglycemia, diabetic polys, paresthesias or visual blurring.  Last A1c was at goal: Lab Results  Component Value Date   HGBA1C 5.4 11/02/2015      Further, the patient also has history of Vitamin D Deficiency and supplements vitamin D without any suspected side-effects. Last vitamin D was at goal:  Lab Results  Component Value Date   VD25OH 97 05/21/2016   Current Outpatient Prescriptions on File Prior to Visit  Medication Sig  . VENTOLIN HFA inhaler 1 TO 2 INHALATIONSS EVERY 6 HOURS   . aspirin 81 MG  Chew 81 mg by mouth daily.  . Calcium - Vit D  1000-800   Take by mouth.  . Cetirizine 10 MG tablet Take 10 mg by mouth daily.  Marland Kitchen VITAMIN D 2000 UNITS  Take by mouth.  . Vit B12  500 MCG tab Take 500 mcg by mouth daily.  .  VOLTAREN 1 % GEL Apply 2 g topically 4 (four) times daily.  . montelukast  10 MG  take 1 tablet by mouth at bedtime  . Multi-Vit w/Min Take by mouth daily.  . Omega-3 FISH OIL Take by mouth daily.  Marland Kitchen omeprazole 20 MG  take 1 capsule by mouth once daily  . pravastatin  40 MG  take 1 tablet by mouth once daily for cholesterol  . Probiotic  Take by  mouth daily.  . verapamil  120 MG take 1 tablet by mouth twice a day    Allergies  Allergen Reactions  . Adhesive [Tape] Hives  . Codeine Hives  . Fosamax [Alendronate Sodium] Other (See Comments)    Reflux increase  . Penicillins Hives  . Shellfish Allergy   . Iodine Rash   PMHx:   Past Medical History:  Diagnosis Date  . Anemia   . Asthma   . GERD (gastroesophageal reflux disease)   . Goiter   . Hyperlipidemia   . Hypertension   . MVP (mitral valve prolapse)   . Osteopenia   . Other abnormal glucose    Immunization History  Administered Date(s) Administered  . Influenza, High Dose Seasonal PF 07/14/2014, 05/21/2016  . Influenza-Unspecified 06/28/2013, 07/06/2015  . Pneumococcal Conjugate-13 02/01/2014  . Pneumococcal-Unspecified 09/10/2007  . Tdap 12/05/2009  . Zoster 09/09/2005   Past Surgical History:  Procedure Laterality Date  . APPENDECTOMY    . CHOLECYSTECTOMY    . EYE SURGERY    . TONSILLECTOMY AND ADENOIDECTOMY     FHx:    Reviewed / unchanged  SHx:    Reviewed / unchanged  Systems Review:  Constitutional: Denies fever, chills, wt  changes, headaches, insomnia, fatigue, night sweats, change in appetite. Eyes: Denies redness, blurred vision, diplopia, discharge, itchy, watery eyes.  ENT: Denies discharge, congestion, post nasal drip, epistaxis, sore throat, earache, hearing loss, dental pain, tinnitus, vertigo, sinus pain, snoring.  CV: Denies chest pain, palpitations, irregular heartbeat, syncope, dyspnea, diaphoresis, orthopnea, PND, claudication or edema. Respiratory: denies cough, dyspnea, DOE, pleurisy, hoarseness, laryngitis, wheezing.  Gastrointestinal: Denies dysphagia, odynophagia, heartburn, reflux, water brash, abdominal pain or cramps, nausea, vomiting, bloating, diarrhea, constipation, hematemesis, melena, hematochezia  or hemorrhoids. Genitourinary: Denies dysuria, frequency, urgency, nocturia, hesitancy, discharge, hematuria or flank  pain. Musculoskeletal: Denies arthralgias, myalgias, stiffness, jt. swelling, pain, limping or strain/sprain.  Skin: Denies pruritus, rash, hives, warts, acne, eczema or change in skin lesion(s). Neuro: No weakness, tremor, incoordination, spasms, paresthesia or pain. Psychiatric: Denies confusion, memory loss or sensory loss. Endo: Denies change in weight, skin or hair change.  Heme/Lymph: No excessive bleeding, bruising or enlarged lymph nodes.  Physical Exam  BP 128/64   Pulse 76   Temp 97.6 F (36.4 C)   Resp 16   Ht 5' 4.5" (1.638 m)   Wt 147 lb 6.4 oz (66.9 kg)   BMI 24.91 kg/m   Appears well nourished, well groomed  and in no distress.  Eyes: PERRLA, EOMs, conjunctiva no swelling or erythema. Sinuses: No frontal/maxillary tenderness ENT/Mouth: EAC's clear, TM's nl w/o erythema, bulging. Nares clear w/o erythema, swelling, exudates. Oropharynx clear without erythema or exudates. Oral hygiene is good. Tongue normal, non obstructing. Hearing intact.  Neck: Supple. Thyroid nl. Car 2+/2+ without bruits, nodes or JVD. Chest: Respirations nl with BS clear & equal w/o rales, rhonchi, wheezing or stridor.  Cor: Heart sounds normal w/ regular rate and rhythm without sig. murmurs, gallops, clicks or rubs. Peripheral pulses normal and equal  without edema.  Abdomen: Soft & bowel sounds normal. Non-tender w/o guarding, rebound, hernias, masses or organomegaly.  Lymphatics: Unremarkable.  Musculoskeletal: Full ROM all peripheral extremities, joint stability, 5/5 strength and normal gait.  Skin: Warm, dry without exposed rashes, lesions or ecchymosis apparent.  Neuro: Cranial nerves intact, reflexes equal bilaterally. Sensory-motor testing grossly intact. Tendon reflexes grossly intact.  Pysch: Alert & oriented x 3.  Insight and judgement nl & appropriate. No ideations.  Assessment and Plan:  1. Essential hypertension  - Continue medication, monitor blood pressure at home.  - Continue  DASH diet. Reminder to go to the ER if any CP,  SOB, nausea, dizziness, severe HA, changes vision/speech,  left arm numbness and tingling and jaw pain.  - CBC with Differential/Platelet - BASIC METABOLIC PANEL WITH GFR - Magnesium - TSH  2. Mixed hyperlipidemia  - Continue diet/meds, exercise,& lifestyle modifications.  - Continue monitor periodic cholesterol/liver & renal functions   - Hepatic function panel - Lipid panel - TSH  3. Prediabetes  - Continue diet, exercise, lifestyle modifications.  - Monitor appropriate labs.  - Hemoglobin A1c  4. Vitamin D deficiency  - Vit D level - Continue supplementation.  5. Gastroesophageal reflux disease   6. Medication management - CBC with Differential/Platelet - BASIC METABOLIC PANEL WITH GFR - Hepatic function panel - Magnesium - Lipid panel - TSH - Hemoglobin A1c - VITAMIN D 25 Hydroxy       Discussed  regular exercise, BP monitoring, weight control to achieve/maintain BMI less than 25 and discussed med and SE's. Recommended labs to assess and monitor clinical status with further disposition pending results of labs. Over 30 minutes of exam, counseling, chart review  was performed.

## 2016-12-30 ENCOUNTER — Ambulatory Visit (INDEPENDENT_AMBULATORY_CARE_PROVIDER_SITE_OTHER): Payer: Medicare Other | Admitting: Internal Medicine

## 2016-12-30 ENCOUNTER — Encounter: Payer: Self-pay | Admitting: Internal Medicine

## 2016-12-30 VITALS — BP 128/64 | HR 76 | Temp 97.6°F | Resp 16 | Ht 64.5 in | Wt 147.4 lb

## 2016-12-30 DIAGNOSIS — E782 Mixed hyperlipidemia: Secondary | ICD-10-CM | POA: Diagnosis not present

## 2016-12-30 DIAGNOSIS — I10 Essential (primary) hypertension: Secondary | ICD-10-CM | POA: Diagnosis not present

## 2016-12-30 DIAGNOSIS — R7303 Prediabetes: Secondary | ICD-10-CM

## 2016-12-30 DIAGNOSIS — E559 Vitamin D deficiency, unspecified: Secondary | ICD-10-CM | POA: Diagnosis not present

## 2016-12-30 DIAGNOSIS — Z79899 Other long term (current) drug therapy: Secondary | ICD-10-CM

## 2016-12-30 DIAGNOSIS — K219 Gastro-esophageal reflux disease without esophagitis: Secondary | ICD-10-CM

## 2016-12-30 LAB — HEPATIC FUNCTION PANEL
ALT: 12 U/L (ref 6–29)
AST: 16 U/L (ref 10–35)
Albumin: 4.1 g/dL (ref 3.6–5.1)
Alkaline Phosphatase: 69 U/L (ref 33–130)
BILIRUBIN INDIRECT: 0.3 mg/dL (ref 0.2–1.2)
BILIRUBIN TOTAL: 0.4 mg/dL (ref 0.2–1.2)
Bilirubin, Direct: 0.1 mg/dL (ref <=0.2)
TOTAL PROTEIN: 6.3 g/dL (ref 6.1–8.1)

## 2016-12-30 LAB — BASIC METABOLIC PANEL WITH GFR
BUN: 18 mg/dL (ref 7–25)
CO2: 25 mmol/L (ref 20–31)
CREATININE: 0.61 mg/dL (ref 0.60–0.93)
Calcium: 10 mg/dL (ref 8.6–10.4)
Chloride: 106 mmol/L (ref 98–110)
GFR, Est Non African American: >89 mL/min (ref >=60)
Glucose, Bld: 97 mg/dL (ref 65–99)
POTASSIUM: 4.3 mmol/L (ref 3.5–5.3)
Sodium: 141 mmol/L (ref 135–146)

## 2016-12-30 LAB — CBC WITH DIFFERENTIAL/PLATELET
BASOS PCT: 0 %
Basophils Absolute: 0 cells/uL (ref 0–200)
EOS ABS: 222 {cells}/uL (ref 15–500)
EOS PCT: 3 %
HCT: 40.4 % (ref 35.0–45.0)
Hemoglobin: 13.4 g/dL (ref 11.7–15.5)
LYMPHS PCT: 30 %
Lymphs Abs: 2220 cells/uL (ref 850–3900)
MCH: 30.9 pg (ref 27.0–33.0)
MCHC: 33.2 g/dL (ref 32.0–36.0)
MCV: 93.1 fL (ref 80.0–100.0)
MONOS PCT: 11 %
MPV: 9.1 fL (ref 7.5–12.5)
Monocytes Absolute: 814 cells/uL (ref 200–950)
NEUTROS ABS: 4144 {cells}/uL (ref 1500–7800)
Neutrophils Relative %: 56 %
PLATELETS: 290 10*3/uL (ref 140–400)
RBC: 4.34 MIL/uL (ref 3.80–5.10)
RDW: 13.3 % (ref 11.0–15.0)
WBC: 7.4 10*3/uL (ref 3.8–10.8)

## 2016-12-30 LAB — LIPID PANEL
Cholesterol: 167 mg/dL (ref <200)
HDL: 56 mg/dL (ref >50)
LDL CALC: 76 mg/dL (ref <100)
TRIGLYCERIDES: 176 mg/dL — AB (ref <150)
Total CHOL/HDL Ratio: 3 Ratio (ref <5.0)
VLDL: 35 mg/dL — AB (ref <30)

## 2016-12-30 LAB — TSH: TSH: 0.28 mIU/L — ABNORMAL LOW

## 2016-12-31 ENCOUNTER — Other Ambulatory Visit: Payer: Self-pay | Admitting: Internal Medicine

## 2016-12-31 DIAGNOSIS — E0590 Thyrotoxicosis, unspecified without thyrotoxic crisis or storm: Secondary | ICD-10-CM

## 2016-12-31 LAB — VITAMIN D 25 HYDROXY (VIT D DEFICIENCY, FRACTURES): Vit D, 25-Hydroxy: 99 ng/mL (ref 30–100)

## 2016-12-31 LAB — MAGNESIUM: Magnesium: 2.1 mg/dL (ref 1.5–2.5)

## 2016-12-31 LAB — HEMOGLOBIN A1C
Hgb A1c MFr Bld: 5 % (ref <5.7)
Mean Plasma Glucose: 97 mg/dL

## 2017-01-20 ENCOUNTER — Other Ambulatory Visit: Payer: Self-pay | Admitting: Internal Medicine

## 2017-02-11 ENCOUNTER — Other Ambulatory Visit: Payer: Self-pay | Admitting: *Deleted

## 2017-02-11 ENCOUNTER — Ambulatory Visit (INDEPENDENT_AMBULATORY_CARE_PROVIDER_SITE_OTHER): Payer: Medicare Other | Admitting: *Deleted

## 2017-02-11 DIAGNOSIS — E039 Hypothyroidism, unspecified: Secondary | ICD-10-CM | POA: Diagnosis not present

## 2017-02-11 DIAGNOSIS — E0590 Thyrotoxicosis, unspecified without thyrotoxic crisis or storm: Secondary | ICD-10-CM

## 2017-02-11 NOTE — Addendum Note (Signed)
Addended by: Melbourne Abts C on: 02/11/2017 02:51 PM   Modules accepted: Orders

## 2017-02-11 NOTE — Progress Notes (Signed)
Patient here for a NV to recheck her TSH.  The patient is not on thyroid medication currently.

## 2017-02-12 LAB — TSH: TSH: 0.47 m[IU]/L

## 2017-02-26 ENCOUNTER — Other Ambulatory Visit: Payer: Self-pay | Admitting: Internal Medicine

## 2017-03-11 DIAGNOSIS — D3611 Benign neoplasm of peripheral nerves and autonomic nervous system of face, head, and neck: Secondary | ICD-10-CM | POA: Diagnosis not present

## 2017-03-11 DIAGNOSIS — L821 Other seborrheic keratosis: Secondary | ICD-10-CM | POA: Diagnosis not present

## 2017-03-11 DIAGNOSIS — D2262 Melanocytic nevi of left upper limb, including shoulder: Secondary | ICD-10-CM | POA: Diagnosis not present

## 2017-03-23 ENCOUNTER — Other Ambulatory Visit: Payer: Self-pay | Admitting: Physician Assistant

## 2017-04-13 NOTE — Progress Notes (Signed)
Assessment and Plan:   Essential hypertension - continue medications, DASH diet, exercise and monitor at home. Call if greater than 130/80. -     CBC with Differential/Platelet -     Hepatic function panel -     BASIC METABOLIC PANEL WITH GFR -     TSH  Hyperlipidemia, unspecified hyperlipidemia type -continue medications, check lipids, decrease fatty foods, increase activity.  -     Lipid panel  Medication management -     Magnesium  Urinary frequency -     Urinalysis, Routine w reflex microscopic -     Urine Culture   Continue diet and meds as discussed. Further disposition pending results of labs. Over 30 minutes of exam, counseling, chart review, and critical decision making was performed  Future Appointments Date Time Provider [place]  07/24/2017 3:00 PM Vicie Mutters, PA-C [id number] None     HPI 76 y.o. female  presents for 3 month follow up on hypertension, cholesterol, prediabetes, and vitamin D deficiency.  She has had some frequency and urgency the last week with pain back, no fever, chills, no hematuria.   Her blood pressure has been controlled at home, today their BP is BP: 120/82  She does workout. She denies chest pain, shortness of breath, dizziness.  She is on cholesterol medication and denies myalgias. Her cholesterol is at goal. The cholesterol last visit was:   Lab Results  Component Value Date   CHOL 167 12/30/2016   HDL 56 12/30/2016   LDLCALC 76 12/30/2016   TRIG 176 (H) 12/30/2016   CHOLHDL 3.0 12/30/2016    She has been working on diet and exercise for prediabetes, and denies paresthesia of the feet, polydipsia, polyuria and visual disturbances. Last A1C in the office was:  Lab Results  Component Value Date   HGBA1C 5.0 12/30/2016   Patient is on Vitamin D supplement.   Lab Results  Component Value Date   VD25OH 99 12/30/2016     BMI is Body mass index is 24.54 kg/m., she is working on diet and exercise. Wt Readings from  Last 3 Encounters:  04/14/17 145 lb 3.2 oz (65.9 kg)  12/30/16 147 lb 6.4 oz (66.9 kg)  08/28/16 147 lb 12.8 oz (67 kg)    Current Medications:  Current Outpatient Prescriptions on File Prior to Visit  Medication Sig Dispense Refill  . albuterol (VENTOLIN HFA) 108 (90 Base) MCG/ACT inhaler USE 1 TO 2 INHALATIONS INTO THE LUNGS EVERY 6 HOURS AS NEEDED FOR WHEEZING OR SHORTNESS OF BREATH 18 Inhaler 3  . aspirin 81 MG chewable tablet Chew 81 mg by mouth daily.    . Calcium Carb-Cholecalciferol 1000-800 MG-UNIT TABS Take by mouth.    . cetirizine (ZYRTEC) 10 MG tablet Take 10 mg by mouth daily.    . Cholecalciferol (VITAMIN D) 2000 UNITS CAPS Take by mouth.    . cyanocobalamin 500 MCG tablet Take 500 mcg by mouth daily.    . diclofenac sodium (VOLTAREN) 1 % GEL Apply 2 g topically 4 (four) times daily. 100 g 3  . montelukast (SINGULAIR) 10 MG tablet take 1 tablet by mouth at bedtime 90 tablet 1  . Multiple Vitamins-Minerals (MULTIVITAMIN PO) Take by mouth daily.    . Omega-3 Fatty Acids (FISH OIL PO) Take by mouth daily.    Marland Kitchen omeprazole (PRILOSEC) 20 MG capsule take 1 capsule by mouth once daily 90 capsule 1  . pravastatin (PRAVACHOL) 40 MG tablet take 1 tablet by mouth once daily for  cholesterol 90 tablet 1  . Probiotic Product (PROBIOTIC DAILY PO) Take by mouth daily.    . verapamil (CALAN) 120 MG tablet take 1 tablet by mouth twice a day 180 tablet 1   No current facility-administered medications on file prior to visit.    Medical History:  Past Medical History:  Diagnosis Date  . Anemia   . Asthma   . GERD (gastroesophageal reflux disease)   . Goiter   . Hyperlipidemia   . Hypertension   . MVP (mitral valve prolapse)   . Osteopenia   . Other abnormal glucose    Allergies:  Allergies  Allergen Reactions  . Adhesive [Tape] Hives  . Codeine Hives  . Fosamax [Alendronate Sodium] Other (See Comments)    Reflux increase  . Penicillins Hives  . Shellfish Allergy   . Iodine  Rash     Review of Systems:  Review of Systems  Constitutional: Negative.   HENT: Negative.   Eyes: Negative.   Respiratory: Negative.   Cardiovascular: Negative.   Gastrointestinal: Negative.   Genitourinary: Negative.   Musculoskeletal: Negative.   Skin: Negative.   Neurological: Negative.   Endo/Heme/Allergies: Negative.   Psychiatric/Behavioral: Negative.     Family history- Review and unchanged Social history- Review and unchanged Physical Exam: BP 120/82   Pulse 74   Temp (!) 97.5 F (36.4 C)   Resp 16   Ht 5' 4.5" (1.638 m)   Wt 145 lb 3.2 oz (65.9 kg)   SpO2 97%   BMI 24.54 kg/m  Wt Readings from Last 3 Encounters:  04/14/17 145 lb 3.2 oz (65.9 kg)  12/30/16 147 lb 6.4 oz (66.9 kg)  08/28/16 147 lb 12.8 oz (67 kg)   General Appearance: Well nourished, in no apparent distress. Eyes: PERRLA, EOMs, conjunctiva no swelling or erythema Sinuses: No Frontal/maxillary tenderness ENT/Mouth: Ext aud canals clear, TMs without erythema, bulging. No erythema, swelling, or exudate on post pharynx.  Tonsils not swollen or erythematous. Hearing normal.  Neck: Supple, thyroid normal.  Respiratory: Respiratory effort normal, BS equal bilaterally without rales, rhonchi, wheezing or stridor.  Cardio: RRR with no MRGs. Brisk peripheral pulses without edema.  Abdomen: Soft, + BS,  Non tender, no guarding, rebound, hernias, masses. Lymphatics: Non tender without lymphadenopathy.  Musculoskeletal: Full ROM, 5/5 strength, Normal gait Skin: Warm, dry without rashes, lesions, ecchymosis.  Neuro: Cranial nerves intact. Normal muscle tone, no cerebellar symptoms. Psych: Awake and oriented X 3, normal affect, Insight and Judgment appropriate.    Vicie Mutters, PA-C 3:57 PM [facility]

## 2017-04-14 ENCOUNTER — Ambulatory Visit (INDEPENDENT_AMBULATORY_CARE_PROVIDER_SITE_OTHER): Payer: Medicare Other | Admitting: Physician Assistant

## 2017-04-14 ENCOUNTER — Encounter: Payer: Self-pay | Admitting: Physician Assistant

## 2017-04-14 VITALS — BP 120/82 | HR 74 | Temp 97.5°F | Resp 16 | Ht 64.5 in | Wt 145.2 lb

## 2017-04-14 DIAGNOSIS — I10 Essential (primary) hypertension: Secondary | ICD-10-CM | POA: Diagnosis not present

## 2017-04-14 DIAGNOSIS — Z79899 Other long term (current) drug therapy: Secondary | ICD-10-CM

## 2017-04-14 DIAGNOSIS — E785 Hyperlipidemia, unspecified: Secondary | ICD-10-CM | POA: Diagnosis not present

## 2017-04-14 DIAGNOSIS — R350 Frequency of micturition: Secondary | ICD-10-CM

## 2017-04-14 LAB — CBC WITH DIFFERENTIAL/PLATELET
BASOS PCT: 1 %
Basophils Absolute: 69 cells/uL (ref 0–200)
EOS ABS: 276 {cells}/uL (ref 15–500)
Eosinophils Relative: 4 %
HEMATOCRIT: 39.8 % (ref 35.0–45.0)
HEMOGLOBIN: 13.1 g/dL (ref 11.7–15.5)
LYMPHS ABS: 2070 {cells}/uL (ref 850–3900)
Lymphocytes Relative: 30 %
MCH: 30.7 pg (ref 27.0–33.0)
MCHC: 32.9 g/dL (ref 32.0–36.0)
MCV: 93.2 fL (ref 80.0–100.0)
MONO ABS: 690 {cells}/uL (ref 200–950)
MONOS PCT: 10 %
MPV: 8.7 fL (ref 7.5–12.5)
NEUTROS ABS: 3795 {cells}/uL (ref 1500–7800)
Neutrophils Relative %: 55 %
PLATELETS: 296 10*3/uL (ref 140–400)
RBC: 4.27 MIL/uL (ref 3.80–5.10)
RDW: 12.9 % (ref 11.0–15.0)
WBC: 6.9 10*3/uL (ref 3.8–10.8)

## 2017-04-14 NOTE — Patient Instructions (Signed)
Atrophic Vaginitis Atrophic vaginitis is a condition in which the tissues that line the vagina become dry and thin. This condition is most common in women who have stopped having regular menstrual periods (menopause). This usually starts when a woman is 45-75 years old. Estrogen helps to keep the vagina moist. It stimulates the vagina to produce a clear fluid that lubricates the vagina for sexual intercourse. This fluid also protects the vagina from infection. Lack of estrogen can cause the lining of the vagina to get thinner and dryer. The vagina may also shrink in size. It may become less elastic. Atrophic vaginitis tends to get worse over time as a woman's estrogen level drops. What are the causes? This condition is caused by the normal drop in estrogen that happens around the time of menopause. What increases the risk? Certain conditions or situations may lower a woman's estrogen level, which increases her risk of atrophic vaginitis. These include:  Taking medicine that blocks estrogen.  Having ovaries removed surgically.  Being treated for cancer with X-ray treatment (radiation) or medicines (chemotherapy).  Exercising very hard and often.  Having an eating disorder (anorexia).  Giving birth or breastfeeding.  Being over the age of 50.  Smoking.  What are the signs or symptoms? Symptoms of this condition include:  Pain, soreness, or bleeding during sexual intercourse (dyspareunia).  Vaginal burning, irritation, or itching.  Pain or bleeding during a vaginal examination using a speculum (pelvic exam).  Loss of interest in sexual activity.  Having burning pain when passing urine.  Vaginal discharge that is brown or yellow.  In some cases, there are no symptoms. How is this diagnosed? This condition is diagnosed with a medical history and physical exam. This will include a pelvic exam that checks whether the inside of your vagina appears pale, thin, or dry. Rarely, you may  also have other tests, including:  A urine test.  A test that checks the acid balance in your vaginal fluid (acid balance test).  How is this treated? Treatment for this condition may depend on the severity of your symptoms. Treatment may include:  Using an over-the-counter vaginal lubricant before you have sexual intercourse.  Using a long-acting vaginal moisturizer.  Using low-dose vaginal estrogen for moderate to severe symptoms that do not respond to other treatments. Options include creams, tablets, and inserts (vaginal rings). Before using vaginal estrogen, tell your health care provider if you have a history of: ? Breast cancer. ? Endometrial cancer. ? Blood clots.  Taking medicines. You may be able to take a daily pill for dyspareunia. Discuss all of the risks of this medicine with your health care provider. It is usually not recommended for women who have a family history or personal history of breast cancer.  If your symptoms are very mild and you are not sexually active, you may not need treatment. Follow these instructions at home:  Take medicines only as directed by your health care provider. Do not use herbal or alternative medicines unless your health care provider says that you can.  Use over-the-counter creams, lubricants, or moisturizers for dryness only as directed by your health care provider.  If your atrophic vaginitis is caused by menopause, discuss all of your menopausal symptoms and treatment options with your health care provider.  Do not douche.  Do not use products that can make your vagina dry. These include: ? Scented feminine sprays. ? Scented tampons. ? Scented soaps.  If it hurts to have sex, talk with your sexual   partner. Contact a health care provider if:  Your discharge looks different than normal.  Your vagina has an unusual smell.  You have new symptoms.  Your symptoms do not improve with treatment.  Your symptoms get worse. This  information is not intended to replace advice given to you by your health care provider. Make sure you discuss any questions you have with your health care provider. Document Released: 01/10/2015 Document Revised: 02/01/2016 Document Reviewed: 08/17/2014 [employer] Interactive Patient Education  2018 [employer]  

## 2017-04-15 LAB — HEPATIC FUNCTION PANEL
ALK PHOS: 73 U/L (ref 33–130)
ALT: 13 U/L (ref 6–29)
AST: 18 U/L (ref 10–35)
Albumin: 4 g/dL (ref 3.6–5.1)
BILIRUBIN INDIRECT: 0.2 mg/dL (ref 0.2–1.2)
Bilirubin, Direct: 0.1 mg/dL (ref <=0.2)
TOTAL PROTEIN: 6.2 g/dL (ref 6.1–8.1)
Total Bilirubin: 0.3 mg/dL (ref 0.2–1.2)

## 2017-04-15 LAB — URINALYSIS, ROUTINE W REFLEX MICROSCOPIC
BILIRUBIN URINE: NEGATIVE
GLUCOSE, UA: NEGATIVE
HGB URINE DIPSTICK: NEGATIVE
Ketones, ur: NEGATIVE
LEUKOCYTES UA: NEGATIVE
Nitrite: NEGATIVE
PROTEIN: NEGATIVE
Specific Gravity, Urine: 1.019 (ref 1.001–1.035)
pH: 5 (ref 5.0–8.0)

## 2017-04-15 LAB — BASIC METABOLIC PANEL WITH GFR
BUN: 21 mg/dL (ref 7–25)
CO2: 23 mmol/L (ref 20–32)
Calcium: 9.6 mg/dL (ref 8.6–10.4)
Chloride: 107 mmol/L (ref 98–110)
Creat: 0.61 mg/dL (ref 0.60–0.93)
GFR, Est Non African American: >89 mL/min (ref >=60)
Glucose, Bld: 88 mg/dL (ref 65–99)
POTASSIUM: 4.2 mmol/L (ref 3.5–5.3)
Sodium: 141 mmol/L (ref 135–146)

## 2017-04-15 LAB — LIPID PANEL
CHOL/HDL RATIO: 3.1 ratio (ref <5.0)
Cholesterol: 172 mg/dL (ref <200)
HDL: 55 mg/dL (ref >50)
LDL CALC: 95 mg/dL (ref <100)
Triglycerides: 110 mg/dL (ref <150)
VLDL: 22 mg/dL (ref <30)

## 2017-04-15 LAB — TSH: TSH: 0.24 mIU/L — ABNORMAL LOW

## 2017-04-15 LAB — MAGNESIUM: MAGNESIUM: 2.1 mg/dL (ref 1.5–2.5)

## 2017-04-15 LAB — URINE CULTURE: ORGANISM ID, BACTERIA: NO GROWTH

## 2017-05-14 ENCOUNTER — Other Ambulatory Visit: Payer: Self-pay | Admitting: Physician Assistant

## 2017-06-03 ENCOUNTER — Encounter: Payer: Self-pay | Admitting: Physician Assistant

## 2017-06-12 ENCOUNTER — Other Ambulatory Visit: Payer: Self-pay | Admitting: Internal Medicine

## 2017-06-12 DIAGNOSIS — Z1231 Encounter for screening mammogram for malignant neoplasm of breast: Secondary | ICD-10-CM

## 2017-06-27 ENCOUNTER — Ambulatory Visit
Admission: RE | Admit: 2017-06-27 | Discharge: 2017-06-27 | Disposition: A | Discharge status: Home / Self Care | Payer: Medicare Other | Source: Ambulatory Visit | Attending: Internal Medicine | Admitting: Internal Medicine

## 2017-06-27 DIAGNOSIS — Z1231 Encounter for screening mammogram for malignant neoplasm of breast: Secondary | ICD-10-CM | POA: Diagnosis not present

## 2017-06-29 DIAGNOSIS — Z23 Encounter for immunization: Secondary | ICD-10-CM | POA: Diagnosis not present

## 2017-06-30 DIAGNOSIS — Z1211 Encounter for screening for malignant neoplasm of colon: Secondary | ICD-10-CM | POA: Diagnosis not present

## 2017-06-30 DIAGNOSIS — Z0000 Encounter for general adult medical examination without abnormal findings: Secondary | ICD-10-CM | POA: Diagnosis not present

## 2017-06-30 LAB — FECAL OCCULT BLOOD, GUAIAC: Fecal Occult Blood: POSITIVE

## 2017-07-23 ENCOUNTER — Encounter: Payer: Self-pay | Admitting: Physician Assistant

## 2017-07-24 ENCOUNTER — Encounter: Payer: Self-pay | Admitting: Physician Assistant

## 2017-07-24 ENCOUNTER — Ambulatory Visit: Payer: Medicare Other | Admitting: Physician Assistant

## 2017-07-24 VITALS — BP 116/76 | HR 75 | Temp 97.7°F | Resp 14 | Ht 64.5 in | Wt 140.8 lb

## 2017-07-24 DIAGNOSIS — Z6823 Body mass index (BMI) 23.0-23.9, adult: Secondary | ICD-10-CM | POA: Diagnosis not present

## 2017-07-24 DIAGNOSIS — D649 Anemia, unspecified: Secondary | ICD-10-CM | POA: Diagnosis not present

## 2017-07-24 DIAGNOSIS — I341 Nonrheumatic mitral (valve) prolapse: Secondary | ICD-10-CM

## 2017-07-24 DIAGNOSIS — K219 Gastro-esophageal reflux disease without esophagitis: Secondary | ICD-10-CM

## 2017-07-24 DIAGNOSIS — R6889 Other general symptoms and signs: Secondary | ICD-10-CM | POA: Diagnosis not present

## 2017-07-24 DIAGNOSIS — R7303 Prediabetes: Secondary | ICD-10-CM

## 2017-07-24 DIAGNOSIS — Z79899 Other long term (current) drug therapy: Secondary | ICD-10-CM | POA: Diagnosis not present

## 2017-07-24 DIAGNOSIS — E559 Vitamin D deficiency, unspecified: Secondary | ICD-10-CM

## 2017-07-24 DIAGNOSIS — I10 Essential (primary) hypertension: Secondary | ICD-10-CM | POA: Diagnosis not present

## 2017-07-24 DIAGNOSIS — R195 Other fecal abnormalities: Secondary | ICD-10-CM

## 2017-07-24 DIAGNOSIS — E785 Hyperlipidemia, unspecified: Secondary | ICD-10-CM | POA: Diagnosis not present

## 2017-07-24 DIAGNOSIS — J45909 Unspecified asthma, uncomplicated: Secondary | ICD-10-CM | POA: Diagnosis not present

## 2017-07-24 DIAGNOSIS — Z0000 Encounter for general adult medical examination without abnormal findings: Secondary | ICD-10-CM

## 2017-07-24 DIAGNOSIS — Z0001 Encounter for general adult medical examination with abnormal findings: Secondary | ICD-10-CM | POA: Diagnosis not present

## 2017-07-24 NOTE — Patient Instructions (Signed)
Cologuard is an easy to use noninvasive colon cancer screening test based on the latest advances in stool DNA science.   Colon cancer is 3rd most diagnosed cancer and 2nd leading cause of death in both men and women 75 years of age and older despite being one of the most preventable and treatable cancers if found early.  4 of out 5 people diagnosed with colon cancer have NO prior family history.  When caught EARLY 90% of colon cancer is curable.   You have agreed to do a Cologuard screening and have declined a colonoscopy in spite of being explained the risks and benefits of the colonoscopy in detail, including cancer and death. Please understand that this is test not as sensitive or specific as a colonoscopy and you are still recommended to get a colonoscopy.   If you are NOT medicare please call your insurance company and give them these items to see if they will cover it: 1) CPT code, [phone] 2) Provider is Probation officer 3) [employer] NPI [id number] 4) [place] Tax ID [phone]  Out-of-pocket cost for Cologuard can range from $0 - $649 so please call  You will receive a short call from [place] support center at [employer], when you receive a call they will say they are from [place],  to confirm your mailing address and give you more information.  When they calll you, it will appear on the caller ID as "[employer]" or in some cases only this number will appear, [phone].   [employer] will ship your collection kit directly to you. You will collect a single stool sample in the privacy of your own home, no special preparation required. You will return the kit via [place] pre-paid shipping or pick-up, in the same box it arrived in. Then I will contact you to discuss your results after I receive them from the laboratory.   If you have any questions or concerns, Cologuard Customer Support Specialist are available 24 hours a  day, 7 days a week at [phone] or go to [email or web address].

## 2017-07-24 NOTE — Progress Notes (Signed)
Medicare wellness and OV  Assessment:   Essential hypertension - continue medications, DASH diet, exercise and monitor at home. Call if greater than 130/80.  -     CBC with Differential/Platelet -     BASIC METABOLIC PANEL WITH GFR -     Hepatic function panel -     TSH  Hyperlipidemia, unspecified hyperlipidemia type -continue medications, check lipids, decrease fatty foods, increase activity.  -     Lipid panel  Prediabetes Discussed disease progression and risks Discussed diet/exercise, weight management and risk modification  Medication management -     Magnesium  Anemia, unspecified type - monitor  Gastroesophageal reflux disease, esophagitis presence not specified Continue PPI/H2 blocker, diet discussed  Uncomplicated asthma, unspecified asthma severity, unspecified whether persistent Monitor, controlled  MVP (mitral valve prolapse) Monitor, no symptoms  Vitamin D deficiency Continue supplement  Encounter for Medicare annual wellness exam 1 year  BMI 23.0-23.9, adult Monitor  Heme positive stool Declines GI referral, not having any issues, will get cologuard, if positive send to GI If any new symptoms or anemia will refer to GI  Plan:   During the course of the visit the patient was educated and counseled about appropriate screening and preventive services including:    Pneumococcal vaccine   Influenza vaccine  Td vaccine  Screening electrocardiogram  Screening mammography  Bone densitometry screening  Colorectal cancer screening  Diabetes screening  Glaucoma screening  Nutrition counseling   Subjective:   Isabel Oliver is a 76 y.o. female who presents for Medicare Annual Wellness Visit and follow up for chronic illness including HTN, chol, preDM, vitamin D def, osteoporosis.    Her blood pressure has been controlled at home, today their BP is BP: 116/76 She does workout, still works at [employer] 20 hours a week and walks a lot. She  denies chest pain, shortness of breath, dizziness.  She had a + FIT test with insurance, she had normal colonoscopy 2011 and is not having issues at this time. She denies blood in stool, AB pain, fever, chills, GERD and would like to do cologuard rather than seeing GI. Will set that up.  She is on cholesterol medication and denies myalgias. Her cholesterol is not at goal. The cholesterol last visit was:   Lab Results  Component Value Date   CHOL 172 04/14/2017   HDL 55 04/14/2017   LDLCALC 95 04/14/2017   TRIG 110 04/14/2017   CHOLHDL 3.1 04/14/2017    Last A1C in the office was:  Lab Results  Component Value Date   HGBA1C 5.0 12/30/2016   Patient is on Vitamin D supplement.   Lab Results  Component Value Date   VD25OH 99 12/30/2016   She has history of osteoporosis, could not tolerate calcitonin or fosamax due to her GERD.  BMI is Body mass index is 23.8 kg/m., she is working on diet and exercise, she has been avoiding white bread/white pasta, and has been increasing salads.  Wt Readings from Last 3 Encounters:  07/24/17 140 lb 12.8 oz (63.9 kg)  04/14/17 145 lb 3.2 oz (65.9 kg)  12/30/16 147 lb 6.4 oz (66.9 kg)     Names of Other Physician/Practitioners you currently use: 1. [facility]- here for primary care 2. Dr. Katy Fitch, eye doctor, 05/2016 3. Dr Charline Bills. In [place], dentist, last visit q 6 months Patient Care Team: Unk Pinto, MD as PCP - General (Internal Medicine) Laurence Spates, MD as Consulting Physician (Gastroenterology) Gus Height,  MD as Consulting Physician (Obstetrics and Gynecology) Danella Sensing, MD as Consulting Physician (Dermatology)   Medication Review Current Outpatient Medications on File Prior to Visit  Medication Sig Dispense Refill  . albuterol (VENTOLIN HFA) 108 (90 Base) MCG/ACT inhaler USE 1 TO 2 INHALATIONS INTO THE LUNGS EVERY 6 HOURS AS NEEDED FOR WHEEZING OR SHORTNESS OF BREATH 18 Inhaler 3  .  aspirin 81 MG chewable tablet Chew 81 mg by mouth daily.    . Calcium Carb-Cholecalciferol 1000-800 MG-UNIT TABS Take by mouth.    . cetirizine (ZYRTEC) 10 MG tablet Take 10 mg by mouth daily.    . Cholecalciferol (VITAMIN D) 2000 UNITS CAPS Take by mouth.    . cyanocobalamin 500 MCG tablet Take 500 mcg by mouth daily.    . diclofenac sodium (VOLTAREN) 1 % GEL Apply 2 g topically 4 (four) times daily. 100 g 3  . montelukast (SINGULAIR) 10 MG tablet take 1 tablet by mouth at bedtime 90 tablet 1  . Multiple Vitamins-Minerals (MULTIVITAMIN PO) Take by mouth daily.    . Omega-3 Fatty Acids (FISH OIL PO) Take by mouth daily.    Marland Kitchen omeprazole (PRILOSEC) 20 MG capsule TAKE 1 CAPSULE BY MOUTH ONCE DAILY 90 capsule 0  . pravastatin (PRAVACHOL) 40 MG tablet take 1 tablet by mouth once daily for cholesterol 90 tablet 1  . Probiotic Product (PROBIOTIC DAILY PO) Take by mouth daily.    . verapamil (CALAN) 120 MG tablet take 1 tablet by mouth twice a day 180 tablet 1   No current facility-administered medications on file prior to visit.     Current Problems (verified) Patient Active Problem List   Diagnosis Date Noted  . Encounter for Medicare annual wellness exam 03/29/2015  . Medication management 12/05/2014  . Vitamin D deficiency 12/05/2014  . Prediabetes   . MVP (mitral valve prolapse)   . Hypertension   . Hyperlipidemia   . GERD (gastroesophageal reflux disease)   . Anemia   . Asthma     Screening Tests Immunization History  Administered Date(s) Administered  . Influenza, High Dose Seasonal PF 07/14/2014, 05/21/2016, 06/20/2017  . Influenza-Unspecified 06/28/2013, 07/06/2015  . Pneumococcal Conjugate-13 02/01/2014  . Pneumococcal-Unspecified 09/10/2007  . Tdap 12/05/2009  . Zoster 09/09/2005   Preventative care: Last colonoscopy: 2011  Had Positive hematest with insurance, declines GI refrral Last mammogram: 06/2017 CAT B Last pap smear/pelvic exam: 2011 remote DEXA: 07/16/2015+  will get next year osteoporsis- on D 3 and calcium  Prior vaccinations: TD or Tdap: 2011  Influenza: 2018  Pneumococcal: 2009 Prevnar 13: 2015 Shingles/Zostavax: 2007  Allergies Allergies  Allergen Reactions  . Adhesive [Tape] Hives  . Codeine Hives  . Fosamax [Alendronate Sodium] Other (See Comments)    Reflux increase  . Penicillins Hives  . Shellfish Allergy   . Iodine Rash    SURGICAL HISTORY She  has a past surgical history that includes Appendectomy; Cholecystectomy; Eye surgery; and Tonsillectomy and adenoidectomy. FAMILY HISTORY Her family history includes Diabetes in her father; Heart disease in her father; Hyperlipidemia in her brother and sister; Hypertension in her mother. SOCIAL HISTORY She  reports that  has never smoked. she has never used smokeless tobacco. She reports that she does not drink alcohol or use drugs.  MEDICARE WELLNESS OBJECTIVES: Physical activity: Current Exercise Habits: The patient has a physically strenous job, but has no regular exercise apart from work. Cardiac risk factors: Cardiac Risk Factors include: advanced age (>33men, >38 women);hypertension;dyslipidemia Depression/mood screen:   Depression  screen PHQ 2/9 07/24/2017  Decreased Interest 0  Down, Depressed, Hopeless 0  PHQ - 2 Score 0    ADLs:  In your present state of health, do you have any difficulty performing the following activities: 07/24/2017 12/30/2016  Hearing? N N  Vision? N N  Difficulty concentrating or making decisions? N N  Walking or climbing stairs? N N  Dressing or bathing? N N  Doing errands, shopping? N N  Some recent data might be hidden     Cognitive Testing  Alert? Yes  Normal Appearance?Yes  Oriented to person? Yes  Place? Yes   Time? Yes  Recall of three objects?  Yes  Can perform simple calculations? Yes  Displays appropriate judgment?Yes  Can read the correct time from a watch face?Yes  EOL planning: Does Patient Have a Medical Advance  Directive?: Yes Type of Advance Directive: Healthcare Power of Attorney, Living will [place] in Chart?: No - copy requested   Objective:     Blood pressure 116/76, pulse 75, temperature 97.7 F (36.5 C), resp. rate 14, height 5' 4.5" (1.638 m), weight 140 lb 12.8 oz (63.9 kg), SpO2 96 %. Body mass index is 23.8 kg/m.  General appearance: alert, no distress, WD/WN,  female HEENT: normocephalic, sclerae anicteric, TMs pearly, nares patent, no discharge or erythema, pharynx normal Oral cavity: MMM, no lesions Neck: supple, no lymphadenopathy, no thyromegaly, no masses Heart: RRR, normal S1, S2, no murmurs Lungs: CTA bilaterally, no wheezes, rhonchi, or rales Abdomen: +bs, soft, non tender, non distended, no masses, no hepatomegaly, no splenomegaly Musculoskeletal: nontender, no swelling, no obvious deformity Extremities: no edema, no cyanosis, no clubbing Pulses: 2+ symmetric, upper and lower extremities, normal cap refill Neurological: alert, oriented x 3, CN2-12 intact, strength normal upper extremities and lower extremities, sensation normal throughout, DTRs 2+ throughout, no cerebellar signs, gait normal Psychiatric: normal affect, behavior normal, pleasant  Breast: defer Gyn: defer Rectal: defer  Medicare Attestation I have personally reviewed: The patient's medical and social history Their use of alcohol, tobacco or illicit drugs Their current medications and supplements The patient's functional ability including ADLs,fall risks, home safety risks, cognitive, and hearing and visual impairment Diet and physical activities Evidence for depression or mood disorders  The patient's weight, height, BMI, and visual acuity have been recorded in the chart.  I have made referrals, counseling, and provided education to the patient based on review of the above and I have provided the patient with a written personalized care plan for preventive services.      Vicie Mutters, PA-C   07/24/2017

## 2017-07-25 LAB — CBC WITH DIFFERENTIAL/PLATELET
BASOS ABS: 43 {cells}/uL (ref 0–200)
Basophils Relative: 0.6 %
EOS ABS: 187 {cells}/uL (ref 15–500)
Eosinophils Relative: 2.6 %
HEMATOCRIT: 37.9 % (ref 35.0–45.0)
HEMOGLOBIN: 12.6 g/dL (ref 11.7–15.5)
LYMPHS ABS: 1966 {cells}/uL (ref 850–3900)
MCH: 29.4 pg (ref 27.0–33.0)
MCHC: 33.2 g/dL (ref 32.0–36.0)
MCV: 88.6 fL (ref 80.0–100.0)
MPV: 9.2 fL (ref 7.5–12.5)
Monocytes Relative: 8.9 %
NEUTROS ABS: 4363 {cells}/uL (ref 1500–7800)
NEUTROS PCT: 60.6 %
PLATELETS: 254 10*3/uL (ref 140–400)
RBC: 4.28 10*6/uL (ref 3.80–5.10)
RDW: 12.3 % (ref 11.0–15.0)
Total Lymphocyte: 27.3 %
WBC: 7.2 10*3/uL (ref 3.8–10.8)
WBCMIX: 641 {cells}/uL (ref 200–950)

## 2017-07-25 LAB — BASIC METABOLIC PANEL WITH GFR
BUN: 20 mg/dL (ref 7–25)
CALCIUM: 10 mg/dL (ref 8.6–10.4)
CHLORIDE: 104 mmol/L (ref 98–110)
CO2: 28 mmol/L (ref 20–32)
CREATININE: 0.77 mg/dL (ref 0.60–0.93)
GFR, Est African American: 88 mL/min/{1.73_m2} (ref >=60)
GFR, Est Non African American: 76 mL/min/{1.73_m2} (ref >=60)
GLUCOSE: 89 mg/dL (ref 65–99)
Potassium: 4.5 mmol/L (ref 3.5–5.3)
Sodium: 140 mmol/L (ref 135–146)

## 2017-07-25 LAB — VITAMIN B12: Vitamin B-12: 1296 pg/mL — ABNORMAL HIGH (ref 200–1100)

## 2017-07-25 LAB — HEPATIC FUNCTION PANEL
AG RATIO: 1.8 (calc) (ref 1.0–2.5)
ALBUMIN MSPROF: 4.3 g/dL (ref 3.6–5.1)
ALT: 11 U/L (ref 6–29)
AST: 19 U/L (ref 10–35)
Alkaline phosphatase (APISO): 75 U/L (ref 33–130)
Bilirubin, Direct: 0.1 mg/dL (ref 0.0–0.2)
GLOBULIN: 2.4 g/dL (ref 1.9–3.7)
Indirect Bilirubin: 0.3 mg/dL (calc) (ref 0.2–1.2)
TOTAL PROTEIN: 6.7 g/dL (ref 6.1–8.1)
Total Bilirubin: 0.4 mg/dL (ref 0.2–1.2)

## 2017-07-25 LAB — LIPID PANEL
CHOLESTEROL: 183 mg/dL (ref <200)
HDL: 67 mg/dL (ref >50)
LDL CHOLESTEROL (CALC): 97 mg/dL
Non-HDL Cholesterol (Calc): 116 mg/dL (calc) (ref <130)
TRIGLYCERIDES: 99 mg/dL (ref <150)
Total CHOL/HDL Ratio: 2.7 (calc) (ref <5.0)

## 2017-07-25 LAB — IRON, TOTAL/TOTAL IRON BINDING CAP
%SAT: 17 % (calc) (ref 11–50)
IRON: 54 ug/dL (ref 45–160)
TIBC: 314 mcg/dL (calc) (ref 250–450)

## 2017-07-25 LAB — MAGNESIUM: Magnesium: 2.3 mg/dL (ref 1.5–2.5)

## 2017-07-25 LAB — TSH: TSH: 0.3 m[IU]/L — AB (ref 0.40–4.50)

## 2017-07-25 NOTE — Progress Notes (Signed)
Pt aware of lab results & voiced understanding of those results.

## 2017-08-04 DIAGNOSIS — Z1212 Encounter for screening for malignant neoplasm of rectum: Secondary | ICD-10-CM | POA: Diagnosis not present

## 2017-08-04 DIAGNOSIS — Z1211 Encounter for screening for malignant neoplasm of colon: Secondary | ICD-10-CM | POA: Diagnosis not present

## 2017-08-05 LAB — COLOGUARD: Cologuard: POSITIVE

## 2017-08-11 ENCOUNTER — Telehealth: Payer: Self-pay | Admitting: Internal Medicine

## 2017-08-11 ENCOUNTER — Other Ambulatory Visit: Payer: Self-pay | Admitting: Physician Assistant

## 2017-08-11 DIAGNOSIS — R195 Other fecal abnormalities: Secondary | ICD-10-CM

## 2017-08-11 HISTORY — DX: Other fecal abnormalities: R19.5

## 2017-08-11 NOTE — Telephone Encounter (Signed)
Advised patient of positive Cologuard lab result. Per Vicie Mutters, referring to GI, Dr Laurence Spates for colonoscopy. Patient understands and agrees. Faxed referral to Dr Oletta Lamas

## 2017-08-31 ENCOUNTER — Other Ambulatory Visit: Payer: Self-pay | Admitting: Internal Medicine

## 2017-09-16 ENCOUNTER — Other Ambulatory Visit: Payer: Self-pay | Admitting: Internal Medicine

## 2017-09-18 ENCOUNTER — Other Ambulatory Visit: Payer: Self-pay | Admitting: Internal Medicine

## 2017-09-18 DIAGNOSIS — M1990 Unspecified osteoarthritis, unspecified site: Secondary | ICD-10-CM

## 2017-10-16 ENCOUNTER — Other Ambulatory Visit: Payer: Self-pay | Admitting: Physician Assistant

## 2017-11-12 ENCOUNTER — Ambulatory Visit: Payer: Medicare Other | Admitting: Internal Medicine

## 2017-11-12 ENCOUNTER — Encounter: Payer: Self-pay | Admitting: Internal Medicine

## 2017-11-12 VITALS — BP 150/78 | HR 88 | Temp 97.5°F | Resp 16 | Ht 64.5 in | Wt 141.2 lb

## 2017-11-12 DIAGNOSIS — R7303 Prediabetes: Secondary | ICD-10-CM | POA: Diagnosis not present

## 2017-11-12 DIAGNOSIS — E782 Mixed hyperlipidemia: Secondary | ICD-10-CM | POA: Diagnosis not present

## 2017-11-12 DIAGNOSIS — Z79899 Other long term (current) drug therapy: Secondary | ICD-10-CM | POA: Diagnosis not present

## 2017-11-12 DIAGNOSIS — I10 Essential (primary) hypertension: Secondary | ICD-10-CM

## 2017-11-12 DIAGNOSIS — R7309 Other abnormal glucose: Secondary | ICD-10-CM | POA: Diagnosis not present

## 2017-11-12 DIAGNOSIS — K219 Gastro-esophageal reflux disease without esophagitis: Secondary | ICD-10-CM | POA: Diagnosis not present

## 2017-11-12 DIAGNOSIS — E559 Vitamin D deficiency, unspecified: Secondary | ICD-10-CM

## 2017-11-12 NOTE — Patient Instructions (Signed)

## 2017-11-12 NOTE — Progress Notes (Signed)
This very nice 77 y.o. MWF  presents for 3 month follow up with HTN, HLD, Pre-Diabetes and Vitamin D Deficiency. Patient has GERD controlled on current meds. Patient apparently had f/u Colon scheduled last Aug w/ Dr Oletta Lamas and cancelled due to conflicts in caring for her invalid husband.      Patient is treated for HTN (1995) & BP has been controlled at home. Today's BP was initially elevated at 150 /78 and was rechecked at Sitting BP 141/88 -  P 87 and Standing BP 141/90  -  P 85. Patient has had no complaints of any cardiac type chest pain, palpitations, dyspnea / orthopnea / PND, dizziness, claudication, or dependent edema.     Hyperlipidemia is controlled with diet & meds. Patient denies myalgias or other med SE's. Last Lipids were at goal: Lab Results  Component Value Date   CHOL 183 07/24/2017   HDL 67 07/24/2017   LDLCALC 95 04/14/2017   TRIG 99 07/24/2017   CHOLHDL 2.7 07/24/2017      Also, the patient has history of PreDiabetes (A1c 6.0%/2014 and A1c 5.1% /Aug/2016) )  and has had no symptoms of reactive hypoglycemia, diabetic polys, paresthesias or visual blurring.  Last A1c was Normal & at goal: Lab Results  Component Value Date   HGBA1C 5.0 12/30/2016      Further, the patient also has history of Vitamin D Deficiency and supplements vitamin D without any suspected side-effects. Last Vitamin D was at goal: Lab Results  Component Value Date   VD25OH 99 12/30/2016   Current Outpatient Medications on File Prior to Visit  Medication Sig  . albuterol (VENTOLIN HFA) 108 (90 Base) MCG/ACT inhaler USE 1 TO 2 INHALATIONS INTO THE LUNGS EVERY 6 HOURS AS NEEDED FOR WHEEZING OR SHORTNESS OF BREATH  . aspirin 81 MG chewable tablet Chew 81 mg by mouth daily.  . Calcium Carb-Cholecalciferol 1000-800 MG-UNIT TABS Take by mouth.  . cetirizine (ZYRTEC) 10 MG tablet Take 10 mg by mouth daily.  . Cholecalciferol (VITAMIN D) 2000 UNITS CAPS Take by mouth.  . cyanocobalamin 500 MCG tablet  Take 500 mcg by mouth daily.  . diclofenac sodium (VOLTAREN) 1 % GEL APPLY 2 GRAMS TO AFFECTED AREA FOUR TIMES DAILY  . montelukast (SINGULAIR) 10 MG tablet TAKE 1 TABLET BY MOUTH AT BEDTIME  . Multiple Vitamins-Minerals (MULTIVITAMIN PO) Take by mouth daily.  . Omega-3 Fatty Acids (FISH OIL PO) Take by mouth daily.  Marland Kitchen omeprazole (PRILOSEC) 20 MG capsule TAKE 1 CAPSULE BY MOUTH EVERY DAY  . pravastatin (PRAVACHOL) 40 MG tablet TAKE 1 TABLET BY MOUTH ONCE DAILY FOR CHOLESTEROL AS DIRECTED  . Probiotic Product (PROBIOTIC DAILY PO) Take by mouth daily.  . verapamil (CALAN) 120 MG tablet take 1 tablet by mouth twice a day   No current facility-administered medications on file prior to visit.    Allergies  Allergen Reactions  . Adhesive [Tape] Hives  . Codeine Hives  . Fosamax [Alendronate Sodium] Other (See Comments)    Reflux increase  . Penicillins Hives  . Shellfish Allergy   . Iodine Rash   PMHx:   Past Medical History:  Diagnosis Date  . Anemia   . Asthma   . GERD (gastroesophageal reflux disease)   . Goiter   . Hyperlipidemia   . Hypertension   . MVP (mitral valve prolapse)   . Osteopenia   . Other abnormal glucose    Immunization History  Administered Date(s) Administered  .  Influenza, High Dose Seasonal PF 07/14/2014, 05/21/2016, 06/20/2017  . Influenza-Unspecified 06/28/2013, 07/06/2015  . Pneumococcal Conjugate-13 02/01/2014  . Pneumococcal-Unspecified 09/10/2007  . Tdap 12/05/2009  . Zoster 09/09/2005   Past Surgical History:  Procedure Laterality Date  . APPENDECTOMY    . CHOLECYSTECTOMY    . EYE SURGERY    . TONSILLECTOMY AND ADENOIDECTOMY     FHx:    Reviewed / unchanged  SHx:    Reviewed / unchanged  Systems Review:  Constitutional: Denies fever, chills, wt changes, headaches, insomnia, fatigue, night sweats, change in appetite. Eyes: Denies redness, blurred vision, diplopia, discharge, itchy, watery eyes.  ENT: Denies discharge, congestion, post  nasal drip, epistaxis, sore throat, earache, hearing loss, dental pain, tinnitus, vertigo, sinus pain, snoring.  CV: Denies chest pain, palpitations, irregular heartbeat, syncope, dyspnea, diaphoresis, orthopnea, PND, claudication or edema. Respiratory: denies cough, dyspnea, DOE, pleurisy, hoarseness, laryngitis, wheezing.  Gastrointestinal: Denies dysphagia, odynophagia, heartburn, reflux, water brash, abdominal pain or cramps, nausea, vomiting, bloating, diarrhea, constipation, hematemesis, melena, hematochezia  or hemorrhoids. Genitourinary: Denies dysuria, frequency, urgency, nocturia, hesitancy, discharge, hematuria or flank pain. Musculoskeletal: Denies arthralgias, myalgias, stiffness, jt. swelling, pain, limping or strain/sprain.  Skin: Denies pruritus, rash, hives, warts, acne, eczema or change in skin lesion(s). Neuro: No weakness, tremor, incoordination, spasms, paresthesia or pain. Psychiatric: Denies confusion, memory loss or sensory loss. Endo: Denies change in weight, skin or hair change.  Heme/Lymph: No excessive bleeding, bruising or enlarged lymph nodes.  Physical Exam  BP (!) 150/78   Pulse 88   Temp (!) 97.5 F (36.4 C)   Resp 16   Ht 5' 4.5" (1.638 m)   Wt 141 lb 3.2 oz (64 kg)   BMI 23.86 kg/m   Appears  well nourished, well groomed  and in no distress.  Eyes: PERRLA, EOMs, conjunctiva no swelling or erythema. Sinuses: No frontal/maxillary tenderness ENT/Mouth: EAC's clear, TM's nl w/o erythema, bulging. Nares clear w/o erythema, swelling, exudates. Oropharynx clear without erythema or exudates. Oral hygiene is good. Tongue normal, non obstructing. Hearing intact.  Neck: Supple. Thyroid not palpable. Car 2+/2+ without bruits, nodes or JVD. Chest: Respirations nl with BS clear & equal w/o rales, rhonchi, wheezing or stridor.  Cor: Heart sounds normal w/ regular rate and rhythm without sig. murmurs, gallops, clicks or rubs. Peripheral pulses normal and equal   without edema.  Abdomen: Soft & bowel sounds normal. Non-tender w/o guarding, rebound, hernias, masses or organomegaly.  Lymphatics: Unremarkable.  Musculoskeletal: Full ROM all peripheral extremities, joint stability, 5/5 strength and normal gait.  Skin: Warm, dry without exposed rashes, lesions or ecchymosis apparent.  Neuro: Cranial nerves intact, reflexes equal bilaterally. Sensory-motor testing grossly intact. Tendon reflexes grossly intact.  Pysch: Alert & oriented x 3.  Insight and judgement nl & appropriate. No ideations.  Assessment and Plan:  1. Essential hypertension  - Continue medication, monitor blood pressure at home.  - Continue DASH diet. Reminder to go to the ER if any CP,  SOB, nausea, dizziness, severe HA, changes vision/speech.  - CBC with Differential/Platelet - BASIC METABOLIC PANEL WITH GFR - Magnesium - TSH  2. Hyperlipidemia, mixed  - Continue diet/meds, exercise,& lifestyle modifications.  - Continue monitor periodic cholesterol/liver & renal functions   - Hepatic function panel - Lipid panel - TSH  3. Abnormal glucose  - Continue diet, exercise, lifestyle modifications.  - Monitor appropriate labs.  - Hemoglobin A1c - Insulin, random  4. Vitamin D deficiency  - Continue supplementation.   - VITAMIN  D 25 Hydroxyl  5. Prediabetes  - Hemoglobin A1c - Insulin, random  6. Gastroesophageal reflux disease  - CBC with Differential/Platelet  7. Medication management  - CBC with Differential/Platelet - BASIC METABOLIC PANEL WITH GFR - Hepatic function panel - Magnesium - Lipid panel - TSH - Hemoglobin A1c - Insulin, random - VITAMIN D 25 Hydroxyl         Discussed  regular exercise, BP monitoring, weight control to achieve/maintain BMI less than 25 and discussed med and SE's. Recommended labs to assess and monitor clinical status with further disposition pending results of labs. Over 30 minutes of exam, counseling, chart review was  performed.

## 2017-11-13 LAB — CBC WITH DIFFERENTIAL/PLATELET
BASOS PCT: 0.6 %
Basophils Absolute: 37 cells/uL (ref 0–200)
EOS ABS: 161 {cells}/uL (ref 15–500)
Eosinophils Relative: 2.6 %
HEMATOCRIT: 38 % (ref 35.0–45.0)
Hemoglobin: 12.9 g/dL (ref 11.7–15.5)
LYMPHS ABS: 1686 {cells}/uL (ref 850–3900)
MCH: 29.9 pg (ref 27.0–33.0)
MCHC: 33.9 g/dL (ref 32.0–36.0)
MCV: 88.2 fL (ref 80.0–100.0)
MPV: 9.4 fL (ref 7.5–12.5)
Monocytes Relative: 9.1 %
NEUTROS PCT: 60.5 %
Neutro Abs: 3751 cells/uL (ref 1500–7800)
Platelets: 303 10*3/uL (ref 140–400)
RBC: 4.31 10*6/uL (ref 3.80–5.10)
RDW: 12.1 % (ref 11.0–15.0)
Total Lymphocyte: 27.2 %
WBC: 6.2 10*3/uL (ref 3.8–10.8)
WBCMIX: 564 {cells}/uL (ref 200–950)

## 2017-11-13 LAB — HEPATIC FUNCTION PANEL
AG RATIO: 1.9 (calc) (ref 1.0–2.5)
ALKALINE PHOSPHATASE (APISO): 80 U/L (ref 33–130)
ALT: 11 U/L (ref 6–29)
AST: 17 U/L (ref 10–35)
Albumin: 4.3 g/dL (ref 3.6–5.1)
BILIRUBIN INDIRECT: 0.2 mg/dL (ref 0.2–1.2)
BILIRUBIN TOTAL: 0.3 mg/dL (ref 0.2–1.2)
Bilirubin, Direct: 0.1 mg/dL (ref 0.0–0.2)
GLOBULIN: 2.3 g/dL (ref 1.9–3.7)
TOTAL PROTEIN: 6.6 g/dL (ref 6.1–8.1)

## 2017-11-13 LAB — BASIC METABOLIC PANEL WITH GFR
BUN: 18 mg/dL (ref 7–25)
CO2: 27 mmol/L (ref 20–32)
Calcium: 10.1 mg/dL (ref 8.6–10.4)
Chloride: 104 mmol/L (ref 98–110)
Creat: 0.74 mg/dL (ref 0.60–0.93)
GFR, EST NON AFRICAN AMERICAN: 79 mL/min/{1.73_m2} (ref >=60)
GFR, Est African American: 92 mL/min/{1.73_m2} (ref >=60)
Glucose, Bld: 118 mg/dL — ABNORMAL HIGH (ref 65–99)
Potassium: 4.4 mmol/L (ref 3.5–5.3)
SODIUM: 139 mmol/L (ref 135–146)

## 2017-11-13 LAB — LIPID PANEL
CHOLESTEROL: 179 mg/dL (ref <200)
HDL: 60 mg/dL (ref >50)
LDL CHOLESTEROL (CALC): 101 mg/dL — AB
NON-HDL CHOLESTEROL (CALC): 119 mg/dL (ref <130)
Total CHOL/HDL Ratio: 3 (calc) (ref <5.0)
Triglycerides: 88 mg/dL (ref <150)

## 2017-11-13 LAB — TSH: TSH: 0.4 mIU/L (ref 0.40–4.50)

## 2017-11-13 LAB — INSULIN, RANDOM: Insulin: 18.3 u[IU]/mL (ref 2.0–19.6)

## 2017-11-13 LAB — MAGNESIUM: Magnesium: 2.2 mg/dL (ref 1.5–2.5)

## 2017-11-13 LAB — HEMOGLOBIN A1C
Hgb A1c MFr Bld: 5.4 % of total Hgb (ref <5.7)
Mean Plasma Glucose: 108 (calc)
eAG (mmol/L): 6 (calc)

## 2017-11-13 LAB — VITAMIN D 25 HYDROXY (VIT D DEFICIENCY, FRACTURES): VIT D 25 HYDROXY: 125 ng/mL — AB (ref 30–100)

## 2017-11-29 ENCOUNTER — Other Ambulatory Visit: Payer: Self-pay | Admitting: Adult Health

## 2017-12-30 ENCOUNTER — Other Ambulatory Visit: Payer: Self-pay | Admitting: Adult Health

## 2017-12-30 ENCOUNTER — Other Ambulatory Visit: Payer: Self-pay | Admitting: Internal Medicine

## 2018-02-17 NOTE — Progress Notes (Signed)
FOLLOW UP  Assessment and Plan:   Hypertension Well controlled with current medications  Monitor blood pressure at home; patient to call if consistently greater than 130/80 Continue DASH diet.   Reminder to go to the ER if any CP, SOB, nausea, dizziness, severe HA, changes vision/speech, left arm numbness and tingling and jaw pain.  Cholesterol Currently at goal; continue statin Continue low cholesterol diet and exercise.  Check lipid panel.   Other abnormal glucose Recent A1Cs at goal Discussed diet/exercise, weight management  Defer A1C; check BMP  BMI 23  Continue to recommend diet heavy in fruits and veggies and low in animal meats, cheeses, and dairy products, appropriate calorie intake Discuss exercise recommendations routinely Continue to monitor weight at each visit  Vitamin D Def Above goal at last visit; continue supplementation to maintain goal of 70-100 Check Vit D level  Continue diet and meds as discussed. Further disposition pending results of labs. Discussed med's effects and SE's.   Over 30 minutes of exam, counseling, chart review, and critical decision making was performed.   Future Appointments  Date Time Provider [place]  08/11/2018  3:00 PM Vicie Mutters, PA-C [facility] None    ----------------------------------------------------------------------------------------------------------------------  HPI 77 y.o. female  presents for 3 month follow up on hypertension, cholesterol, glucose management, weight and vitamin D deficiency. She recently lost her husband and is recovering from change in lifestyle. Reports is doing well, calling family daily, sleeping ok at night.   BMI is Body mass index is 23.8 kg/m., she has been working on diet and exercise (works at [employer], walks a lot daily).  Wt Readings from Last 3 Encounters:  02/18/18 140 lb 12.8 oz (63.9 kg)  11/12/17 141 lb 3.2 oz (64 kg)  07/24/17 140 lb 12.8 oz (63.9 kg)   Her  blood pressure has been controlled at home, today their BP is BP: 128/60  She does workout. She denies chest pain, shortness of breath, dizziness.   She is on cholesterol medication (pravastatin 40 mg daily) and denies myalgias. Her cholesterol is at goal. The cholesterol last visit was:   Lab Results  Component Value Date   CHOL 179 11/12/2017   HDL 60 11/12/2017   LDLCALC 101 (H) 11/12/2017   TRIG 88 11/12/2017   CHOLHDL 3.0 11/12/2017    She has been working on diet and exercise for glucose management, and denies foot ulcerations, increased appetite, nausea, paresthesia of the feet, polydipsia, polyuria, visual disturbances, vomiting and weight loss. Last A1C in the office was:  Lab Results  Component Value Date   HGBA1C 5.4 11/12/2017   Patient is on Vitamin D supplement.   Lab Results  Component Value Date   VD25OH 125 (H) 11/12/2017        Current Medications:  Current Outpatient Medications on File Prior to Visit  Medication Sig  . albuterol (VENTOLIN HFA) 108 (90 Base) MCG/ACT inhaler USE 1 TO 2 INHALATIONS INTO THE LUNGS EVERY 6 HOURS AS NEEDED FOR WHEEZING OR SHORTNESS OF BREATH  . Calcium Carb-Cholecalciferol 1000-800 MG-UNIT TABS Take by mouth.  . cetirizine (ZYRTEC) 10 MG tablet Take 10 mg by mouth daily.  . Cholecalciferol (VITAMIN D) 2000 UNITS CAPS Take by mouth.  . cyanocobalamin 500 MCG tablet Take 500 mcg by mouth daily.  . diclofenac sodium (VOLTAREN) 1 % GEL APPLY 2 GRAMS TO AFFECTED AREA FOUR TIMES DAILY  . montelukast (SINGULAIR) 10 MG tablet TAKE 1 TABLET BY MOUTH AT BEDTIME  . Multiple Vitamins-Minerals (MULTIVITAMIN  PO) Take by mouth daily.  . Omega-3 Fatty Acids (FISH OIL PO) Take by mouth daily.  Marland Kitchen omeprazole (PRILOSEC) 20 MG capsule TAKE 1 CAPSULE BY MOUTH EVERY DAY  . pravastatin (PRAVACHOL) 40 MG tablet TAKE 1 TABLET BY MOUTH EVERY DAY AS DIRECTED FOR CHOLESTEROL  . Probiotic Product (PROBIOTIC DAILY PO) Take by mouth daily.  . verapamil (CALAN)  120 MG tablet take 1 tablet by mouth twice a day (Patient taking differently: take 1 tablet by mouth once a day)  . aspirin 81 MG chewable tablet Chew 81 mg by mouth daily.  . pravastatin (PRAVACHOL) 40 MG tablet TAKE 1 TABLET BY MOUTH EVERY DAY AS DIRECTED FOR CHOLESTEROL   No current facility-administered medications on file prior to visit.      Allergies:  Allergies  Allergen Reactions  . Adhesive [Tape] Hives  . Codeine Hives  . Fosamax [Alendronate Sodium] Other (See Comments)    Reflux increase  . Penicillins Hives  . Shellfish Allergy   . Iodine Rash     Medical History:  Past Medical History:  Diagnosis Date  . Anemia   . Asthma   . GERD (gastroesophageal reflux disease)   . Goiter   . Hyperlipidemia   . Hypertension   . MVP (mitral valve prolapse)   . Osteopenia   . Other abnormal glucose    Family history- Reviewed and unchanged Social history- Reviewed and unchanged   Review of Systems:  Review of Systems  Constitutional: Negative for malaise/fatigue and weight loss.  HENT: Negative for hearing loss and tinnitus.   Eyes: Negative for blurred vision and double vision.  Respiratory: Negative for cough, shortness of breath and wheezing.   Cardiovascular: Negative for chest pain, palpitations, orthopnea, claudication and leg swelling.  Gastrointestinal: Negative for abdominal pain, blood in stool, constipation, diarrhea, heartburn, melena, nausea and vomiting.  Genitourinary: Negative.   Musculoskeletal: Negative for joint pain and myalgias.  Skin: Negative for rash.  Neurological: Negative for dizziness, tingling, sensory change, weakness and headaches.  Endo/Heme/Allergies: Negative for polydipsia.  Psychiatric/Behavioral: Negative.   All other systems reviewed and are negative.     Physical Exam: BP 128/60   Pulse 84   Temp 97.7 F (36.5 C)   Ht 5' 4.5" (1.638 m)   Wt 140 lb 12.8 oz (63.9 kg)   SpO2 96%   BMI 23.80 kg/m  Wt Readings from  Last 3 Encounters:  02/18/18 140 lb 12.8 oz (63.9 kg)  11/12/17 141 lb 3.2 oz (64 kg)  07/24/17 140 lb 12.8 oz (63.9 kg)   General Appearance: Well nourished, in no apparent distress. Eyes: PERRLA, EOMs, conjunctiva no swelling or erythema Sinuses: No Frontal/maxillary tenderness ENT/Mouth: Ext aud canals clear, TMs without erythema, bulging. No erythema, swelling, or exudate on post pharynx.  Tonsils not swollen or erythematous. Hearing normal.  Neck: Supple, thyroid normal.  Respiratory: Respiratory effort normal, BS equal bilaterally without rales, rhonchi, wheezing or stridor.  Cardio: RRR with no MRGs. Brisk peripheral pulses without edema.  Abdomen: Soft, + BS.  Non tender, no guarding, rebound, hernias, masses. Lymphatics: Non tender without lymphadenopathy.  Musculoskeletal: Full ROM, 5/5 strength, Normal gait Skin: Warm, dry without rashes, lesions, ecchymosis.  Neuro: Cranial nerves intact. No cerebellar symptoms.  Psych: Awake and oriented X 3, normal affect, Insight and Judgment appropriate.    Izora Ribas, NP 3:32 PM [facility]

## 2018-02-18 ENCOUNTER — Ambulatory Visit: Payer: Medicare Other | Admitting: Adult Health

## 2018-02-18 ENCOUNTER — Encounter: Payer: Self-pay | Admitting: Adult Health

## 2018-02-18 VITALS — BP 128/60 | HR 84 | Temp 97.7°F | Ht 64.5 in | Wt 140.8 lb

## 2018-02-18 DIAGNOSIS — Z79899 Other long term (current) drug therapy: Secondary | ICD-10-CM

## 2018-02-18 DIAGNOSIS — I10 Essential (primary) hypertension: Secondary | ICD-10-CM

## 2018-02-18 DIAGNOSIS — Z6823 Body mass index (BMI) 23.0-23.9, adult: Secondary | ICD-10-CM

## 2018-02-18 DIAGNOSIS — E559 Vitamin D deficiency, unspecified: Secondary | ICD-10-CM

## 2018-02-18 DIAGNOSIS — R7309 Other abnormal glucose: Secondary | ICD-10-CM

## 2018-02-18 DIAGNOSIS — E785 Hyperlipidemia, unspecified: Secondary | ICD-10-CM

## 2018-02-18 NOTE — Patient Instructions (Signed)
Aim for 7+ servings of fruits and vegetables daily  80+ fluid ounces of water or unsweet tea for healthy kidneys  Limit alcohol intake  Limit animal fats in diet for cholesterol and heart health - choose grass fed whenever available  Aim for low stress - take time to unwind and care for your mental health  Aim for 150 min of moderate intensity exercise weekly for heart health, and weights twice weekly for bone health  Aim for 7-9 hours of sleep daily    Coping With Loss, Adult People experience loss in many different ways throughout their lives. Events such as moving, changing jobs, and losing friends can create a sense of loss. The loss may be as serious as a major health change, divorce, death of a pet, or death of a loved one. All of these types of loss are likely to create a physical and emotional reaction known as grief. Grief is the result of a major change or an absence of something or someone that you count on. Grief is a normal reaction to loss. How to recognize changes A variety of factors can affect your grieving experience, including:  The nature of your loss.  Your relationship to what or whom you lost.  Your understanding of grief and how to cope with it.  Your support system.  The way that you deal with your grief will affect your ability to function as you normally do. When you are grieving, you may experience:  Numbness, shock, sadness, anxiety, anger, denial, and guilt.  Thoughts about death.  Unexpected crying.  A physical sensation of emptiness in your gut.  Problems sleeping and eating.  Fatigue.  Loss of interest in normal activities.  Dreaming about or imagining seeing the person who died.  A need to remember what or whom you lost.  Difficulty thinking about anything other than your loss for a period of time.  Relief. If you have been expecting the loss for a while, you may feel a sense of relief when it happens.  Where to find support To  get support for coping with loss:  Ask your health care provider for help and recommendations, such as grief counseling or therapy.  Think about joining a support group for people who are coping with loss.  Follow these instructions at home:  Be patient with yourself and others. Allow the grieving process to happen, and remember that grieving takes time. ? It is likely that you may never feel completely done with some grief. You may find a way to move on while still cherishing memories and feelings about your loss. ? Accepting your loss is a process. It can take months or longer to adjust.  Express your feelings in healthy ways, such as: ? Talking with others about your loss. It may be helpful to find others who have had a similar loss, such as a support group. ? Writing down your feelings in a journal. ? Doing physical activities to release stress and emotional energy. ? Doing creative activities like painting, sculpting, or playing or listening to music. ? Practicing resilience. This is the ability to recover and adjust after facing challenges. Reading some resources that encourage resilience may help you to learn ways to practice those behaviors.  Keep to your normal routine as much as possible. If you have trouble focusing or doing normal activities, it is acceptable to take some time away from your normal routine.  Spend time with friends and loved ones.  Eat a  healthy diet, get plenty of sleep, and rest when you feel tired. Where to find more information: You can find more information about coping with loss from:  American Society of Clinical Oncology: www.cancer.net  American Psychological Association: TVStereos.ch  Contact a health care provider if:  Your grief is extreme and keeps getting worse.  You have ongoing grief that does not improve.  Your body shows symptoms of grief, such as illness.  You feel depressed, anxious, or lonely. Get help right away if:  You have  thoughts about hurting yourself or others. If you ever feel like you may hurt yourself or others, or have thoughts about taking your own life, get help right away. You can go to your nearest emergency department or call:  Your local emergency services (911 in the [place]  A suicide crisis helpline, such as the [facility] at (479)862-5544. This is open 24 hours a day.  Summary  Grief is a normal part of experiencing a loss. It is the result of a major change or an absence of something or someone that you count on.  The depth of grief and the period of recovery depend on the type of loss as well as your ability to adjust to the change and process your feelings.  Processing grief requires patience and a willingness to accept your feelings and talk about your loss with people who are supportive.  It is important to find resources that work for you and to realize that we are all different when it comes to grief. There is not one single grieving process that works for everyone in the same way.  Be aware that when grief becomes extreme, it can lead to more severe issues like isolation, depression, anxiety, or suicidal thoughts. Talk with your health care provider if you have any of these issues. This information is not intended to replace advice given to you by your health care provider. Make sure you discuss any questions you have with your health care provider. Document Released: 01/09/2017 Document Revised: 01/09/2017 Document Reviewed: 01/09/2017 Elsevier Interactive Patient Education  2018 [employer].

## 2018-02-19 ENCOUNTER — Other Ambulatory Visit: Payer: Self-pay | Admitting: Adult Health

## 2018-02-19 DIAGNOSIS — N183 Chronic kidney disease, stage 3 unspecified: Secondary | ICD-10-CM

## 2018-02-19 LAB — CBC WITH DIFFERENTIAL/PLATELET
BASOS ABS: 41 {cells}/uL (ref 0–200)
BASOS PCT: 0.6 %
EOS ABS: 117 {cells}/uL (ref 15–500)
Eosinophils Relative: 1.7 %
HCT: 37.7 % (ref 35.0–45.0)
Hemoglobin: 12.8 g/dL (ref 11.7–15.5)
Lymphs Abs: 2125 cells/uL (ref 850–3900)
MCH: 30 pg (ref 27.0–33.0)
MCHC: 34 g/dL (ref 32.0–36.0)
MCV: 88.3 fL (ref 80.0–100.0)
MPV: 9.1 fL (ref 7.5–12.5)
Monocytes Relative: 11 %
NEUTROS PCT: 55.9 %
Neutro Abs: 3857 cells/uL (ref 1500–7800)
PLATELETS: 309 10*3/uL (ref 140–400)
RBC: 4.27 10*6/uL (ref 3.80–5.10)
RDW: 13 % (ref 11.0–15.0)
TOTAL LYMPHOCYTE: 30.8 %
WBC: 6.9 10*3/uL (ref 3.8–10.8)
WBCMIX: 759 {cells}/uL (ref 200–950)

## 2018-02-19 LAB — LIPID PANEL
Cholesterol: 181 mg/dL (ref <200)
HDL: 58 mg/dL (ref >50)
LDL Cholesterol (Calc): 104 mg/dL (calc) — ABNORMAL HIGH
Non-HDL Cholesterol (Calc): 123 mg/dL (calc) (ref <130)
Total CHOL/HDL Ratio: 3.1 (calc) (ref <5.0)
Triglycerides: 93 mg/dL (ref <150)

## 2018-02-19 LAB — COMPLETE METABOLIC PANEL WITH GFR
AG RATIO: 1.9 (calc) (ref 1.0–2.5)
ALT: 13 U/L (ref 6–29)
AST: 17 U/L (ref 10–35)
Albumin: 4.2 g/dL (ref 3.6–5.1)
Alkaline phosphatase (APISO): 77 U/L (ref 33–130)
BILIRUBIN TOTAL: 0.3 mg/dL (ref 0.2–1.2)
BUN / CREAT RATIO: 25 (calc) — AB (ref 6–22)
BUN: 24 mg/dL (ref 7–25)
CHLORIDE: 105 mmol/L (ref 98–110)
CO2: 26 mmol/L (ref 20–32)
Calcium: 9.8 mg/dL (ref 8.6–10.4)
Creat: 0.95 mg/dL — ABNORMAL HIGH (ref 0.60–0.93)
GFR, EST AFRICAN AMERICAN: 68 mL/min/{1.73_m2} (ref >=60)
GFR, Est Non African American: 59 mL/min/{1.73_m2} — ABNORMAL LOW (ref >=60)
Globulin: 2.2 g/dL (calc) (ref 1.9–3.7)
Glucose, Bld: 94 mg/dL (ref 65–99)
POTASSIUM: 4.3 mmol/L (ref 3.5–5.3)
Sodium: 139 mmol/L (ref 135–146)
TOTAL PROTEIN: 6.4 g/dL (ref 6.1–8.1)

## 2018-02-19 LAB — VITAMIN D 25 HYDROXY (VIT D DEFICIENCY, FRACTURES): VIT D 25 HYDROXY: 91 ng/mL (ref 30–100)

## 2018-02-19 LAB — TSH: TSH: 0.3 m[IU]/L — AB (ref 0.40–4.50)

## 2018-03-05 DIAGNOSIS — I10 Essential (primary) hypertension: Secondary | ICD-10-CM | POA: Diagnosis not present

## 2018-03-05 DIAGNOSIS — R195 Other fecal abnormalities: Secondary | ICD-10-CM | POA: Diagnosis not present

## 2018-03-05 DIAGNOSIS — K219 Gastro-esophageal reflux disease without esophagitis: Secondary | ICD-10-CM | POA: Diagnosis not present

## 2018-03-17 DIAGNOSIS — D225 Melanocytic nevi of trunk: Secondary | ICD-10-CM | POA: Diagnosis not present

## 2018-03-17 DIAGNOSIS — L821 Other seborrheic keratosis: Secondary | ICD-10-CM | POA: Diagnosis not present

## 2018-03-17 DIAGNOSIS — D3611 Benign neoplasm of peripheral nerves and autonomic nervous system of face, head, and neck: Secondary | ICD-10-CM | POA: Diagnosis not present

## 2018-03-17 DIAGNOSIS — D1801 Hemangioma of skin and subcutaneous tissue: Secondary | ICD-10-CM | POA: Diagnosis not present

## 2018-03-23 ENCOUNTER — Ambulatory Visit: Payer: Medicare Other

## 2018-03-23 DIAGNOSIS — N183 Chronic kidney disease, stage 3 unspecified: Secondary | ICD-10-CM

## 2018-03-23 LAB — BASIC METABOLIC PANEL WITH GFR
BUN: 23 mg/dL (ref 7–25)
CALCIUM: 10.2 mg/dL (ref 8.6–10.4)
CHLORIDE: 105 mmol/L (ref 98–110)
CO2: 27 mmol/L (ref 20–32)
CREATININE: 0.66 mg/dL (ref 0.60–0.93)
GFR, EST AFRICAN AMERICAN: 100 mL/min/{1.73_m2} (ref >=60)
GFR, Est Non African American: 86 mL/min/{1.73_m2} (ref >=60)
GLUCOSE: 104 mg/dL — AB (ref 65–99)
Potassium: 4.4 mmol/L (ref 3.5–5.3)
Sodium: 138 mmol/L (ref 135–146)

## 2018-03-23 NOTE — Progress Notes (Signed)
Patient presents to the office for a nurse visit to have labs done to check her kidney functions. Patient states that she is drinking plenty of water and feels fine, no changes.

## 2018-03-30 DIAGNOSIS — K640 First degree hemorrhoids: Secondary | ICD-10-CM | POA: Diagnosis not present

## 2018-03-30 DIAGNOSIS — K5730 Diverticulosis of large intestine without perforation or abscess without bleeding: Secondary | ICD-10-CM | POA: Diagnosis not present

## 2018-03-30 DIAGNOSIS — D126 Benign neoplasm of colon, unspecified: Secondary | ICD-10-CM | POA: Diagnosis not present

## 2018-03-30 DIAGNOSIS — R195 Other fecal abnormalities: Secondary | ICD-10-CM | POA: Diagnosis not present

## 2018-03-30 LAB — HM COLONOSCOPY

## 2018-04-01 DIAGNOSIS — D126 Benign neoplasm of colon, unspecified: Secondary | ICD-10-CM | POA: Diagnosis not present

## 2018-04-02 ENCOUNTER — Other Ambulatory Visit: Payer: Self-pay | Admitting: Internal Medicine

## 2018-04-16 ENCOUNTER — Encounter: Payer: Self-pay | Admitting: *Deleted

## 2018-05-19 NOTE — Progress Notes (Signed)
Assessment and Plan:   Essential hypertension - continue medications, DASH diet, exercise and monitor at home. Call if greater than 130/80. -     CBC with Differential/Platelet -     Hepatic function panel -     BASIC METABOLIC PANEL WITH GFR -     TSH  Hyperlipidemia, unspecified hyperlipidemia type -continue medications, check lipids, decrease fatty foods, increase activity.  -     Lipid panel  Medication management -     Magnesium  Uncomplicated asthma, unspecified asthma severity, unspecified whether persistent -     predniSONE (DELTASONE) 20 MG tablet; 2 tablets daily for 3 days, 1 tablet daily for 4 days. -     azithromycin (ZITHROMAX) 250 MG tablet; Take 2 tablets (500 mg) on  Day 1,  followed by 1 tablet (250 mg) once daily on Days 2 through 5. -     fluticasone furoate-vilanterol (BREO ELLIPTA) 100-25 MCG/INH AEPB; Inhale 1 puff into the lungs daily. Rinse mouth with water after each use -     benzonatate (TESSALON) 200 MG capsule; Take 1 capsule (200 mg total) by mouth 3 (three) times daily as needed for cough (Max: 600mg  per day). Will hold the zpak and take if she is not getting better, increase fluids, rest, cont allergy pill    Continue diet and meds as discussed. Further disposition pending results of labs. Over 30 minutes of exam, counseling, chart review, and critical decision making was performed  Future Appointments  Date Time Provider [place]  05/21/2018  3:30 PM Vicie Mutters, PA-C [facility] None  08/11/2018  3:00 PM Vicie Mutters, PA-C [facility] None     HPI 77 y.o. female  presents for 3 month follow up on hypertension, cholesterol, prediabetes, and vitamin D deficiency.   Monday started to have sore throat, then Tuesday stuffy nose, then Wednesday cough and now her asthma is flared and she has a low grade temperature.    Her blood pressure has been controlled at home, today their BP is BP: 122/64  She does workout. She denies chest pain,  shortness of breath, dizziness.  She is on cholesterol medication and denies myalgias. Her cholesterol is at goal. The cholesterol last visit was:   Lab Results  Component Value Date   CHOL 181 02/18/2018   HDL 58 02/18/2018   LDLCALC 104 (H) 02/18/2018   TRIG 93 02/18/2018   CHOLHDL 3.1 02/18/2018     Lab Results  Component Value Date   GFRNONAA 86 03/23/2018     She has been working on diet and exercise for prediabetes, and denies paresthesia of the feet, polydipsia, polyuria and visual disturbances. Last A1C in the office was:  Lab Results  Component Value Date   HGBA1C 5.4 11/12/2017   Patient is on Vitamin D supplement.   Lab Results  Component Value Date   VD25OH 91 02/18/2018     BMI is Body mass index is 23.83 kg/m., she is working on diet and exercise. Wt Readings from Last 3 Encounters:  05/21/18 141 lb (64 kg)  02/18/18 140 lb 12.8 oz (63.9 kg)  11/12/17 141 lb 3.2 oz (64 kg)    Current Medications:  Current Outpatient Medications on File Prior to Visit  Medication Sig Dispense Refill  . albuterol (VENTOLIN HFA) 108 (90 Base) MCG/ACT inhaler USE 1 TO 2 INHALATIONS INTO THE LUNGS EVERY 6 HOURS AS NEEDED FOR WHEEZING OR SHORTNESS OF BREATH 18 Inhaler 3  . aspirin 81 MG chewable tablet Chew  81 mg by mouth daily.    . Calcium Carb-Cholecalciferol 1000-800 MG-UNIT TABS Take by mouth.    . cetirizine (ZYRTEC) 10 MG tablet Take 10 mg by mouth daily.    . Cholecalciferol (VITAMIN D) 2000 UNITS CAPS Take by mouth.    . cyanocobalamin 500 MCG tablet Take 500 mcg by mouth daily.    . diclofenac sodium (VOLTAREN) 1 % GEL APPLY 2 GRAMS TO AFFECTED AREA FOUR TIMES DAILY 100 g 0  . montelukast (SINGULAIR) 10 MG tablet TAKE 1 TABLET BY MOUTH AT BEDTIME 90 tablet 0  . Multiple Vitamins-Minerals (MULTIVITAMIN PO) Take by mouth daily.    . Omega-3 Fatty Acids (FISH OIL PO) Take by mouth daily.    Marland Kitchen omeprazole (PRILOSEC) 20 MG capsule TAKE 1 CAPSULE BY MOUTH EVERY DAY 90 capsule  1  . pravastatin (PRAVACHOL) 40 MG tablet TAKE 1 TABLET BY MOUTH EVERY DAY AS DIRECTED FOR CHOLESTEROL 90 tablet 1  . Probiotic Product (PROBIOTIC DAILY PO) Take by mouth daily.    . verapamil (CALAN) 120 MG tablet take 1 tablet by mouth twice a day (Patient taking differently: take 1 tablet by mouth once a day) 180 tablet 1   No current facility-administered medications on file prior to visit.    Medical History:  Past Medical History:  Diagnosis Date  . Anemia   . Asthma   . GERD (gastroesophageal reflux disease)   . Goiter   . Hyperlipidemia   . Hypertension   . MVP (mitral valve prolapse)   . Osteopenia   . Other abnormal glucose    Allergies:  Allergies  Allergen Reactions  . Adhesive [Tape] Hives  . Codeine Hives  . Fosamax [Alendronate Sodium] Other (See Comments)    Reflux increase  . Penicillins Hives  . Shellfish Allergy   . Iodine Rash     Review of Systems:  Review of Systems  Constitutional: Negative.   HENT: Negative.   Eyes: Negative.   Respiratory: Negative.   Cardiovascular: Negative.   Gastrointestinal: Negative.   Genitourinary: Negative.   Musculoskeletal: Negative.   Skin: Negative.   Neurological: Negative.   Endo/Heme/Allergies: Negative.   Psychiatric/Behavioral: Negative.     Family history- Review and unchanged Social history- Review and unchanged Physical Exam: BP 122/64   Pulse 87   Temp (!) 101.4 F (38.6 C)   Resp 16   Ht 5' 4.5" (1.638 m)   Wt 141 lb (64 kg)   SpO2 96%   BMI 23.83 kg/m  Wt Readings from Last 3 Encounters:  05/21/18 141 lb (64 kg)  02/18/18 140 lb 12.8 oz (63.9 kg)  11/12/17 141 lb 3.2 oz (64 kg)   General Appearance: Well nourished, in no apparent distress. Eyes: PERRLA, EOMs, conjunctiva no swelling or erythema Sinuses: No Frontal/maxillary tenderness ENT/Mouth: Ext aud canals clear, TMs without erythema, bulging. No erythema, swelling, or exudate on post pharynx.  Tonsils not swollen or  erythematous. Hearing normal.  Neck: Supple, thyroid normal.  Respiratory: Respiratory effort normal, BS equal bilaterally without rales, rhonchi, wheezing or stridor.  Cardio: RRR with systolic murmur LSB, Brisk peripheral pulses without edema.  Abdomen: Soft, + BS,  Non tender, no guarding, rebound, hernias, masses. Lymphatics: Non tender without lymphadenopathy.  Musculoskeletal: Full ROM, 5/5 strength, Normal gait Skin: Warm, dry without rashes, lesions, ecchymosis.  Neuro: Cranial nerves intact. Normal muscle tone, no cerebellar symptoms. Psych: Awake and oriented X 3, normal affect, Insight and Judgment appropriate.    Vicie Mutters, PA-C  3:29 PM [facility]

## 2018-05-21 ENCOUNTER — Encounter: Payer: Self-pay | Admitting: Physician Assistant

## 2018-05-21 ENCOUNTER — Ambulatory Visit: Payer: Medicare Other | Admitting: Physician Assistant

## 2018-05-21 VITALS — BP 122/64 | HR 87 | Temp 101.4°F | Resp 16 | Ht 64.5 in | Wt 141.0 lb

## 2018-05-21 DIAGNOSIS — E785 Hyperlipidemia, unspecified: Secondary | ICD-10-CM

## 2018-05-21 DIAGNOSIS — R7309 Other abnormal glucose: Secondary | ICD-10-CM | POA: Diagnosis not present

## 2018-05-21 DIAGNOSIS — J45909 Unspecified asthma, uncomplicated: Secondary | ICD-10-CM

## 2018-05-21 DIAGNOSIS — Z79899 Other long term (current) drug therapy: Secondary | ICD-10-CM

## 2018-05-21 DIAGNOSIS — I10 Essential (primary) hypertension: Secondary | ICD-10-CM

## 2018-05-21 MED ORDER — BENZONATATE 200 MG PO CAPS: 200.0000 mg | ORAL_CAPSULE | Freq: Three times a day (TID) | ORAL | 0 refills | Status: DC | PRN

## 2018-05-21 MED ORDER — AZITHROMYCIN 250 MG PO TABS: ORAL_TABLET | ORAL | 1 refills | Status: AC

## 2018-05-21 MED ORDER — PREDNISONE 20 MG PO TABS: ORAL_TABLET | ORAL | 0 refills | Status: DC

## 2018-05-21 MED ORDER — FLUTICASONE FUROATE-VILANTEROL 100-25 MCG/INH IN AEPB: 1.0000 | INHALATION_SPRAY | Freq: Every day | RESPIRATORY_TRACT | 0 refills | Status: DC

## 2018-05-21 NOTE — Patient Instructions (Signed)
COLD INFORMATION  Try just the allergy pill and prednisone for a few days, you always want to give your body 10 days to fight off infection with help before taking an anabiotic.   BEWARE ANTIBIOTICS Antibiotics have been linked with colon infections, resistance and newest theory is colon cancer in 40-50 year olds. So it is VERY important to try to avoid them, antibiotics are NOT risk free medications.   If you are not feeling better make an office visit OR CONTACT US.   Here is more info below HOW TO TREAT VIRAL COUGH AND COLD SYMPTOMS:  -Symptoms usually last at least 1 week with the worst symptoms being around day 4.  - colds usually start with a sore throat and end with a cough, and the cough can take 2 weeks to get better.  -No antibiotics are needed for colds, flu, sore throats, cough, bronchitis UNLESS symptoms are longer than 7 days OR if you are getting better then get drastically worse.  -There are a lot of combination medications (Dayquil, Nyquil, Vicks 44, tyelnol cold and sinus, ETC). Please look at the ingredients on the back so that you are treating the correct symptoms and not doubling up on medications/ingredients.    Medicines you can use  Nasal congestion  Little Remedies saline spray (aerosol/mist)- can try this, it is in the kids section - pseudoephedrine (Sudafed)- behind the counter, do not use if you have high blood pressure, medicine that have -D in them.  - phenylephrine (Sudafed PE) -Dextormethorphan + chlorpheniramine (Coridcidin HBP)- okay if you have high blood pressure -Oxymetazoline (Afrin) nasal spray- LIMIT to 3 days -Saline nasal spray -Neti pot (used distilled or bottled water)  Ear pain/congestion  -pseudoephedrine (sudafed) - Nasonex/flonase nasal spray  Fever  -Acetaminophen (Tyelnol) -Ibuprofen (Advil, motrin, aleve)  Sore Throat  -Acetaminophen (Tyelnol) -Ibuprofen (Advil, motrin, aleve) -Drink a lot of water -Gargle with salt water -  Rest your voice (don't talk) -Throat sprays -Cough drops  Body Aches  -Acetaminophen (Tyelnol) -Ibuprofen (Advil, motrin, aleve)  Headache  -Acetaminophen (Tyelnol) -Ibuprofen (Advil, motrin, aleve) - Exedrin, Exedrin Migraine  Allergy symptoms (cough, sneeze, runny nose, itchy eyes) -Claritin or loratadine cheapest but likely the weakest  -Zyrtec or certizine at night because it can make you sleepy -The strongest is allegra or fexafinadine  Cheapest at [employer], [employer], [facility]  Cough  -Dextromethorphan (Delsym)- medicine that has DM in it -Guafenesin (Mucinex/Robitussin) - cough drops - drink lots of water  Chest Congestion  -Guafenesin (Mucinex/Robitussin)  Red Itchy Eyes  - Naphcon-A  Upset Stomach  - Bland diet (nothing spicy, greasy, fried, and high acid foods like tomatoes, oranges, berries) -OKAY- cereal, bread, soup, crackers, rice -Eat smaller more frequent meals -reduce caffeine, no alcohol -Loperamide (Imodium-AD) if diarrhea -Prevacid for heart burn  General health when sick  -Hydration -wash your hands frequently -keep surfaces clean -change pillow cases and sheets often -Get fresh air but do not exercise strenuously -Vitamin D, double up on it - Vitamin C -Zinc   

## 2018-05-22 LAB — CBC WITH DIFFERENTIAL/PLATELET
BASOS ABS: 37 {cells}/uL (ref 0–200)
Basophils Relative: 0.5 %
EOS PCT: 1.1 %
Eosinophils Absolute: 80 cells/uL (ref 15–500)
HCT: 38 % (ref 35.0–45.0)
HEMOGLOBIN: 13 g/dL (ref 11.7–15.5)
LYMPHS ABS: 1365 {cells}/uL (ref 850–3900)
MCH: 30.7 pg (ref 27.0–33.0)
MCHC: 34.2 g/dL (ref 32.0–36.0)
MCV: 89.8 fL (ref 80.0–100.0)
MONOS PCT: 13.7 %
MPV: 9.4 fL (ref 7.5–12.5)
NEUTROS ABS: 4818 {cells}/uL (ref 1500–7800)
Neutrophils Relative %: 66 %
PLATELETS: 268 10*3/uL (ref 140–400)
RBC: 4.23 10*6/uL (ref 3.80–5.10)
RDW: 12.3 % (ref 11.0–15.0)
Total Lymphocyte: 18.7 %
WBC mixed population: 1000 cells/uL — ABNORMAL HIGH (ref 200–950)
WBC: 7.3 10*3/uL (ref 3.8–10.8)

## 2018-05-22 LAB — COMPLETE METABOLIC PANEL WITH GFR
AG RATIO: 1.9 (calc) (ref 1.0–2.5)
ALBUMIN MSPROF: 4.2 g/dL (ref 3.6–5.1)
ALT: 15 U/L (ref 6–29)
AST: 19 U/L (ref 10–35)
Alkaline phosphatase (APISO): 81 U/L (ref 33–130)
BUN: 24 mg/dL (ref 7–25)
CALCIUM: 9.9 mg/dL (ref 8.6–10.4)
CO2: 25 mmol/L (ref 20–32)
CREATININE: 0.82 mg/dL (ref 0.60–0.93)
Chloride: 104 mmol/L (ref 98–110)
GFR, EST NON AFRICAN AMERICAN: 70 mL/min/{1.73_m2} (ref >=60)
GFR, Est African American: 81 mL/min/{1.73_m2} (ref >=60)
GLOBULIN: 2.2 g/dL (ref 1.9–3.7)
Glucose, Bld: 97 mg/dL (ref 65–99)
POTASSIUM: 4.4 mmol/L (ref 3.5–5.3)
SODIUM: 139 mmol/L (ref 135–146)
Total Bilirubin: 0.4 mg/dL (ref 0.2–1.2)
Total Protein: 6.4 g/dL (ref 6.1–8.1)

## 2018-05-22 LAB — LIPID PANEL
CHOL/HDL RATIO: 3.2 (calc) (ref <5.0)
Cholesterol: 173 mg/dL (ref <200)
HDL: 54 mg/dL (ref >50)
LDL CHOLESTEROL (CALC): 99 mg/dL
Non-HDL Cholesterol (Calc): 119 mg/dL (calc) (ref <130)
Triglycerides: 100 mg/dL (ref <150)

## 2018-05-22 LAB — TSH: TSH: 0.43 mIU/L (ref 0.40–4.50)

## 2018-05-25 ENCOUNTER — Other Ambulatory Visit: Payer: Self-pay

## 2018-05-25 ENCOUNTER — Telehealth: Payer: Self-pay | Admitting: Internal Medicine

## 2018-05-25 MED ORDER — ALBUTEROL SULFATE HFA 108 (90 BASE) MCG/ACT IN AERS: INHALATION_SPRAY | RESPIRATORY_TRACT | 3 refills | Status: DC

## 2018-05-25 NOTE — Telephone Encounter (Signed)
REFILL HAS BEEN SENT TO [facility] AS REQUESTED

## 2018-05-25 NOTE — Telephone Encounter (Signed)
Patient requests renewal on ventolin. pharm said not on file [facility] [place]

## 2018-07-07 ENCOUNTER — Other Ambulatory Visit: Payer: Self-pay | Admitting: Adult Health

## 2018-07-31 ENCOUNTER — Other Ambulatory Visit: Payer: Self-pay | Admitting: Internal Medicine

## 2018-07-31 DIAGNOSIS — Z1231 Encounter for screening mammogram for malignant neoplasm of breast: Secondary | ICD-10-CM

## 2018-08-11 ENCOUNTER — Encounter: Payer: Self-pay | Admitting: Physician Assistant

## 2018-08-12 ENCOUNTER — Other Ambulatory Visit: Payer: Self-pay | Admitting: Internal Medicine

## 2018-08-17 DIAGNOSIS — H04123 Dry eye syndrome of bilateral lacrimal glands: Secondary | ICD-10-CM | POA: Diagnosis not present

## 2018-08-17 DIAGNOSIS — H10413 Chronic giant papillary conjunctivitis, bilateral: Secondary | ICD-10-CM | POA: Diagnosis not present

## 2018-08-17 DIAGNOSIS — Z961 Presence of intraocular lens: Secondary | ICD-10-CM | POA: Diagnosis not present

## 2018-08-25 NOTE — Progress Notes (Signed)
Medicare wellness and OV  Assessment:   Essential hypertension - continue medications, DASH diet, exercise and monitor at home. Call if greater than 130/80.   Hyperlipidemia, unspecified hyperlipidemia type -continue medications, check lipids, decrease fatty foods, increase activity.   Prediabetes Discussed disease progression and risks Discussed diet/exercise, weight management and risk modification  Medication management -     Magnesium  Anemia, unspecified type - monitor  Gastroesophageal reflux disease, esophagitis presence not specified Continue PPI/H2 blocker, diet discussed  Uncomplicated asthma, unspecified asthma severity, unspecified whether persistent Monitor, controlled  MVP (mitral valve prolapse) Monitor, no symptoms  Vitamin D deficiency Continue supplement  Encounter for Medicare annual wellness exam 1 year  BMI 23.0-23.9, adult Monitor  Future Appointments  Date Time Provider [place]  09/15/2018  4:00 PM [facility] 3 [facility]     Plan:   During the course of the visit the patient was educated and counseled about appropriate screening and preventive services including:    Pneumococcal vaccine   Influenza vaccine  Td vaccine  Screening electrocardiogram  Screening mammography  Bone densitometry screening  Colorectal cancer screening  Diabetes screening  Glaucoma screening  Nutrition counseling   Subjective:   Isabel Oliver is a  77 y.o. WMF (first Xmas without Fred/Dick) who presents for Medicare Annual Wellness Visit and follow up for chronic illness including HTN, chol, preDM, vitamin D def, osteoporosis.    Had + cologuard with subsequent colonoscopy 04/2018, benign polyp, 5 years with Dr. Oletta Lamas.   Her blood pressure has been controlled at home, today their BP is BP: 124/78 She does workout, still works at [employer] 20 hours a week and walks a lot. She denies chest pain, shortness of breath, dizziness.    She is on cholesterol medication and denies myalgias. Her cholesterol is not at goal. The cholesterol last visit was:   Lab Results  Component Value Date   CHOL 173 05/21/2018   HDL 54 05/21/2018   LDLCALC 99 05/21/2018   TRIG 100 05/21/2018   CHOLHDL 3.2 05/21/2018    Last A1C in the office was:  Lab Results  Component Value Date   HGBA1C 5.4 11/12/2017   Patient is on Vitamin D supplement.   Lab Results  Component Value Date   VD25OH 91 02/18/2018   She has history of osteoporosis, could not tolerate calcitonin or fosamax due to her GERD.  BMI is Body mass index is 25.72 kg/m., she is working on diet and exercise, she has been avoiding white bread/white pasta, and has been increasing salads.  Wt Readings from Last 3 Encounters:  08/26/18 145 lb 3.2 oz (65.9 kg)  05/21/18 141 lb (64 kg)  02/18/18 140 lb 12.8 oz (63.9 kg)     Names of Other Physician/Practitioners you currently use: 1. [facility]- here for primary care 2. Dr. Katy Fitch, eye doctor, 06/2018 3. Dr Charline Bills. In [place], dentist, last visit q 6 months Patient Care Team: Unk Pinto, MD as PCP - General (Internal Medicine) Laurence Spates, MD as Consulting Physician (Gastroenterology) Gus Height, MD as Consulting Physician (Obstetrics and Gynecology) Danella Sensing, MD as Consulting Physician (Dermatology)   Medication Review Current Outpatient Medications on File Prior to Visit  Medication Sig Dispense Refill  . albuterol (VENTOLIN HFA) 108 (90 Base) MCG/ACT inhaler USE 1 TO 2 INHALATIONS INTO THE LUNGS EVERY 6 HOURS AS NEEDED FOR WHEEZING OR SHORTNESS OF BREATH 1 Inhaler 3  . aspirin 81 MG chewable tablet Chew  81 mg by mouth daily.    . Calcium Carb-Cholecalciferol 1000-800 MG-UNIT TABS Take by mouth.    . cetirizine (ZYRTEC) 10 MG tablet Take 10 mg by mouth daily.    . Cholecalciferol (VITAMIN D) 2000 UNITS CAPS Take by mouth.    . cyanocobalamin 500 MCG tablet  Take 500 mcg by mouth daily.    . diclofenac sodium (VOLTAREN) 1 % GEL APPLY 2 GRAMS TO AFFECTED AREA FOUR TIMES DAILY 100 g 0  . montelukast (SINGULAIR) 10 MG tablet TAKE 1 TABLET BY MOUTH AT BEDTIME 90 tablet 0  . Multiple Vitamins-Minerals (MULTIVITAMIN PO) Take by mouth daily.    . Omega-3 Fatty Acids (FISH OIL PO) Take by mouth daily.    . pravastatin (PRAVACHOL) 40 MG tablet TAKE 1 TABLET BY MOUTH EVERY DAY AS DIRECTED FOR CHOLESTEROL 90 tablet 3  . Probiotic Product (PROBIOTIC DAILY PO) Take by mouth daily.    . verapamil (CALAN) 120 MG tablet take 1 tablet by mouth twice a day (Patient taking differently: take 1 tablet by mouth once a day) 180 tablet 1   No current facility-administered medications on file prior to visit.     Current Problems (verified) Patient Active Problem List   Diagnosis Date Noted  . Positive colorectal cancer screening using Cologuard test 08/11/2017  . Encounter for Medicare annual wellness exam 03/29/2015  . Medication management 12/05/2014  . Vitamin D deficiency 12/05/2014  . Other abnormal glucose   . MVP (mitral valve prolapse)   . Hypertension   . Hyperlipidemia   . GERD (gastroesophageal reflux disease)   . Asthma     Screening Tests Immunization History  Administered Date(s) Administered  . Influenza, High Dose Seasonal PF 07/14/2014, 05/21/2016, 06/20/2017  . Influenza-Unspecified 06/28/2013, 07/06/2015  . Pneumococcal Conjugate-13 02/01/2014  . Pneumococcal-Unspecified 09/10/2007  . Tdap 12/05/2009  . Zoster 09/09/2005   Preventative care: Last colonoscopy: 04/2018  Last mammogram: 06/2017 CAT B- getting next week Last pap smear/pelvic exam: 2011 remote DEXA: 07/16/2015+ will get next year osteoporsis- on D 3 and calcium  Prior vaccinations: TD or Tdap: 2011  Influenza: 2019  Pneumococcal: 2009 Prevnar 13: 2015 Shingles/Zostavax: 2007  Allergies Allergies  Allergen Reactions  . Adhesive [Tape] Hives  . Codeine Hives  .  Fosamax [Alendronate Sodium] Other (See Comments)    Reflux increase  . Penicillins Hives  . Shellfish Allergy   . Iodine Rash    SURGICAL HISTORY She  has a past surgical history that includes Appendectomy; Cholecystectomy; Eye surgery; and Tonsillectomy and adenoidectomy. FAMILY HISTORY Her family history includes Diabetes in her father; Heart disease in her father; Hyperlipidemia in her brother and sister; Hypertension in her mother. SOCIAL HISTORY She  reports that she has never smoked. She has never used smokeless tobacco. She reports that she does not drink alcohol or use drugs.  MEDICARE WELLNESS OBJECTIVES: Physical activity:   Cardiac risk factors:   Depression/mood screen:   Depression screen [facility] 2/9 02/18/2018  Decreased Interest 0  Down, Depressed, Hopeless 0  PHQ - 2 Score 0    ADLs:  In your present state of health, do you have any difficulty performing the following activities: 11/12/2017  Hearing? N  Vision? N  Difficulty concentrating or making decisions? N  Walking or climbing stairs? N  Dressing or bathing? N  Doing errands, shopping? N  Some recent data might be hidden     Cognitive Testing  Alert? Yes  Normal Appearance?Yes  Oriented to person? Yes  Place? Yes   Time? Yes  Recall of three objects?  Yes  Can perform simple calculations? Yes  Displays appropriate judgment?Yes  Can read the correct time from a watch face?Yes  EOL planning: Does Patient Have a Medical Advance Directive?: Yes Type of Advance Directive: Healthcare Power of Attorney, Living will [place] in Chart?: No - copy requested   Objective:     Blood pressure 124/78, pulse 73, temperature 98.2 F (36.8 C), height 5\' 3"  (1.6 m), weight 145 lb 3.2 oz (65.9 kg), SpO2 98 %. Body mass index is 25.72 kg/m.  General appearance: alert, no distress, WD/WN,  female HEENT: normocephalic, sclerae anicteric, TMs pearly, nares patent, no discharge or erythema,  pharynx normal Oral cavity: MMM, no lesions Neck: supple, no lymphadenopathy, no thyromegaly, no masses Heart: RRR, normal S1, S2, no murmurs Lungs: CTA bilaterally, no wheezes, rhonchi, or rales Abdomen: +bs, soft, non tender, non distended, no masses, no hepatomegaly, no splenomegaly Musculoskeletal: nontender, no swelling, no obvious deformity Extremities: no edema, no cyanosis, no clubbing Pulses: 2+ symmetric, upper and lower extremities, normal cap refill Neurological: alert, oriented x 3, CN2-12 intact, strength normal upper extremities and lower extremities, sensation normal throughout, DTRs 2+ throughout, no cerebellar signs, gait normal Psychiatric: normal affect, behavior normal, pleasant  Breast: defer Gyn: defer Rectal: defer  Medicare Attestation I have personally reviewed: The patient's medical and social history Their use of alcohol, tobacco or illicit drugs Their current medications and supplements The patient's functional ability including ADLs,fall risks, home safety risks, cognitive, and hearing and visual impairment Diet and physical activities Evidence for depression or mood disorders  The patient's weight, height, BMI, and visual acuity have been recorded in the chart.  I have made referrals, counseling, and provided education to the patient based on review of the above and I have provided the patient with a written personalized care plan for preventive services.     Vicie Mutters, PA-C   08/26/2018

## 2018-08-26 ENCOUNTER — Other Ambulatory Visit: Payer: Self-pay | Admitting: Internal Medicine

## 2018-08-26 ENCOUNTER — Encounter: Payer: Self-pay | Admitting: Physician Assistant

## 2018-08-26 ENCOUNTER — Ambulatory Visit: Payer: Medicare Other | Admitting: Physician Assistant

## 2018-08-26 VITALS — BP 124/78 | HR 73 | Temp 98.2°F | Ht 63.0 in | Wt 145.2 lb

## 2018-08-26 DIAGNOSIS — I341 Nonrheumatic mitral (valve) prolapse: Secondary | ICD-10-CM

## 2018-08-26 DIAGNOSIS — M8580 Other specified disorders of bone density and structure, unspecified site: Secondary | ICD-10-CM

## 2018-08-26 DIAGNOSIS — Z0001 Encounter for general adult medical examination with abnormal findings: Secondary | ICD-10-CM

## 2018-08-26 DIAGNOSIS — E785 Hyperlipidemia, unspecified: Secondary | ICD-10-CM

## 2018-08-26 DIAGNOSIS — J45909 Unspecified asthma, uncomplicated: Secondary | ICD-10-CM | POA: Diagnosis not present

## 2018-08-26 DIAGNOSIS — Z79899 Other long term (current) drug therapy: Secondary | ICD-10-CM

## 2018-08-26 DIAGNOSIS — R7309 Other abnormal glucose: Secondary | ICD-10-CM | POA: Diagnosis not present

## 2018-08-26 DIAGNOSIS — Z0000 Encounter for general adult medical examination without abnormal findings: Secondary | ICD-10-CM

## 2018-08-26 DIAGNOSIS — E559 Vitamin D deficiency, unspecified: Secondary | ICD-10-CM

## 2018-08-26 DIAGNOSIS — K219 Gastro-esophageal reflux disease without esophagitis: Secondary | ICD-10-CM

## 2018-08-26 DIAGNOSIS — I10 Essential (primary) hypertension: Secondary | ICD-10-CM

## 2018-08-26 DIAGNOSIS — R6889 Other general symptoms and signs: Secondary | ICD-10-CM | POA: Diagnosis not present

## 2018-08-26 NOTE — Patient Instructions (Signed)
Osteoporosis  Osteoporosis is thinning and loss of density in your bones. Osteoporosis makes bones more brittle and fragile and more likely to break (fracture). Over time, osteoporosis can cause your bones to become so weak that they fracture after a minor fall. Bones in the hip, wrist, and spine are most likely to fracture due to osteoporosis. What are the causes? The exact cause of this condition is not known. What increases the risk? You may be at greater risk for osteoporosis if you:  Have a family history of the condition.  Have poor nutrition.  Use steroid medicines, such as prednisone.  Are female.  Are age 76 or older.  Smoke or have a history of smoking.  Are not physically active (are sedentary).  Are white (Caucasian) or of Asian descent.  Have a small body frame.  Take certain medicines, such as antiseizure medicines. What are the signs or symptoms? A fracture might be the first sign of osteoporosis, especially if the fracture results from a fall or injury that usually would not cause a bone to break. Other signs and symptoms include:  Pain in the neck or low back.  Stooped posture.  Loss of height. How is this diagnosed? This condition may be diagnosed based on:  Your medical history.  A physical exam.  A bone mineral density test, also called a DXA or DEXA test (dual-energy X-ray absorptiometry test). This test uses X-rays to measure the amount of minerals in your bones. How is this treated? The goal of treatment is to strengthen your bones and lower your risk for a fracture. Treatment may involve:  Making lifestyle changes, such as: ? Including foods with more calcium and vitamin D in your diet. ? Doing weight-bearing and muscle-strengthening exercises. ? Stopping tobacco use. ? Limiting alcohol intake.  Taking medicine to slow the process of bone loss or to increase bone density.  Taking daily supplements of calcium and vitamin D.  Taking  hormone replacement medicines, such as estrogen for women and testosterone for men.  Monitoring your levels of calcium and vitamin D. Follow these instructions at home:  Activity  Exercise as told by your health care provider. Ask your health care provider what exercises and activities are safe for you. You should do: ? Exercises that make you work against gravity (weight-bearing exercises), such as tai chi, yoga, or walking. ? Exercises to strengthen muscles, such as lifting weights. Lifestyle  Limit alcohol intake to no more than 1 drink a day for nonpregnant women and 2 drinks a day for men. One drink equals 12 oz of beer, 5 oz of wine, or 1 oz of hard liquor.  Do not use any products that contain nicotine or tobacco, such as cigarettes and e-cigarettes. If you need help quitting, ask your health care provider. Preventing falls  Use devices to help you move around (mobility aids) as needed, such as canes, walkers, scooters, or crutches.  Keep rooms well-lit and clutter-free.  Remove tripping hazards from walkways, including cords and throw rugs.  Install grab bars in bathrooms and safety rails on stairs.  Use rubber mats in the bathroom and other areas that are often wet or slippery.  Wear closed-toe shoes that fit well and support your feet. Wear shoes that have rubber soles or low heels.  Review your medicines with your health care provider. Some medicines can cause dizziness or changes in blood pressure, which can increase your risk of falling. General instructions  Include calcium and vitamin D in  your diet. Calcium is important for bone health, and vitamin D helps your body to absorb calcium. Good sources of calcium and vitamin D include: ? Certain fatty fish, such as salmon and tuna. ? Products that have calcium and vitamin D added to them (fortified products), such as fortified cereals. ? Egg yolks. ? Cheese. ? Liver.  Take over-the-counter and prescription medicines  only as told by your health care provider.  Keep all follow-up visits as told by your health care provider. This is important. Contact a health care provider if:  You have never been screened for osteoporosis and you are: ? A woman who is age 76 or older. ? A man who is age 21 or older. Get help right away if:  You fall or injure yourself. Summary  Osteoporosis is thinning and loss of density in your bones. This makes bones more brittle and fragile and more likely to break (fracture),even with minor falls.  The goal of treatment is to strengthen your bones and reduce your risk for a fracture.  Include calcium and vitamin D in your diet. Calcium is important for bone health, and vitamin D helps your body to absorb calcium.  Talk with your health care provider about screening for osteoporosis if you are a woman who is age 76 or older, or a man who is age 76 or older. This information is not intended to replace advice given to you by your health care provider. Make sure you discuss any questions you have with your health care provider. Document Released: 06/05/2005 Document Revised: 06/20/2017 Document Reviewed: 06/20/2017 Elsevier Interactive Patient Education  2019 [employer].

## 2018-09-15 ENCOUNTER — Ambulatory Visit
Admission: RE | Admit: 2018-09-15 | Discharge: 2018-09-15 | Disposition: A | Discharge status: Home / Self Care | Payer: Medicare Other | Source: Ambulatory Visit | Attending: Internal Medicine | Admitting: Internal Medicine

## 2018-09-15 DIAGNOSIS — Z1231 Encounter for screening mammogram for malignant neoplasm of breast: Secondary | ICD-10-CM | POA: Diagnosis not present

## 2018-09-28 ENCOUNTER — Other Ambulatory Visit: Payer: Self-pay | Admitting: Adult Health

## 2018-12-25 ENCOUNTER — Other Ambulatory Visit: Payer: Self-pay | Admitting: Physician Assistant

## 2019-01-30 ENCOUNTER — Other Ambulatory Visit: Payer: Self-pay | Admitting: Internal Medicine

## 2019-01-30 DIAGNOSIS — K219 Gastro-esophageal reflux disease without esophagitis: Secondary | ICD-10-CM

## 2019-01-30 MED ORDER — OMEPRAZOLE 20 MG PO CPDR: DELAYED_RELEASE_CAPSULE | ORAL | 0 refills | Status: DC

## 2019-03-08 ENCOUNTER — Other Ambulatory Visit: Payer: Self-pay

## 2019-03-08 ENCOUNTER — Encounter: Payer: Self-pay | Admitting: Internal Medicine

## 2019-03-08 ENCOUNTER — Ambulatory Visit: Payer: Medicare Other | Admitting: Internal Medicine

## 2019-03-08 VITALS — BP 136/78 | HR 72 | Temp 97.2°F | Resp 16 | Ht 63.5 in | Wt 144.0 lb

## 2019-03-08 DIAGNOSIS — Z0000 Encounter for general adult medical examination without abnormal findings: Secondary | ICD-10-CM | POA: Diagnosis not present

## 2019-03-08 DIAGNOSIS — E782 Mixed hyperlipidemia: Secondary | ICD-10-CM | POA: Diagnosis not present

## 2019-03-08 DIAGNOSIS — Z8249 Family history of ischemic heart disease and other diseases of the circulatory system: Secondary | ICD-10-CM | POA: Diagnosis not present

## 2019-03-08 DIAGNOSIS — R7303 Prediabetes: Secondary | ICD-10-CM

## 2019-03-08 DIAGNOSIS — R7309 Other abnormal glucose: Secondary | ICD-10-CM

## 2019-03-08 DIAGNOSIS — I10 Essential (primary) hypertension: Secondary | ICD-10-CM

## 2019-03-08 DIAGNOSIS — K219 Gastro-esophageal reflux disease without esophagitis: Secondary | ICD-10-CM

## 2019-03-08 DIAGNOSIS — Z136 Encounter for screening for cardiovascular disorders: Secondary | ICD-10-CM | POA: Diagnosis not present

## 2019-03-08 DIAGNOSIS — N183 Chronic kidney disease, stage 3 unspecified: Secondary | ICD-10-CM

## 2019-03-08 DIAGNOSIS — Z0001 Encounter for general adult medical examination with abnormal findings: Secondary | ICD-10-CM

## 2019-03-08 DIAGNOSIS — Z1211 Encounter for screening for malignant neoplasm of colon: Secondary | ICD-10-CM

## 2019-03-08 DIAGNOSIS — Z79899 Other long term (current) drug therapy: Secondary | ICD-10-CM

## 2019-03-08 DIAGNOSIS — E559 Vitamin D deficiency, unspecified: Secondary | ICD-10-CM

## 2019-03-08 NOTE — Progress Notes (Signed)
ADULT & ADOLESCENT INTERNAL MEDICINE Unk Pinto, M.D.     Uvaldo Bristle. Silverio Lay, P.A.-C Liane Comber, [place] [address] Telephone [phone] Telefax [phone] _______________________________________________ Annual Screening/Preventative Visit & Comprehensive Evaluation &  Examination     This very nice 78 y.o. MWF presents for a Screening /Preventative Visit & comprehensive evaluation and management of multiple medical co-morbidities.  Patient has been followed for HTN, HLD, Prediabetes  and Vitamin D Deficiency. Patient has hx/o GERD controlled w/Omeprazole.      HTN predates since 1998. Patient's BP has been controlled at home and patient denies any cardiac symptoms as chest pain, palpitations, shortness of breath, dizziness or ankle swelling. Today's BP: 136/78      Patient's hyperlipidemia is controlled with diet and Pravastatin. Patient denies myalgias or other medication SE's. Last lipids were at goal: Lab Results  Component Value Date   CHOL 173 05/21/2018   HDL 54 05/21/2018   LDLCALC 99 05/21/2018   TRIG 100 05/21/2018   CHOLHDL 3.2 05/21/2018      Patient has hx/o prediabetes predating since      and patient denies reactive hypoglycemic symptoms, visual blurring, diabetic polys or paresthesias. Last A1c was Normal & at goal: Lab Results  Component Value Date   HGBA1C 5.4 11/12/2017      Finally, patient has history of Vitamin D Deficiency and last Vitamin D was at goal: Lab Results  Component Value Date   VD25OH 91 02/18/2018   Current Outpatient Medications on File Prior to Visit  Medication Sig  . albuterol (VENTOLIN HFA) 108 (90 Base) MCG/ACT inhaler USE 1 TO 2 INHALATIONS INTO THE LUNGS EVERY 6 HOURS AS NEEDED FOR WHEEZING OR SHORTNESS OF BREATH  . aspirin 81 MG chewable tablet Chew 81 mg by mouth daily.  . Calcium Carb-Cholecalciferol 1000-800 MG-UNIT TABS Take by mouth.  .  cetirizine (ZYRTEC) 10 MG tablet Take 10 mg by mouth daily.  . Cholecalciferol (VITAMIN D) 2000 UNITS CAPS Take by mouth.  . cyanocobalamin 500 MCG tablet Take 500 mcg by mouth daily.  . diclofenac sodium (VOLTAREN) 1 % GEL APPLY 2 GRAMS TO AFFECTED AREA FOUR TIMES DAILY  . montelukast (SINGULAIR) 10 MG tablet Take 1 tablet Daily for Allergies  . Multiple Vitamins-Minerals (MULTIVITAMIN PO) Take by mouth daily.  . Omega-3 Fatty Acids (FISH OIL PO) Take by mouth daily.  Marland Kitchen omeprazole (PRILOSEC) 20 MG capsule Take 1 capsule Daily for Acid Indigestion & Reflux  . pravastatin (PRAVACHOL) 40 MG tablet TAKE 1 TABLET BY MOUTH EVERY DAY AS DIRECTED FOR CHOLESTEROL  . Probiotic Product (PROBIOTIC DAILY PO) Take by mouth daily.  . verapamil (CALAN) 120 MG tablet take 1 tablet by mouth twice a day (Patient taking differently: take 1 tablet by mouth once a day)   No current facility-administered medications on file prior to visit.    Allergies  Allergen Reactions  . Adhesive [Tape] Hives  . Codeine Hives  . Fosamax [Alendronate Sodium] Other (See Comments)    Reflux increase  . Penicillins Hives  . Shellfish Allergy   . Iodine Rash   Past Medical History:  Diagnosis Date  . Anemia   . Asthma   . GERD (gastroesophageal reflux disease)   . Goiter   . Hyperlipidemia   . Hypertension   . MVP (mitral valve prolapse)   . Osteopenia   . Other abnormal glucose    Health Maintenance  Topic Date Due  .  INFLUENZA VACCINE  04/10/2019  . TETANUS/TDAP  12/06/2019  . COLONOSCOPY  03/31/2023  . DEXA SCAN  Completed  . PNA vac Low Risk Adult  Completed   Immunization History  Administered Date(s) Administered  . Influenza, High Dose Seasonal PF 07/14/2014, 05/21/2016, 06/20/2017  . Influenza-Unspecified 06/28/2013, 07/06/2015  . Pneumococcal Conjugate-13 02/01/2014  . Pneumococcal-Unspecified 09/10/2007  . Tdap 12/05/2009  . Zoster 09/09/2005   Last Colon - 09/30/2017 - Dr Arty Baumgartner recc 5 yr  f/u due Jan 2024  Last MGM - 09/15/2018  Past Surgical History:  Procedure Laterality Date  . APPENDECTOMY    . CHOLECYSTECTOMY    . EYE SURGERY    . TONSILLECTOMY AND ADENOIDECTOMY     Family History  Problem Relation Age of Onset  . Hypertension Mother   . Diabetes Father   . Heart disease Father   . Hyperlipidemia Sister   . Hyperlipidemia Brother   . Breast cancer Neg Hx    Social History   Tobacco Use  . Smoking status: Never Smoker  . Smokeless tobacco: Never Used  Substance Use Topics  . Alcohol use: No  . Drug use: No    ROS Constitutional: Denies fever, chills, weight loss/gain, headaches, insomnia,  night sweats, and change in appetite. Does c/o fatigue. Eyes: Denies redness, blurred vision, diplopia, discharge, itchy, watery eyes.  ENT: Denies discharge, congestion, post nasal drip, epistaxis, sore throat, earache, hearing loss, dental pain, Tinnitus, Vertigo, Sinus pain, snoring.  Cardio: Denies chest pain, palpitations, irregular heartbeat, syncope, dyspnea, diaphoresis, orthopnea, PND, claudication, edema Respiratory: denies cough, dyspnea, DOE, pleurisy, hoarseness, laryngitis, wheezing.  Gastrointestinal: Denies dysphagia, heartburn, reflux, water brash, pain, cramps, nausea, vomiting, bloating, diarrhea, constipation, hematemesis, melena, hematochezia, jaundice, hemorrhoids Genitourinary: Denies dysuria, frequency, urgency, nocturia, hesitancy, discharge, hematuria, flank pain Breast: Breast lumps, nipple discharge, bleeding.  Musculoskeletal: Denies arthralgia, myalgia, stiffness, Jt. Swelling, pain, limp, and strain/sprain. Denies falls. Skin: Denies puritis, rash, hives, warts, acne, eczema, changing in skin lesion Neuro: No weakness, tremor, incoordination, spasms, paresthesia, pain Psychiatric: Denies confusion, memory loss, sensory loss. Denies Depression. Endocrine: Denies change in weight, skin, hair change, nocturia, and paresthesia, diabetic polys,  visual blurring, hyper / hypo glycemic episodes.  Heme/Lymph: No excessive bleeding, bruising, enlarged lymph nodes.  Physical Exam  BP 136/78   Pulse 72   Temp (!) 97.2 F (36.2 C)   Resp 16   Ht 5' 3.5" (1.613 m)   Wt 144 lb (65.3 kg)   BMI 25.11 kg/m   General Appearance: Well nourished, well groomed and in no apparent distress.  Eyes: PERRLA, EOMs, conjunctiva no swelling or erythema, normal fundi and vessels. Sinuses: No frontal/maxillary tenderness ENT/Mouth: EACs patent / TMs  nl. Nares clear without erythema, swelling, mucoid exudates. Oral hygiene is good. No erythema, swelling, or exudate. Tongue normal, non-obstructing. Tonsils not swollen or erythematous. Hearing normal.  Neck: Supple, thyroid not palpable. No bruits, nodes or JVD. Respiratory: Respiratory effort normal.  BS equal and clear bilateral without rales, rhonci, wheezing or stridor. Cardio: Heart sounds are normal with regular rate and rhythm and no murmurs, rubs or gallops. Peripheral pulses are normal and equal bilaterally without edema. No aortic or femoral bruits. Chest: symmetric with normal excursions and percussion. Breasts: Symmetric, without lumps, nipple discharge, retractions, or fibrocystic changes.  Abdomen: Flat, soft with bowel sounds active. Nontender, no guarding, rebound, hernias, masses, or organomegaly.  Lymphatics: Non tender without lymphadenopathy.  Genitourinary:  Musculoskeletal: Full ROM all peripheral extremities, joint stability, 5/5 strength,  and normal gait. Skin: Warm and dry without rashes, lesions, cyanosis, clubbing or  ecchymosis.  Neuro: Cranial nerves intact, reflexes equal bilaterally. Normal muscle tone, no cerebellar symptoms. Sensation intact.  Pysch: Alert and oriented X 3, normal affect, Insight and Judgment appropriate.   Assessment and Plan  1. Annual Preventative Screening Examination  2. Essential hypertension  - EKG 12-Lead - Urinalysis, Routine w reflex  microscopic - Microalbumin / creatinine urine ratio - CBC with Differential/Platelet - COMPLETE METABOLIC PANEL WITH GFR - Magnesium - TSH  3. Hyperlipidemia, mixed  - EKG 12-Lead - Lipid panel - TSH  4. Abnormal glucose  - EKG 12-Lead - Insulin, random - Hemoglobin A1c  5. Vitamin D deficiency  - VITAMIN D 25 Hydroxy  6. Prediabetes  - EKG 12-Lead  7. Gastroesophageal reflux disease   8. CKD (chronic kidney disease) stage 3, GFR 30-59 ml/min (HCC)  - EKG 12-Lead - Urinalysis, Routine w reflex microscopic - Microalbumin / creatinine urine ratio  9. Screening for ischemic heart disease  - EKG 12-Lead  10. FHx: heart disease  - EKG 12-Lead  11. Medication management  - Urinalysis, Routine w reflex microscopic - Microalbumin / creatinine urine ratio - CBC with Differential/Platelet - COMPLETE METABOLIC PANEL WITH GFR - Magnesium - Lipid panel - TSH - Insulin, random - Hemoglobin A1c - VITAMIN D 25 Hydroxyl  12. Screening for colorectal cancer  - POC Hemoccult Bld/Stl            Patient was counseled in prudent diet to achieve/maintain BMI less than 25 for weight control, BP monitoring, regular exercise and medications. Discussed med's effects and SE's. Screening labs and tests as requested with regular follow-up as recommended. Over 40 minutes of exam, counseling, chart review and high complex critical decision making was performed.   Kirtland Bouchard, MD

## 2019-03-08 NOTE — Patient Instructions (Signed)

## 2019-03-09 LAB — COMPLETE METABOLIC PANEL WITH GFR
AG Ratio: 2 (calc) (ref 1.0–2.5)
ALT: 13 U/L (ref 6–29)
AST: 16 U/L (ref 10–35)
Albumin: 4.4 g/dL (ref 3.6–5.1)
Alkaline phosphatase (APISO): 80 U/L (ref 37–153)
BUN: 18 mg/dL (ref 7–25)
CO2: 26 mmol/L (ref 20–32)
Calcium: 10.1 mg/dL (ref 8.6–10.4)
Chloride: 108 mmol/L (ref 98–110)
Creat: 0.68 mg/dL (ref 0.60–0.93)
GFR, Est African American: 98 mL/min/{1.73_m2} (ref >=60)
GFR, Est Non African American: 85 mL/min/{1.73_m2} (ref >=60)
Globulin: 2.2 g/dL (calc) (ref 1.9–3.7)
Glucose, Bld: 101 mg/dL — ABNORMAL HIGH (ref 65–99)
Potassium: 4.7 mmol/L (ref 3.5–5.3)
Sodium: 141 mmol/L (ref 135–146)
Total Bilirubin: 0.4 mg/dL (ref 0.2–1.2)
Total Protein: 6.6 g/dL (ref 6.1–8.1)

## 2019-03-09 LAB — URINALYSIS, ROUTINE W REFLEX MICROSCOPIC
Bilirubin Urine: NEGATIVE
Glucose, UA: NEGATIVE
Hgb urine dipstick: NEGATIVE
Ketones, ur: NEGATIVE
Leukocytes,Ua: NEGATIVE
Nitrite: NEGATIVE
Protein, ur: NEGATIVE
Specific Gravity, Urine: 1.019 (ref 1.001–1.03)
pH: <=5 (ref 5.0–8.0)

## 2019-03-09 LAB — CBC WITH DIFFERENTIAL/PLATELET
Absolute Monocytes: 696 cells/uL (ref 200–950)
Basophils Absolute: 60 cells/uL (ref 0–200)
Basophils Relative: 1 %
Eosinophils Absolute: 150 cells/uL (ref 15–500)
Eosinophils Relative: 2.5 %
HCT: 38.8 % (ref 35.0–45.0)
Hemoglobin: 13.2 g/dL (ref 11.7–15.5)
Lymphs Abs: 1566 cells/uL (ref 850–3900)
MCH: 30.4 pg (ref 27.0–33.0)
MCHC: 34 g/dL (ref 32.0–36.0)
MCV: 89.4 fL (ref 80.0–100.0)
MPV: 9.5 fL (ref 7.5–12.5)
Monocytes Relative: 11.6 %
Neutro Abs: 3528 cells/uL (ref 1500–7800)
Neutrophils Relative %: 58.8 %
Platelets: 294 10*3/uL (ref 140–400)
RBC: 4.34 10*6/uL (ref 3.80–5.10)
RDW: 12.5 % (ref 11.0–15.0)
Total Lymphocyte: 26.1 %
WBC: 6 10*3/uL (ref 3.8–10.8)

## 2019-03-09 LAB — INSULIN, RANDOM: Insulin: 19.7 u[IU]/mL — ABNORMAL HIGH

## 2019-03-09 LAB — VITAMIN D 25 HYDROXY (VIT D DEFICIENCY, FRACTURES): Vit D, 25-Hydroxy: 65 ng/mL (ref 30–100)

## 2019-03-09 LAB — HEMOGLOBIN A1C
Hgb A1c MFr Bld: 5.2 % of total Hgb (ref <5.7)
Mean Plasma Glucose: 103 (calc)
eAG (mmol/L): 5.7 (calc)

## 2019-03-09 LAB — LIPID PANEL
Cholesterol: 178 mg/dL (ref <200)
HDL: 53 mg/dL (ref >=50)
LDL Cholesterol (Calc): 97 mg/dL (calc)
Non-HDL Cholesterol (Calc): 125 mg/dL (calc) (ref <130)
Total CHOL/HDL Ratio: 3.4 (calc) (ref <5.0)
Triglycerides: 181 mg/dL — ABNORMAL HIGH (ref <150)

## 2019-03-09 LAB — MAGNESIUM: Magnesium: 2.1 mg/dL (ref 1.5–2.5)

## 2019-03-09 LAB — MICROALBUMIN / CREATININE URINE RATIO
Creatinine, Urine: 89 mg/dL (ref 20–275)
Microalb Creat Ratio: 7 mcg/mg creat (ref <30)
Microalb, Ur: 0.6 mg/dL

## 2019-03-09 LAB — TSH: TSH: 0.09 mIU/L — ABNORMAL LOW (ref 0.40–4.50)

## 2019-03-23 ENCOUNTER — Other Ambulatory Visit: Payer: Self-pay

## 2019-03-23 DIAGNOSIS — Z1211 Encounter for screening for malignant neoplasm of colon: Secondary | ICD-10-CM

## 2019-03-23 DIAGNOSIS — Z1212 Encounter for screening for malignant neoplasm of rectum: Secondary | ICD-10-CM

## 2019-03-23 LAB — POC HEMOCCULT BLD/STL (HOME/3-CARD/SCREEN)
Card #2 Fecal Occult Blod, POC: NEGATIVE
Card #3 Fecal Occult Blood, POC: NEGATIVE
Fecal Occult Blood, POC: NEGATIVE

## 2019-03-24 DIAGNOSIS — Z1212 Encounter for screening for malignant neoplasm of rectum: Secondary | ICD-10-CM | POA: Diagnosis not present

## 2019-05-18 ENCOUNTER — Ambulatory Visit (INDEPENDENT_AMBULATORY_CARE_PROVIDER_SITE_OTHER): Payer: Medicare Other

## 2019-05-18 ENCOUNTER — Other Ambulatory Visit: Payer: Self-pay

## 2019-05-18 VITALS — HR 94 | Temp 97.5°F | Ht 63.5 in | Wt 146.8 lb

## 2019-05-18 DIAGNOSIS — Z23 Encounter for immunization: Secondary | ICD-10-CM

## 2019-05-19 DIAGNOSIS — Z23 Encounter for immunization: Secondary | ICD-10-CM | POA: Diagnosis not present

## 2019-05-30 ENCOUNTER — Other Ambulatory Visit: Payer: Self-pay | Admitting: Physician Assistant

## 2019-06-07 ENCOUNTER — Encounter: Payer: Self-pay | Admitting: Adult Health

## 2019-06-07 NOTE — Progress Notes (Signed)
FOLLOW UP  Assessment and Plan:   Hypertension Well controlled with current medications  Monitor blood pressure at home; patient to call if consistently greater than 130/80 Continue DASH diet.   Reminder to go to the ER if any CP, SOB, nausea, dizziness, severe HA, changes vision/speech, left arm numbness and tingling and jaw pain.  MVP Monitor, no symptoms;  Cholesterol Currently at goal; continue statin Continue low cholesterol diet and exercise.  Check lipid panel.   Other abnormal glucose Recent A1Cs at goal Discussed diet/exercise, weight management  Defer A1C; check CMP  BMI 25 Continue to recommend diet heavy in fruits and veggies and low in animal meats, cheeses, and dairy products, appropriate calorie intake Discuss exercise recommendations routinely Continue to monitor weight at each visit  Vitamin D Def At goal at last visit; continue supplementation to maintain goal of 60-100 Check Vit D level  GERD Well managed on current medications; failed attempt to taper; continue omeprazole 20 mg daily  Discussed diet, avoiding triggers and other lifestyle changes  Goiter with nodules/hyperthyroid Had neg biopsy in 2004; recent TSH lower than previous; recheck today Denies hyperthyroid symptoms Plan to obtain uptake study depending on TSH results    Continue diet and meds as discussed. Further disposition pending results of labs. Discussed med's effects and SE's.   Over 30 minutes of exam, counseling, chart review, and critical decision making was performed.   Future Appointments  Date Time Provider Ajeenah Heiny  07/28/2019 11:00 AM [facility] DX DEXA 1 [facility]  08/31/2019 10:00 AM Vicie Mutters, PA-C [facility] None  09/07/2019 11:15 AM Unk Pinto, MD [facility] None  04/13/2020 10:00 AM Unk Pinto, MD [facility] None     ----------------------------------------------------------------------------------------------------------------------  HPI 78 y.o. female  presents for 3 month follow up on hypertension, cholesterol, glucose management, weight and vitamin D deficiency.   She has asthma typically well controlled on singulair and PRN albuterol. She is also on zyrtec. She also does daily nasal saline spray with reduced sinusitis episodes.   she has a diagnosis of GERD which is currently managed by omeprazole 20 mg daily, failed attempt to taper with pepcid.  she reports symptoms is currently well controlled, and denies breakthrough reflux, burning in chest, hoarseness or cough.    BMI is Body mass index is 25.28 kg/m., she has been working on diet and exercise (no longer working at [employer], walks a mile daily and does yoga).  Wt Readings from Last 3 Encounters:  06/09/19 145 lb (65.8 kg)  05/18/19 146 lb 12.8 oz (66.6 kg)  03/08/19 144 lb (65.3 kg)   She is on verapmil 120 mg, written for BID but reports has taken once daily for many years, prescribed by cardiology due to mitral prolapse, today their BP is BP: 130/68  She does workout. She denies chest pain, shortness of breath, dizziness.   She is on cholesterol medication (pravastatin 40 mg daily) and denies myalgias. Her cholesterol is at goal. The cholesterol last visit was:   Lab Results  Component Value Date   CHOL 178 03/08/2019   HDL 53 03/08/2019   LDLCALC 97 03/08/2019   TRIG 181 (H) 03/08/2019   CHOLHDL 3.4 03/08/2019    She has been working on diet and exercise for glucose management, and denies foot ulcerations, increased appetite, nausea, paresthesia of the feet, polydipsia, polyuria, visual disturbances, vomiting and weight loss. Last A1C in the office was:  Lab Results  Component Value Date   HGBA1C 5.2 03/08/2019  She has hx of thyroid cyst with "undertermined" results per patient; reviewed chart, no biopsy results available  but per Dr. Idell Pickles notes after that has dx of "benign goiter"; she has had intermittently mildly hyperthyroid results over the last several years until last check was lower than typical:  Lab Results  Component Value Date   TSH 0.09 (L) 03/08/2019  Denies anxiety, insomnia, palpitations, heat intolerance, diarrhea.   Patient is on Vitamin D supplement.   Lab Results  Component Value Date   VD25OH 42 03/08/2019        Current Medications:  Current Outpatient Medications on File Prior to Visit  Medication Sig  . albuterol (VENTOLIN HFA) 108 (90 Base) MCG/ACT inhaler Use 2 Inhalations 15 minutes Apart every 4 Hours to Rescue Asthma  . aspirin 81 MG chewable tablet Chew 81 mg by mouth daily.  . Calcium Carb-Cholecalciferol 1000-800 MG-UNIT TABS Take by mouth.  . cetirizine (ZYRTEC) 10 MG tablet Take 10 mg by mouth daily.  . Cholecalciferol (VITAMIN D) 2000 UNITS CAPS Take by mouth.  . cyanocobalamin 500 MCG tablet Take 500 mcg by mouth daily.  . diclofenac sodium (VOLTAREN) 1 % GEL APPLY 2 GRAMS TO AFFECTED AREA FOUR TIMES DAILY  . montelukast (SINGULAIR) 10 MG tablet Take 1 tablet Daily for Allergies  . Multiple Vitamins-Minerals (MULTIVITAMIN PO) Take by mouth daily.  . Omega-3 Fatty Acids (FISH OIL PO) Take by mouth daily.  Marland Kitchen omeprazole (PRILOSEC) 20 MG capsule Take 1 capsule Daily for Acid Indigestion & Reflux  . pravastatin (PRAVACHOL) 40 MG tablet TAKE 1 TABLET BY MOUTH EVERY DAY AS DIRECTED FOR CHOLESTEROL  . Probiotic Product (PROBIOTIC DAILY PO) Take by mouth daily.  . verapamil (CALAN) 120 MG tablet take 1 tablet by mouth twice a day (Patient taking differently: Take one tablet once daily)   No current facility-administered medications on file prior to visit.      Allergies:  Allergies  Allergen Reactions  . Adhesive [Tape] Hives  . Codeine Hives  . Fosamax [Alendronate Sodium] Other (See Comments)    Reflux increase  . Penicillins Hives  . Shellfish Allergy   .  Iodine Rash     Medical History:  Past Medical History:  Diagnosis Date  . Anemia   . Asthma   . GERD (gastroesophageal reflux disease)   . Goiter   . Hyperlipidemia   . Hypertension   . MVP (mitral valve prolapse)   . Osteopenia   . Other abnormal glucose   . Positive colorectal cancer screening using Cologuard test 08/11/2017   Family history- Reviewed and unchanged Social history- Reviewed and unchanged   Review of Systems:  Review of Systems  Constitutional: Negative for malaise/fatigue and weight loss.  HENT: Negative for hearing loss and tinnitus.   Eyes: Negative for blurred vision and double vision.  Respiratory: Negative for cough, shortness of breath and wheezing.   Cardiovascular: Negative for chest pain, palpitations, orthopnea, claudication and leg swelling.  Gastrointestinal: Negative for abdominal pain, blood in stool, constipation, diarrhea, heartburn, melena, nausea and vomiting.  Genitourinary: Negative.   Musculoskeletal: Negative for joint pain and myalgias.  Skin: Negative for rash.  Neurological: Negative for dizziness, tingling, sensory change, weakness and headaches.  Endo/Heme/Allergies: Negative for polydipsia.  Psychiatric/Behavioral: Negative.   All other systems reviewed and are negative.    Physical Exam: BP 130/68   Pulse 77   Temp (!) 97.3 F (36.3 C)   Ht 5' 3.5" (1.613 m)   Wt 145 lb (  65.8 kg)   SpO2 98%   BMI 25.28 kg/m  Wt Readings from Last 3 Encounters:  06/09/19 145 lb (65.8 kg)  05/18/19 146 lb 12.8 oz (66.6 kg)  03/08/19 144 lb (65.3 kg)   General Appearance: Well nourished, in no apparent distress. Eyes: PERRLA, EOMs, conjunctiva no swelling or erythema Sinuses: No Frontal/maxillary tenderness ENT/Mouth: Ext aud canals clear, TMs without erythema, bulging. No erythema, swelling, or exudate on post pharynx.  Tonsils not swollen or erythematous. Hearing normal.  Neck: Supple, thyroid not notably enlarged, no distinct  palpable nodules Respiratory: Respiratory effort normal, BS equal bilaterally without rales, rhonchi, wheezing or stridor.  Cardio: RRR with mild 2/6 systolic murmur. Brisk peripheral pulses without edema.  Abdomen: Soft, + BS.  Non tender, no guarding, rebound, hernias, masses. Lymphatics: Non tender without lymphadenopathy.  Musculoskeletal: Full ROM, 5/5 strength, Normal gait Skin: Warm, dry without rashes, lesions, ecchymosis. Distal nails mildly brittle without cracks Neuro: Cranial nerves intact. No cerebellar symptoms.  Psych: Awake and oriented X 3, normal affect, Insight and Judgment appropriate.    Izora Ribas, NP 11:23 AM [facility]

## 2019-06-09 ENCOUNTER — Encounter: Payer: Self-pay | Admitting: Adult Health

## 2019-06-09 ENCOUNTER — Ambulatory Visit (INDEPENDENT_AMBULATORY_CARE_PROVIDER_SITE_OTHER): Payer: Medicare Other | Admitting: Adult Health

## 2019-06-09 ENCOUNTER — Other Ambulatory Visit: Payer: Self-pay

## 2019-06-09 VITALS — BP 130/68 | HR 77 | Temp 97.3°F | Ht 63.5 in | Wt 145.0 lb

## 2019-06-09 DIAGNOSIS — Z79899 Other long term (current) drug therapy: Secondary | ICD-10-CM

## 2019-06-09 DIAGNOSIS — R7309 Other abnormal glucose: Secondary | ICD-10-CM | POA: Diagnosis not present

## 2019-06-09 DIAGNOSIS — Z6825 Body mass index (BMI) 25.0-25.9, adult: Secondary | ICD-10-CM

## 2019-06-09 DIAGNOSIS — I341 Nonrheumatic mitral (valve) prolapse: Secondary | ICD-10-CM | POA: Diagnosis not present

## 2019-06-09 DIAGNOSIS — I10 Essential (primary) hypertension: Secondary | ICD-10-CM | POA: Diagnosis not present

## 2019-06-09 DIAGNOSIS — E0590 Thyrotoxicosis, unspecified without thyrotoxic crisis or storm: Secondary | ICD-10-CM

## 2019-06-09 DIAGNOSIS — J45909 Unspecified asthma, uncomplicated: Secondary | ICD-10-CM | POA: Diagnosis not present

## 2019-06-09 DIAGNOSIS — K219 Gastro-esophageal reflux disease without esophagitis: Secondary | ICD-10-CM

## 2019-06-09 DIAGNOSIS — E559 Vitamin D deficiency, unspecified: Secondary | ICD-10-CM

## 2019-06-09 DIAGNOSIS — E785 Hyperlipidemia, unspecified: Secondary | ICD-10-CM

## 2019-06-09 MED ORDER — VERAPAMIL HCL ER 120 MG PO TBCR: 120.0000 mg | EXTENDED_RELEASE_TABLET | Freq: Every day | ORAL | 1 refills | Status: DC

## 2019-06-09 NOTE — Patient Instructions (Addendum)
Monitor progress in nails as they grow out  Consider a hair/skin nails supplement    Hyperthyroidism  Hyperthyroidism is when the thyroid gland is too active (overactive). The thyroid gland is a small gland located in the lower front part of the neck, just in front of the windpipe (trachea). This gland makes hormones that help control how the body uses food for energy (metabolism) as well as how the heart and brain function. These hormones also play a role in keeping your bones strong. When the thyroid is overactive, it produces too much of a hormone called thyroxine. What are the causes? This condition may be caused by:  Graves' disease. This is a disorder in which the body's disease-fighting system (immune system) attacks the thyroid gland. This is the most common cause.  Inflammation of the thyroid gland.  A tumor in the thyroid gland.  Use of certain medicines, including: ? Prescription thyroid hormone replacement. ? Herbal supplements that mimic thyroid hormones. ? Amiodarone therapy.  Solid or fluid-filled lumps within your thyroid gland (thyroid nodules).  Taking in a large amount of iodine from foods or medicines. What increases the risk? You are more likely to develop this condition if:  You are female.  You have a family history of thyroid conditions.  You smoke tobacco.  You use a medicine called lithium.  You take medicines that affect the immune system (immunosuppressants). What are the signs or symptoms? Symptoms of this condition include:  Nervousness.  Inability to tolerate heat.  Unexplained weight loss.  Diarrhea.  Change in the texture of hair or skin.  Heart skipping beats or making extra beats.  Rapid heart rate.  Loss of menstruation.  Shaky hands.  Fatigue.  Restlessness.  Sleep problems.  Enlarged thyroid gland or a lump in the thyroid (nodule). You may also have symptoms of Graves' disease, which may include:  Protruding  eyes.  Dry eyes.  Red or swollen eyes.  Problems with vision. How is this diagnosed? This condition may be diagnosed based on:  Your symptoms and medical history.  A physical exam.  Blood tests.  Thyroid ultrasound. This test involves using sound waves to produce images of the thyroid gland.  A thyroid scan. A radioactive substance is injected into a vein, and images show how much iodine is present in the thyroid.  Radioactive iodine uptake test (RAIU). A small amount of radioactive iodine is given by mouth to see how much iodine the thyroid absorbs after a certain amount of time. How is this treated? Treatment depends on the cause and severity of the condition. Treatment may include:  Medicines to reduce the amount of thyroid hormone your body makes.  Radioactive iodine treatment (radioiodine therapy). This involves swallowing a small dose of radioactive iodine, in capsule or liquid form, to kill thyroid cells.  Surgery to remove part or all of your thyroid gland. You may need to take thyroid hormone replacement medicine for the rest of your life after thyroid surgery.  Medicines to help manage your symptoms. Follow these instructions at home:   Take over-the-counter and prescription medicines only as told by your health care provider.  Do not use any products that contain nicotine or tobacco, such as cigarettes and e-cigarettes. If you need help quitting, ask your health care provider.  Follow any instructions from your health care provider about diet. You may be instructed to limit foods that contain iodine.  Keep all follow-up visits as told by your health care provider. This is  important. ? You will need to have blood tests regularly so that your health care provider can monitor your condition. Contact a health care provider if:  Your symptoms do not get better with treatment.  You have a fever.  You are taking thyroid hormone replacement medicine and you: ? Have  symptoms of depression. ? Feel like you are tired all the time. ? Gain weight. Get help right away if:  You have chest pain.  You have decreased alertness or a change in your awareness.  You have abdominal pain.  You feel dizzy.  You have a rapid heartbeat.  You have an irregular heartbeat.  You have difficulty breathing. Summary  The thyroid gland is a small gland located in the lower front part of the neck, just in front of the windpipe (trachea).  Hyperthyroidism is when the thyroid gland is too active (overactive) and produces too much of a hormone called thyroxine.  The most common cause is Graves' disease, a disorder in which your immune system attacks the thyroid gland.  Hyperthyroidism can cause various symptoms, such as unexplained weight loss, nervousness, inability to tolerate heat, or changes in your heartbeat.  Treatment may include medicine to reduce the amount of thyroid hormone your body makes, radioiodine therapy, surgery, or medicines to manage symptoms. This information is not intended to replace advice given to you by your health care provider. Make sure you discuss any questions you have with your health care provider. Document Released: 08/26/2005 Document Revised: 08/08/2017 Document Reviewed: 08/06/2017 Elsevier Patient Education  2020 [employer].

## 2019-06-10 ENCOUNTER — Other Ambulatory Visit: Payer: Self-pay | Admitting: Adult Health

## 2019-06-10 DIAGNOSIS — R7989 Other specified abnormal findings of blood chemistry: Secondary | ICD-10-CM

## 2019-06-10 DIAGNOSIS — E042 Nontoxic multinodular goiter: Secondary | ICD-10-CM

## 2019-06-10 LAB — CBC WITH DIFFERENTIAL/PLATELET
Absolute Monocytes: 656 cells/uL (ref 200–950)
Basophils Absolute: 51 cells/uL (ref 0–200)
Basophils Relative: 0.9 %
Eosinophils Absolute: 120 cells/uL (ref 15–500)
Eosinophils Relative: 2.1 %
HCT: 41.3 % (ref 35.0–45.0)
Hemoglobin: 13.7 g/dL (ref 11.7–15.5)
Lymphs Abs: 1721 cells/uL (ref 850–3900)
MCH: 30 pg (ref 27.0–33.0)
MCHC: 33.2 g/dL (ref 32.0–36.0)
MCV: 90.6 fL (ref 80.0–100.0)
MPV: 9.6 fL (ref 7.5–12.5)
Monocytes Relative: 11.5 %
Neutro Abs: 3152 cells/uL (ref 1500–7800)
Neutrophils Relative %: 55.3 %
Platelets: 302 10*3/uL (ref 140–400)
RBC: 4.56 10*6/uL (ref 3.80–5.10)
RDW: 12.4 % (ref 11.0–15.0)
Total Lymphocyte: 30.2 %
WBC: 5.7 10*3/uL (ref 3.8–10.8)

## 2019-06-10 LAB — COMPLETE METABOLIC PANEL WITH GFR
AG Ratio: 1.8 (calc) (ref 1.0–2.5)
ALT: 13 U/L (ref 6–29)
AST: 19 U/L (ref 10–35)
Albumin: 4.4 g/dL (ref 3.6–5.1)
Alkaline phosphatase (APISO): 75 U/L (ref 37–153)
BUN: 17 mg/dL (ref 7–25)
CO2: 26 mmol/L (ref 20–32)
Calcium: 10.1 mg/dL (ref 8.6–10.4)
Chloride: 106 mmol/L (ref 98–110)
Creat: 0.72 mg/dL (ref 0.60–0.93)
GFR, Est African American: 94 mL/min/{1.73_m2} (ref >=60)
GFR, Est Non African American: 81 mL/min/{1.73_m2} (ref >=60)
Globulin: 2.5 g/dL (calc) (ref 1.9–3.7)
Glucose, Bld: 102 mg/dL — ABNORMAL HIGH (ref 65–99)
Potassium: 4.6 mmol/L (ref 3.5–5.3)
Sodium: 140 mmol/L (ref 135–146)
Total Bilirubin: 0.4 mg/dL (ref 0.2–1.2)
Total Protein: 6.9 g/dL (ref 6.1–8.1)

## 2019-06-10 LAB — TSH: TSH: 0.19 mIU/L — ABNORMAL LOW (ref 0.40–4.50)

## 2019-06-10 LAB — MAGNESIUM: Magnesium: 2.2 mg/dL (ref 1.5–2.5)

## 2019-06-10 LAB — LIPID PANEL
Cholesterol: 200 mg/dL — ABNORMAL HIGH (ref <200)
HDL: 56 mg/dL (ref >=50)
LDL Cholesterol (Calc): 113 mg/dL (calc) — ABNORMAL HIGH
Non-HDL Cholesterol (Calc): 144 mg/dL (calc) — ABNORMAL HIGH (ref <130)
Total CHOL/HDL Ratio: 3.6 (calc) (ref <5.0)
Triglycerides: 184 mg/dL — ABNORMAL HIGH (ref <150)

## 2019-06-23 ENCOUNTER — Other Ambulatory Visit: Payer: Medicare Other

## 2019-06-28 ENCOUNTER — Other Ambulatory Visit: Payer: Self-pay

## 2019-06-28 ENCOUNTER — Encounter (HOSPITAL_COMMUNITY)
Admission: RE | Admit: 2019-06-28 | Discharge: 2019-06-28 | Disposition: A | Discharge status: Home / Self Care | Payer: Medicare Other | Source: Ambulatory Visit | Attending: Adult Health | Admitting: Adult Health

## 2019-06-28 DIAGNOSIS — R7989 Other specified abnormal findings of blood chemistry: Secondary | ICD-10-CM | POA: Diagnosis not present

## 2019-06-28 DIAGNOSIS — E042 Nontoxic multinodular goiter: Secondary | ICD-10-CM | POA: Diagnosis not present

## 2019-06-28 MED ORDER — SODIUM IODIDE I-123 7.4 MBQ CAPS
420.3000 | ORAL_CAPSULE | Freq: Once | ORAL | Status: AC
  Administered 2019-06-28: 420.3 via ORAL

## 2019-06-29 ENCOUNTER — Encounter (HOSPITAL_COMMUNITY)
Admission: RE | Admit: 2019-06-29 | Discharge: 2019-06-29 | Disposition: A | Discharge status: Home / Self Care | Payer: Medicare Other | Source: Ambulatory Visit | Attending: Adult Health | Admitting: Adult Health

## 2019-06-30 DIAGNOSIS — E042 Nontoxic multinodular goiter: Secondary | ICD-10-CM | POA: Diagnosis not present

## 2019-07-01 ENCOUNTER — Other Ambulatory Visit: Payer: Self-pay | Admitting: *Deleted

## 2019-07-01 DIAGNOSIS — K219 Gastro-esophageal reflux disease without esophagitis: Secondary | ICD-10-CM

## 2019-07-01 MED ORDER — OMEPRAZOLE 20 MG PO CPDR: DELAYED_RELEASE_CAPSULE | ORAL | 0 refills | Status: DC

## 2019-07-28 ENCOUNTER — Other Ambulatory Visit: Payer: Self-pay | Admitting: Physician Assistant

## 2019-07-28 ENCOUNTER — Other Ambulatory Visit: Payer: Medicare Other

## 2019-07-28 DIAGNOSIS — Z1231 Encounter for screening mammogram for malignant neoplasm of breast: Secondary | ICD-10-CM

## 2019-07-29 ENCOUNTER — Other Ambulatory Visit: Payer: Self-pay | Admitting: *Deleted

## 2019-07-29 ENCOUNTER — Other Ambulatory Visit: Payer: Self-pay | Admitting: Internal Medicine

## 2019-07-29 DIAGNOSIS — K219 Gastro-esophageal reflux disease without esophagitis: Secondary | ICD-10-CM

## 2019-07-29 MED ORDER — OMEPRAZOLE 20 MG PO CPDR: DELAYED_RELEASE_CAPSULE | ORAL | 0 refills | Status: DC

## 2019-08-29 NOTE — Progress Notes (Signed)
Medicare wellness and OV  Assessment:   Essential hypertension - continue medications, DASH diet, exercise and monitor at home. Call if greater than 130/80.   Hyperlipidemia, unspecified hyperlipidemia type -continue medications, check lipids, decrease fatty foods, increase activity.   Prediabetes Discussed disease progression and risks Discussed diet/exercise, weight management and risk modification  Medication management -     Magnesium  Anemia, unspecified type - monitor  Gastroesophageal reflux disease, esophagitis presence not specified Continue PPI/H2 blocker, diet discussed  Uncomplicated asthma, unspecified asthma severity, unspecified whether persistent Monitor, controlled  MVP (mitral valve prolapse) Monitor, no symptoms  Vitamin D deficiency Continue supplement  Encounter for Medicare annual wellness exam 1 year  BMI 23.0-23.9, adult Monitor  Future Appointments  Date Time Provider [place]  10/25/2019 10:30 AM [facility] DX DEXA 1 [facility]  10/25/2019 11:00 AM [facility] MM 2 [facility]-BCGMM [facility]  12/30/2019 10:30 AM Vicie Mutters, PA-C [facility] None  04/13/2020 10:00 AM Unk Pinto, MD [facility] None  08/30/2020 11:15 AM Vicie Mutters, PA-C [facility] None     Plan:   During the course of the visit the patient was educated and counseled about appropriate screening and preventive services including:    Pneumococcal vaccine   Influenza vaccine  Td vaccine  Screening electrocardiogram  Screening mammography  Bone densitometry screening  Colorectal cancer screening  Diabetes screening  Glaucoma screening  Nutrition counseling   Subjective:   Isabel Oliver is a  78 y.o. WMF who presents for Medicare Annual Wellness Visit and follow up for chronic illness including HTN, chol, preDM, vitamin D def, osteoporosis.    Had + cologuard with subsequent colonoscopy 04/2018, benign polyp, 5 years with Dr. Oletta Lamas.  She  states 2 nights a month she will have some issues with sleeping, and she will not be able to stay asleep.    Her blood pressure has been controlled at home, today their BP is BP: 126/88 She does workout, since she is not longer working, she is walking about a mile in her neighborhood and does some yoga. She denies chest pain, shortness of breath, dizziness. She has asthma, does inhaler once every day in the AM.   She is on cholesterol medication and denies myalgias. Her cholesterol is not at goal. The cholesterol last visit was:   Lab Results  Component Value Date   CHOL 200 (H) 06/09/2019   HDL 56 06/09/2019   LDLCALC 113 (H) 06/09/2019   TRIG 184 (H) 06/09/2019   CHOLHDL 3.6 06/09/2019    Last A1C in the office was:  Lab Results  Component Value Date   HGBA1C 5.2 03/08/2019   Patient is on Vitamin D supplement, 2000 international units she thinks..   Lab Results  Component Value Date   VD25OH 65 03/08/2019   She has history of osteoporosis, could not tolerate calcitonin or fosamax due to her GERD.  BMI is Body mass index is 26.26 kg/m. weight is up some, contributes it to the holidays. Wt Readings from Last 3 Encounters:  08/31/19 150 lb 9.6 oz (68.3 kg)  06/09/19 145 lb (65.8 kg)  05/18/19 146 lb 12.8 oz (66.6 kg)     Names of Other Physician/Practitioners you currently use: 1. [facility]- here for primary care 2. Dr. Katy Fitch, eye doctor, 06/2018 3. Dr Baldomero Lamy. In [place], dentist, last visit q 6 months Patient Care Team: Unk Pinto, MD as PCP - General (Internal Medicine) Laurence Spates, MD as Consulting Physician (Gastroenterology) Harrington Challenger,  [place], MD (Inactive) as Consulting Physician (Obstetrics and Gynecology) Danella Sensing, MD as Consulting Physician (Dermatology)   Medication Review Current Outpatient Medications on File Prior to Visit  Medication Sig Dispense Refill  . albuterol (VENTOLIN HFA) 108 (90 Base)  MCG/ACT inhaler Use 2 Inhalations 15 minutes Apart every 4 Hours to Rescue Asthma 48 g 1  . aspirin 81 MG chewable tablet Chew 81 mg by mouth daily.    . Calcium Carb-Cholecalciferol 1000-800 MG-UNIT TABS Take by mouth.    . cetirizine (ZYRTEC) 10 MG tablet Take 10 mg by mouth daily.    . Cholecalciferol (VITAMIN D) 2000 UNITS CAPS Take by mouth.    . cyanocobalamin 500 MCG tablet Take 500 mcg by mouth daily.    . diclofenac sodium (VOLTAREN) 1 % GEL APPLY 2 GRAMS TO AFFECTED AREA FOUR TIMES DAILY 100 g 0  . montelukast (SINGULAIR) 10 MG tablet Take 1 tablet Daily for Allergies 90 tablet 3  . Multiple Vitamins-Minerals (MULTIVITAMIN PO) Take by mouth daily.    . Omega-3 Fatty Acids (FISH OIL PO) Take by mouth daily.    Marland Kitchen omeprazole (PRILOSEC) 20 MG capsule Take 1 capsule Daily for Acid Indigestion & Reflux 90 capsule 0  . pravastatin (PRAVACHOL) 40 MG tablet Take 1 tablet at Bedtime for Cholesterol 90 tablet 3  . Probiotic Product (PROBIOTIC DAILY PO) Take by mouth daily.     No current facility-administered medications on file prior to visit.    Current Problems (verified) Patient Active Problem List   Diagnosis Date Noted  . Hyperthyroidism 06/09/2019  . Encounter for Medicare annual wellness exam 03/29/2015  . Medication management 12/05/2014  . Vitamin D deficiency 12/05/2014  . Other abnormal glucose   . MVP (mitral valve prolapse)   . Hypertension   . Hyperlipidemia   . GERD (gastroesophageal reflux disease)   . Asthma     Screening Tests Immunization History  Administered Date(s) Administered  . Influenza, High Dose Seasonal PF 07/14/2014, 05/21/2016, 06/20/2017, 05/19/2019  . Influenza-Unspecified 06/28/2013, 07/06/2015  . Pneumococcal Conjugate-13 02/01/2014  . Pneumococcal-Unspecified 09/10/2007  . Tdap 12/05/2009  . Zoster 09/09/2005   Preventative care: Last colonoscopy: 04/2018  Last mammogram: 09/2018 CAT B- getting in FEB with DEXA Last pap smear/pelvic  exam: 2011 remote DEXA: 07/16/2015+ will get next year osteoporsis- on D 3 and calcium  Prior vaccinations: TD or Tdap: 2011  Influenza: 2020  Pneumococcal: 2009 Prevnar 13: 2015 Shingles/Zostavax: 2007- suggest new vaccine  Allergies Allergies  Allergen Reactions  . Adhesive [Tape] Hives  . Codeine Hives  . Fosamax [Alendronate Sodium] Other (See Comments)    Reflux increase  . Penicillins Hives  . Shellfish Allergy   . Iodine Rash    SURGICAL HISTORY She  has a past surgical history that includes Appendectomy; Cholecystectomy; Eye surgery; and Tonsillectomy and adenoidectomy. FAMILY HISTORY Her family history includes Diabetes in her father; Heart disease in her father; Hyperlipidemia in her brother and sister; Hypertension in her mother. SOCIAL HISTORY She  reports that she has never smoked. She has never used smokeless tobacco. She reports that she does not drink alcohol or use drugs.  MEDICARE WELLNESS OBJECTIVES: Physical activity: Current Exercise Habits: Home exercise routine, Type of exercise: walking, Time (Minutes): 30, Frequency (Times/Week): 5, Weekly Exercise (Minutes/Week): 150, Intensity: Mild Cardiac risk factors: Cardiac Risk Factors include: advanced age (>65men, >46 women);dyslipidemia;hypertension Depression/mood screen:   Depression screen [facility] 2/9 03/08/2019  Decreased Interest 0  Down, Depressed, Hopeless 0  PHQ - 2  Score 0    ADLs:  In your present state of health, do you have any difficulty performing the following activities: 08/31/2019 03/08/2019  Hearing? N N  Vision? N N  Difficulty concentrating or making decisions? N N  Walking or climbing stairs? N N  Dressing or bathing? N N  Doing errands, shopping? N N  Some recent data might be hidden     Cognitive Testing  Alert? Yes  Normal Appearance?Yes  Oriented to person? Yes  Place? Yes   Time? Yes  Recall of three objects?  Yes  Can perform simple calculations? Yes  Displays appropriate  judgment?Yes  Can read the correct time from a watch face?Yes  EOL planning: Does Patient Have a Medical Advance Directive?: Yes Does patient want to make changes to medical advance directive?: No - Patient declined   Objective:     Blood pressure 126/88, pulse 66, temperature (!) 97.5 F (36.4 C), height 5' 3.5" (1.613 m), weight 150 lb 9.6 oz (68.3 kg), SpO2 95 %. Body mass index is 26.26 kg/m.  General appearance: alert, no distress, WD/WN,  female HEENT: normocephalic, sclerae anicteric, TMs pearly, nares patent, no discharge or erythema, pharynx normal Oral cavity: MMM, no lesions Neck: supple, no lymphadenopathy, no thyromegaly, no masses Heart: RRR, normal S1, S2, no murmurs Lungs: CTA bilaterally, no wheezes, rhonchi, or rales Abdomen: +bs, soft, non tender, non distended, no masses, no hepatomegaly, no splenomegaly Musculoskeletal: nontender, no swelling, no obvious deformity Extremities: no edema, no cyanosis, no clubbing Pulses: 2+ symmetric, upper and lower extremities, normal cap refill Neurological: alert, oriented x 3, CN2-12 intact, strength normal upper extremities and lower extremities, sensation normal throughout, DTRs 2+ throughout, no cerebellar signs, gait normal Psychiatric: normal affect, behavior normal, pleasant  Breast: defer Gyn: defer Rectal: defer  Medicare Attestation I have personally reviewed: The patient's medical and social history Their use of alcohol, tobacco or illicit drugs Their current medications and supplements The patient's functional ability including ADLs,fall risks, home safety risks, cognitive, and hearing and visual impairment Diet and physical activities Evidence for depression or mood disorders  The patient's weight, height, BMI, and visual acuity have been recorded in the chart.  I have made referrals, counseling, and provided education to the patient based on review of the above and I have provided the patient with a written  personalized care plan for preventive services.     Vicie Mutters, PA-C   08/31/2019

## 2019-08-31 ENCOUNTER — Encounter: Payer: Self-pay | Admitting: Physician Assistant

## 2019-08-31 ENCOUNTER — Ambulatory Visit (INDEPENDENT_AMBULATORY_CARE_PROVIDER_SITE_OTHER): Payer: Medicare Other | Admitting: Physician Assistant

## 2019-08-31 ENCOUNTER — Other Ambulatory Visit: Payer: Self-pay

## 2019-08-31 VITALS — BP 126/88 | HR 66 | Temp 97.5°F | Ht 63.5 in | Wt 150.6 lb

## 2019-08-31 DIAGNOSIS — J45909 Unspecified asthma, uncomplicated: Secondary | ICD-10-CM

## 2019-08-31 DIAGNOSIS — Z0000 Encounter for general adult medical examination without abnormal findings: Secondary | ICD-10-CM

## 2019-08-31 DIAGNOSIS — Z0001 Encounter for general adult medical examination with abnormal findings: Secondary | ICD-10-CM | POA: Diagnosis not present

## 2019-08-31 DIAGNOSIS — E785 Hyperlipidemia, unspecified: Secondary | ICD-10-CM

## 2019-08-31 DIAGNOSIS — R6889 Other general symptoms and signs: Secondary | ICD-10-CM | POA: Diagnosis not present

## 2019-08-31 DIAGNOSIS — I341 Nonrheumatic mitral (valve) prolapse: Secondary | ICD-10-CM

## 2019-08-31 DIAGNOSIS — E0590 Thyrotoxicosis, unspecified without thyrotoxic crisis or storm: Secondary | ICD-10-CM

## 2019-08-31 DIAGNOSIS — R7309 Other abnormal glucose: Secondary | ICD-10-CM

## 2019-08-31 DIAGNOSIS — E559 Vitamin D deficiency, unspecified: Secondary | ICD-10-CM

## 2019-08-31 DIAGNOSIS — K219 Gastro-esophageal reflux disease without esophagitis: Secondary | ICD-10-CM

## 2019-08-31 DIAGNOSIS — I10 Essential (primary) hypertension: Secondary | ICD-10-CM | POA: Diagnosis not present

## 2019-08-31 DIAGNOSIS — Z79899 Other long term (current) drug therapy: Secondary | ICD-10-CM

## 2019-08-31 MED ORDER — VERAPAMIL HCL ER 120 MG PO TBCR: 120.0000 mg | EXTENDED_RELEASE_TABLET | Freq: Every day | ORAL | 1 refills | Status: DC

## 2019-08-31 NOTE — Patient Instructions (Signed)
Belsomra is a prescription for insomnia. It targets orexin, a neurotransmitter that signals your brain to be awake, it quiets this action allowing you to sleep.   It is important to give Belsomra an adequate trail period. Try Belsomra a few nights to a week and see how it works for you. It comes in multiple strengths so if you are not sleeping well on the 10mg , we can increase your dose of Belsomra to 15 or 20 mg.  Do not take more than 20 mg of Belsomra in 1 day.   Take Belsomra 30 mins before going to bed.  Can try magnesium at night Room is cold, dark, and have a fan or noise machine going    How should I use this medicine? Take this medicine by mouth within 30 minutes of going to bed. Do not take it unless you are able to stay in bed a full night before you must be active again. Follow the directions on the prescription label. For best results, it is better to take this medicine on an empty stomach. Do not take your medicine more often than directed. Do not stop taking this medicine on your own. Always follow your doctor or health care professional's advice. What if I miss a dose? This medicine should only be taken immediately before going to sleep. Do not take double or extra doses. What should I watch for while using this medicine? Visit your doctor or health care professional for regular checks on your progress. Keep a regular sleep schedule by going to bed at about the same time each night. Avoid caffeine-containing drinks in the evening hours. When sleep medicines are used every night for more than a few weeks, they may stop working. Do not increase the dose on your own. Talk to your doctor if your insomnia worsens or is not better within 7 to 10 days. You may not be able to remember things that you do in the hours after you take this medicine. Some people have reported driving, making phone calls, or preparing and eating food while asleep after taking sleep medicine. Take this medicine  right before going to sleep. Tell your doctor if you are having any problems with your memory. Do not take this medicine unless you are able to stay in bed for a full night (7 to 8 hours) before you must be active again and do not drive or perform other activities requiring full alertness within 8 hours of a dose. Do not drive, use machinery, or do anything that needs mental alertness the day after you take the 20 mg dose of this medicine. The use of lower doses (10 mg) also has the potential to cause driving impairment the next day. You may have a decrease in mental alertness the day after use, even if you feel that you are fully awake. Tell your doctor if you will need to perform activities requiring full alertness, such as driving, the next day. You may get drowsy or dizzy. Do not stand or sit up quickly, especially if you are an older patient. This reduces the risk of dizzy or fainting spells. Alcohol may interfere with the effect of this medicine. Avoid alcoholic drinks. Do not use this medicine if you have had alcohol that evening or before bed. If you or your family notice any changes in your behavior, or if you have any unusual or disturbing thoughts such as depression or suicidal thoughts, call your doctor right away.  Also please remember it is  important to still have good sleep hygiene. Here is some information below.   Using relaxation techniques that help with breathing and reduce muscle tension.  Exercising earlier in the day.  Changing your diet and the time of your last meal. No night time snacks.  Establish a regular time to go to bed.  Counseling can help with stressful problems and worry.  Soothing music and white noise may be helpful if there are background noises you cannot remove.  Stop tedious detailed work at least one hour before bedtime.  Get out of bed if you are still awake after 15 minutes. Read or do some quiet activity. Keep the lights down. Wait until you feel  sleepy and go back to bed.  Keep regular sleeping and waking hours. Avoid naps.  Exercise regularly.  Avoid distractions at bedtime. Distractions include watching television or engaging in any intense or detailed activity like attempting to balance the household checkbook.  Develop a bedtime ritual. Keep a familiar routine of bathing, brushing your teeth, climbing into bed at the same time each night, listening to soothing music. Routines increase the success of falling to sleep faster.  Use relaxation techniques. This can be using breathing and muscle tension release routines. It can also include visualizing peaceful scenes. You can also help control troubling or intruding thoughts by keeping your mind occupied with boring or repetitive thoughts like the old concept of counting sheep. You can make it more creative like imagining planting one beautiful flower after another in your backyard garden.  During your day, work to eliminate stress. When this is not possible use some of the previous suggestions to help reduce the anxiety that accompanies stressful situations.

## 2019-09-01 LAB — LIPID PANEL
Cholesterol: 168 mg/dL (ref <200)
HDL: 52 mg/dL (ref >=50)
LDL Cholesterol (Calc): 90 mg/dL (calc)
Non-HDL Cholesterol (Calc): 116 mg/dL (calc) (ref <130)
Total CHOL/HDL Ratio: 3.2 (calc) (ref <5.0)
Triglycerides: 160 mg/dL — ABNORMAL HIGH (ref <150)

## 2019-09-01 LAB — COMPLETE METABOLIC PANEL WITH GFR
AG Ratio: 1.7 (calc) (ref 1.0–2.5)
ALT: 13 U/L (ref 6–29)
AST: 16 U/L (ref 10–35)
Albumin: 4.1 g/dL (ref 3.6–5.1)
Alkaline phosphatase (APISO): 75 U/L (ref 37–153)
BUN: 19 mg/dL (ref 7–25)
CO2: 26 mmol/L (ref 20–32)
Calcium: 9.6 mg/dL (ref 8.6–10.4)
Chloride: 107 mmol/L (ref 98–110)
Creat: 0.76 mg/dL (ref 0.60–0.93)
GFR, Est African American: 88 mL/min/{1.73_m2} (ref >=60)
GFR, Est Non African American: 76 mL/min/{1.73_m2} (ref >=60)
Globulin: 2.4 g/dL (calc) (ref 1.9–3.7)
Glucose, Bld: 90 mg/dL (ref 65–99)
Potassium: 4.9 mmol/L (ref 3.5–5.3)
Sodium: 141 mmol/L (ref 135–146)
Total Bilirubin: 0.3 mg/dL (ref 0.2–1.2)
Total Protein: 6.5 g/dL (ref 6.1–8.1)

## 2019-09-01 LAB — CBC WITH DIFFERENTIAL/PLATELET
Absolute Monocytes: 630 cells/uL (ref 200–950)
Basophils Absolute: 42 cells/uL (ref 0–200)
Basophils Relative: 0.7 %
Eosinophils Absolute: 138 cells/uL (ref 15–500)
Eosinophils Relative: 2.3 %
HCT: 37.6 % (ref 35.0–45.0)
Hemoglobin: 12.4 g/dL (ref 11.7–15.5)
Lymphs Abs: 1548 cells/uL (ref 850–3900)
MCH: 29.6 pg (ref 27.0–33.0)
MCHC: 33 g/dL (ref 32.0–36.0)
MCV: 89.7 fL (ref 80.0–100.0)
MPV: 9.3 fL (ref 7.5–12.5)
Monocytes Relative: 10.5 %
Neutro Abs: 3642 cells/uL (ref 1500–7800)
Neutrophils Relative %: 60.7 %
Platelets: 275 10*3/uL (ref 140–400)
RBC: 4.19 10*6/uL (ref 3.80–5.10)
RDW: 12.5 % (ref 11.0–15.0)
Total Lymphocyte: 25.8 %
WBC: 6 10*3/uL (ref 3.8–10.8)

## 2019-09-01 LAB — VITAMIN D 25 HYDROXY (VIT D DEFICIENCY, FRACTURES): Vit D, 25-Hydroxy: 59 ng/mL (ref 30–100)

## 2019-09-01 LAB — MAGNESIUM: Magnesium: 2.1 mg/dL (ref 1.5–2.5)

## 2019-09-01 LAB — TSH: TSH: 0.43 mIU/L (ref 0.40–4.50)

## 2019-09-07 ENCOUNTER — Ambulatory Visit: Payer: Medicare Other | Admitting: Internal Medicine

## 2019-10-25 ENCOUNTER — Ambulatory Visit
Admission: RE | Admit: 2019-10-25 | Discharge: 2019-10-25 | Disposition: A | Discharge status: Home / Self Care | Payer: Medicare Other | Source: Ambulatory Visit | Attending: Physician Assistant | Admitting: Physician Assistant

## 2019-10-25 ENCOUNTER — Other Ambulatory Visit: Payer: Self-pay

## 2019-10-25 DIAGNOSIS — Z780 Asymptomatic menopausal state: Secondary | ICD-10-CM | POA: Diagnosis not present

## 2019-10-25 DIAGNOSIS — Z1231 Encounter for screening mammogram for malignant neoplasm of breast: Secondary | ICD-10-CM

## 2019-10-25 DIAGNOSIS — M8588 Other specified disorders of bone density and structure, other site: Secondary | ICD-10-CM | POA: Diagnosis not present

## 2019-10-25 DIAGNOSIS — M8580 Other specified disorders of bone density and structure, unspecified site: Secondary | ICD-10-CM

## 2019-10-25 DIAGNOSIS — M810 Age-related osteoporosis without current pathological fracture: Secondary | ICD-10-CM | POA: Diagnosis not present

## 2019-12-12 ENCOUNTER — Other Ambulatory Visit: Payer: Self-pay | Admitting: Internal Medicine

## 2019-12-12 MED ORDER — MONTELUKAST SODIUM 10 MG PO TABS: ORAL_TABLET | ORAL | 0 refills | Status: DC

## 2019-12-13 ENCOUNTER — Other Ambulatory Visit: Payer: Self-pay | Admitting: Internal Medicine

## 2019-12-13 DIAGNOSIS — K219 Gastro-esophageal reflux disease without esophagitis: Secondary | ICD-10-CM

## 2019-12-13 MED ORDER — OMEPRAZOLE 20 MG PO CPDR: DELAYED_RELEASE_CAPSULE | ORAL | 0 refills | Status: DC

## 2019-12-24 ENCOUNTER — Other Ambulatory Visit: Payer: Self-pay | Admitting: Internal Medicine

## 2019-12-24 ENCOUNTER — Telehealth: Payer: Self-pay | Admitting: *Deleted

## 2019-12-24 MED ORDER — DIPHENOXYLATE-ATROPINE 2.5-0.025 MG PO TABS: ORAL_TABLET | ORAL | 0 refills | Status: DC

## 2019-12-24 NOTE — Telephone Encounter (Signed)
Patient called and reported she has had diarrhea, not resolved after taking 9 to 10 Imodium. Dr Melford Aase sent in an RX for Lomotil for the patient and advised her to come for an office visit, if still having diarrhea for 2 to 3 more days.  Patient is aware.

## 2019-12-29 NOTE — Progress Notes (Signed)
FOLLOW UP  Assessment and Plan:   Hypertension Well controlled with current medications  Monitor blood pressure at home; patient to call if consistently greater than 130/80 Continue DASH diet.   Reminder to go to the ER if any CP, SOB, nausea, dizziness, severe HA, changes vision/speech, left arm numbness and tingling and jaw pain.  MVP Monitor, no symptoms;  Cholesterol Currently at goal; continue statin Continue low cholesterol diet and exercise.  Check lipid panel.   Other abnormal glucose Recent A1Cs at goal Discussed diet/exercise, weight management  Defer A1C; check CMP  BMI 25 Continue to recommend diet heavy in fruits and veggies and low in animal meats, cheeses, and dairy products, appropriate calorie intake Discuss exercise recommendations routinely Continue to monitor weight at each visit  Vitamin D Def At goal at last visit; continue supplementation to maintain goal of 60-100 Check Vit D level  GERD Well managed on current medications; failed attempt to taper; continue omeprazole 20 mg daily  Discussed diet, avoiding triggers and other lifestyle changes  Goiter with nodules/hyperthyroid Had neg biopsy in 2004; recent TSH lower than previous; recheck today Denies hyperthyroid symptoms Plan to obtain uptake study depending on TSH results    Continue diet and meds as discussed. Further disposition pending results of labs. Discussed med's effects and SE's.   Over 30 minutes of exam, counseling, chart review, and critical decision making was performed.   Future Appointments  Date Time Provider [place]  04/13/2020 10:00 AM Unk Pinto, MD [facility] None  08/30/2020 11:15 AM Vicie Mutters, PA-C [facility] None    ---------------------------------------------------------------------------------------  HPI 79 y.o. female  presents for 3 month follow up on hypertension, cholesterol, glucose management, weight and vitamin D deficiency.   She  has asthma typically well controlled on singulair and PRN albuterol. She is also on zyrtec. She also does daily nasal saline spray with reduced sinusitis episodes.    she has a diagnosis of GERD which is currently managed by omeprazole 20 mg daily, failed attempt to taper with pepcid.  she reports symptoms is currently well controlled, and denies breakthrough reflux, burning in chest, hoarseness or cough.    BMI is Body mass index is 25.14 kg/m., she has been working on diet and exercise (no longer working at [employer], walks a mile daily and does yoga).  Wt Readings from Last 3 Encounters:  12/30/19 144 lb 3.2 oz (65.4 kg)  08/31/19 150 lb 9.6 oz (68.3 kg)  06/09/19 145 lb (65.8 kg)   She is on verapmil 120 mg, written for BID but reports has taken once daily for many years, prescribed by cardiology due to mitral prolapse, today their BP is BP: 124/74  She does workout. She denies chest pain, shortness of breath, dizziness.   She is on cholesterol medication (pravastatin 40 mg daily) and denies myalgias. Her cholesterol is at goal. The cholesterol last visit was:   Lab Results  Component Value Date   CHOL 168 08/31/2019   HDL 52 08/31/2019   LDLCALC 90 08/31/2019   TRIG 160 (H) 08/31/2019   CHOLHDL 3.2 08/31/2019    She has been working on diet and exercise for glucose management, and denies foot ulcerations, increased appetite, nausea, paresthesia of the feet, polydipsia, polyuria, visual disturbances, vomiting and weight loss. Last A1C in the office was:  Lab Results  Component Value Date   HGBA1C 5.2 03/08/2019   She has hx of thyroid cyst with "undertermined" results per patient; reviewed chart, no biopsy results  available but per Dr. Idell Pickles notes after that has dx of "benign goiter"; she has had intermittently mildly hyperthyroid results over the last several years until last check was lower than typical:  Lab Results  Component Value Date   TSH 0.43 08/31/2019  Denies  anxiety, insomnia, palpitations, heat intolerance, diarrhea.   Patient is on Vitamin D supplement.   Lab Results  Component Value Date   VD25OH 59 08/31/2019        Current Medications:  Current Outpatient Medications on File Prior to Visit  Medication Sig  . albuterol (VENTOLIN HFA) 108 (90 Base) MCG/ACT inhaler Use 2 Inhalations 15 minutes Apart every 4 Hours to Rescue Asthma  . aspirin 81 MG chewable tablet Chew 81 mg by mouth daily.  . Calcium Carb-Cholecalciferol 1000-800 MG-UNIT TABS Take by mouth.  . cetirizine (ZYRTEC) 10 MG tablet Take 10 mg by mouth daily.  . Cholecalciferol (VITAMIN D) 2000 UNITS CAPS Take by mouth.  . cyanocobalamin 500 MCG tablet Take 500 mcg by mouth daily.  . diclofenac sodium (VOLTAREN) 1 % GEL APPLY 2 GRAMS TO AFFECTED AREA FOUR TIMES DAILY  . diphenoxylate-atropine (LOMOTIL) 2.5-0.025 MG tablet Take 1 to 2 tablets every 4 hours if needed for Diarrhea  . montelukast (SINGULAIR) 10 MG tablet Take 1 tablet Daily for Allergies  . Multiple Vitamins-Minerals (MULTIVITAMIN PO) Take by mouth daily.  . Omega-3 Fatty Acids (FISH OIL PO) Take by mouth daily.  Marland Kitchen omeprazole (PRILOSEC) 20 MG capsule Take 1 capsule Daily for Acid Indigestion & Reflux  . pravastatin (PRAVACHOL) 40 MG tablet Take 1 tablet at Bedtime for Cholesterol  . Probiotic Product (PROBIOTIC DAILY PO) Take by mouth daily.  . verapamil (CALAN-SR) 120 MG CR tablet Take 1 tablet (120 mg total) by mouth at bedtime.   No current facility-administered medications on file prior to visit.     Allergies:  Allergies  Allergen Reactions  . Adhesive [Tape] Hives  . Codeine Hives  . Fosamax [Alendronate Sodium] Other (See Comments)    Reflux increase  . Penicillins Hives  . Shellfish Allergy   . Iodine Rash     Medical History:  Past Medical History:  Diagnosis Date  . Anemia   . Asthma   . GERD (gastroesophageal reflux disease)   . Goiter   . Hyperlipidemia   . Hypertension   . MVP  (mitral valve prolapse)   . Osteopenia   . Other abnormal glucose   . Positive colorectal cancer screening using Cologuard test 08/11/2017   Family history- Reviewed and unchanged Social history- Reviewed and unchanged   Review of Systems:  Review of Systems  Constitutional: Negative for malaise/fatigue and weight loss.  HENT: Negative for hearing loss and tinnitus.   Eyes: Negative for blurred vision and double vision.  Respiratory: Negative for cough, shortness of breath and wheezing.   Cardiovascular: Negative for chest pain, palpitations, orthopnea, claudication and leg swelling.  Gastrointestinal: Negative for abdominal pain, blood in stool, constipation, diarrhea, heartburn, melena, nausea and vomiting.  Genitourinary: Negative.   Musculoskeletal: Negative for joint pain and myalgias.  Skin: Negative for rash.  Neurological: Negative for dizziness, tingling, sensory change, weakness and headaches.  Endo/Heme/Allergies: Negative for polydipsia.  Psychiatric/Behavioral: Negative.   All other systems reviewed and are negative.    Physical Exam: BP 124/74   Pulse 72   Temp (!) 97.5 F (36.4 C)   Wt 144 lb 3.2 oz (65.4 kg)   SpO2 97%   BMI 25.14 kg/m  Wt  Readings from Last 3 Encounters:  12/30/19 144 lb 3.2 oz (65.4 kg)  08/31/19 150 lb 9.6 oz (68.3 kg)  06/09/19 145 lb (65.8 kg)   General Appearance: Well nourished, in no apparent distress. Eyes: PERRLA, EOMs, conjunctiva no swelling or erythema Sinuses: No Frontal/maxillary tenderness ENT/Mouth: Ext aud canals clear, TMs without erythema, bulging. No erythema, swelling, or exudate on post pharynx.  Tonsils not swollen or erythematous. Hearing normal.  Neck: Supple, thyroid not notably enlarged, no distinct palpable nodules Respiratory: Respiratory effort normal, BS equal bilaterally without rales, rhonchi, wheezing or stridor.  Cardio: RRR with mild 2/6 systolic murmur. Brisk peripheral pulses without edema.   Abdomen: Soft, + BS.  Non tender, no guarding, rebound, hernias, masses. Lymphatics: Non tender without lymphadenopathy.  Musculoskeletal: Full ROM, 5/5 strength, Normal gait Skin: Warm, dry without rashes, lesions, ecchymosis. Distal nails mildly brittle without cracks Neuro: Cranial nerves intact. No cerebellar symptoms.  Psych: Awake and oriented X 3, normal affect, Insight and Judgment appropriate.    Vicie Mutters, PA-C 10:56 AM [facility]

## 2019-12-30 ENCOUNTER — Encounter: Payer: Self-pay | Admitting: Physician Assistant

## 2019-12-30 ENCOUNTER — Ambulatory Visit (INDEPENDENT_AMBULATORY_CARE_PROVIDER_SITE_OTHER): Payer: Medicare Other | Admitting: Physician Assistant

## 2019-12-30 ENCOUNTER — Other Ambulatory Visit: Payer: Self-pay

## 2019-12-30 VITALS — BP 124/74 | HR 72 | Temp 97.5°F | Wt 144.2 lb

## 2019-12-30 DIAGNOSIS — Z79899 Other long term (current) drug therapy: Secondary | ICD-10-CM

## 2019-12-30 DIAGNOSIS — E559 Vitamin D deficiency, unspecified: Secondary | ICD-10-CM

## 2019-12-30 DIAGNOSIS — I10 Essential (primary) hypertension: Secondary | ICD-10-CM | POA: Diagnosis not present

## 2019-12-30 DIAGNOSIS — E0590 Thyrotoxicosis, unspecified without thyrotoxic crisis or storm: Secondary | ICD-10-CM

## 2019-12-30 DIAGNOSIS — R7309 Other abnormal glucose: Secondary | ICD-10-CM | POA: Diagnosis not present

## 2019-12-30 DIAGNOSIS — E785 Hyperlipidemia, unspecified: Secondary | ICD-10-CM | POA: Diagnosis not present

## 2019-12-30 NOTE — Patient Instructions (Addendum)
Know what a healthy weight is for you (roughly BMI <25) and aim to maintain this  Aim for 7+ servings of fruits and vegetables daily  65-80+ fluid ounces of water or unsweet tea for healthy kidneys  Limit to max 1 drink of alcohol per day; avoid smoking/tobacco  Limit animal fats in diet for cholesterol and heart health - choose grass fed whenever available  Avoid highly processed foods, and foods high in saturated/trans fats  Aim for low stress - take time to unwind and care for your mental health  Aim for 150 min of moderate intensity exercise weekly for heart health, and weights twice weekly for bone health  Aim for 7-9 hours of sleep daily   Ask insurance and pharmacy about shingrix - it is a 2 part shot that we will not be getting in the office.   Suggest getting AFTER covid vaccines, have to wait at least a month This shot can make you feel bad due to such good immune response it can trigger some inflammation so take tylenol or aleve day of or day after and plan on resting.   Can go to [email or web address] for more information  Shingrix Vaccination  Two vaccines are licensed and recommended to prevent shingles in the [place] Zoster vaccine live (ZVL, Zostavax) has been in use since 2006. Recombinant zoster vaccine (RZV, Shingrix), has been in use since 2017 and is recommended by AC[employer] as the preferred shingles vaccine.  What Everyone Should Know about Shingles Vaccine (Shingrix) One of the Recommended Vaccines by Disease Shingles vaccination is the only way to protect against shingles and postherpetic neuralgia (PHN), the most common complication from shingles. CDC recommends that healthy adults 50 years and older get two doses of the shingles vaccine called Shingrix (recombinant zoster vaccine), separated by 2 to 6 months, to prevent shingles and the complications from the disease. Your doctor or pharmacist can give you Shingrix as a  shot in your upper arm. Shingrix provides strong protection against shingles and PHN. Two doses of Shingrix is more than 90% effective at preventing shingles and PHN. Protection stays above 85% for at least the first four years after you get vaccinated. Shingrix is the preferred vaccine, over Zostavax (zoster vaccine live), a shingles vaccine in use since 2006. Zostavax may still be used to prevent shingles in healthy adults 60 years and older. For example, you could use Zostavax if a person is allergic to Shingrix, prefers Zostavax, or requests immediate vaccination and Shingrix is unavailable. Who Should Get Shingrix? Healthy adults 50 years and older should get two doses of Shingrix, separated by 2 to 6 months. You should get Shingrix even if in the past you . had shingles  . received Zostavax  . are not sure if you had chickenpox There is no maximum age for getting Shingrix. If you had shingles in the past, you can get Shingrix to help prevent future occurrences of the disease. There is no specific length of time that you need to wait after having shingles before you can receive Shingrix, but generally you should make sure the shingles rash has gone away before getting vaccinated. You can get Shingrix whether or not you remember having had chickenpox in the past. Studies show that more than 99% of Americans 40 years and older have had chickenpox, even if they don't remember having the disease. Chickenpox and shingles are related because they are caused by the same virus (varicella zoster virus). After a person recovers from chickenpox, the  virus stays dormant (inactive) in the body. It can reactivate years later and cause shingles. If you had Zostavax in the recent past, you should wait at least eight weeks before getting Shingrix. Talk to your healthcare provider to determine the best time to get Shingrix. Shingrix is available in [employer] and pharmacies. To find doctor's offices or  pharmacies near you that offer the vaccine, visit [employer] Vaccine FinderExternal. If you have questions about Shingrix, talk with your healthcare provider. Vaccine for Those 25 Years and Older  Shingrix reduces the risk of shingles and PHN by more than 90% in people 52 and older. CDC recommends the vaccine for healthy adults 58 and older.  Who Should Not Get Shingrix? You should not get Shingrix if you: . have ever had a severe allergic reaction to any component of the vaccine or after a dose of Shingrix  . tested negative for immunity to varicella zoster virus. If you test negative, you should get chickenpox vaccine.  . currently have shingles  . currently are pregnant or breastfeeding. Women who are pregnant or breastfeeding should wait to get Shingrix.  Marland Kitchen receive specific antiviral drugs (acyclovir, famciclovir, or valacyclovir) 24 hours before vaccination (avoid use of these antiviral drugs for 14 days after vaccination)- zoster vaccine live only If you have a minor acute (starts suddenly) illness, such as a cold, you may get Shingrix. But if you have a moderate or severe acute illness, you should usually wait until you recover before getting the vaccine. This includes anyone with a temperature of 101.27F or higher. The side effects of the Shingrix are temporary, and usually last 2 to 3 days. While you may experience pain for a few days after getting Shingrix, the pain will be less severe than having shingles and the complications from the disease. How Well Does Shingrix Work? Two doses of Shingrix provides strong protection against shingles and postherpetic neuralgia (PHN), the most common complication of shingles. . In adults 21 to 78 years old who got two doses, Shingrix was 97% effective in preventing shingles; among adults 70 years and older, Shingrix was 91% effective.  . In adults 63 to 78 years old who got two doses, Shingrix was 91% effective in preventing PHN; among adults 70 years  and older, Shingrix was 89% effective. Shingrix protection remained high (more than 85%) in people 70 years and older throughout the four years following vaccination. Since your risk of shingles and PHN increases as you get older, it is important to have strong protection against shingles in your older years. Top of Page  What Are the Possible Side Effects of Shingrix? Studies show that Shingrix is safe. The vaccine helps your body create a strong defense against shingles. As a result, you are likely to have temporary side effects from getting the shots. The side effects may affect your ability to do normal daily activities for 2 to 3 days. Most people got a sore arm with mild or moderate pain after getting Shingrix, and some also had redness and swelling where they got the shot. Some people felt tired, had muscle pain, a headache, shivering, fever, stomach pain, or nausea. About 1 out of 6 people who got Shingrix experienced side effects that prevented them from doing regular activities. Symptoms went away on their own in about 2 to 3 days. Side effects were more common in younger people. You might have a reaction to the first or second dose of Shingrix, or both doses. If you experience side  effects, you may choose to take over-the-counter pain medicine such as ibuprofen or acetaminophen. If you experience side effects from Shingrix, you should report them to the Vaccine Adverse Event Reporting System (VAERS). Your doctor might file this report, or you can do it yourself through the VAERS websiteExternal, or by calling [phone]. If you have any questions about side effects from Shingrix, talk with your doctor. The shingles vaccine does not contain thimerosal (a preservative containing mercury). Top of Page  When Should I See a Doctor Because of the Side Effects I Experience From Shingrix? In clinical trials, Shingrix was not associated with serious adverse events. In fact, serious side effects  from vaccines are extremely rare. For example, for every 1 million doses of a vaccine given, only one or two people may have a severe allergic reaction. Signs of an allergic reaction happen within minutes or hours after vaccination and include hives, swelling of the face and throat, difficulty breathing, a fast heartbeat, dizziness, or weakness. If you experience these or any other life-threatening symptoms, see a doctor right away. Shingrix causes a strong response in your immune system, so it may produce short-term side effects more intense than you are used to from other vaccines. These side effects can be uncomfortable, but they are expected and usually go away on their own in 2 or 3 days. Top of Page  How Can I Pay For Shingrix? There are several ways shingles vaccine may be paid for: Medicare . Medicare Part D plans cover the shingles vaccine, but there may be a cost to you depending on your plan. There may be a copay for the vaccine, or you may need to pay in full then get reimbursed for a certain amount.  . Medicare Part B does not cover the shingles vaccine. Medicaid . Medicaid may or may not cover the vaccine. Contact your insurer to find out. Private health insurance . Many private health insurance plans will cover the vaccine, but there may be a cost to you depending on your plan. Contact your insurer to find out. Vaccine assistance programs . Some pharmaceutical companies provide vaccines to eligible adults who cannot afford them. You may want to check with the vaccine manufacturer, [employer], about Shingrix. If you do not currently have health insurance, learn more about affordable health coverage optionsExternal. To find doctor's offices or pharmacies near you that offer the vaccine, visit HealthMap Vaccine FinderExternal.

## 2019-12-31 LAB — CBC WITH DIFFERENTIAL/PLATELET
Absolute Monocytes: 634 cells/uL (ref 200–950)
Basophils Absolute: 58 cells/uL (ref 0–200)
Basophils Relative: 0.9 %
Eosinophils Absolute: 288 cells/uL (ref 15–500)
Eosinophils Relative: 4.5 %
HCT: 38.4 % (ref 35.0–45.0)
Hemoglobin: 12.6 g/dL (ref 11.7–15.5)
Lymphs Abs: 1709 cells/uL (ref 850–3900)
MCH: 29.6 pg (ref 27.0–33.0)
MCHC: 32.8 g/dL (ref 32.0–36.0)
MCV: 90.4 fL (ref 80.0–100.0)
MPV: 9 fL (ref 7.5–12.5)
Monocytes Relative: 9.9 %
Neutro Abs: 3712 cells/uL (ref 1500–7800)
Neutrophils Relative %: 58 %
Platelets: 309 10*3/uL (ref 140–400)
RBC: 4.25 10*6/uL (ref 3.80–5.10)
RDW: 13.1 % (ref 11.0–15.0)
Total Lymphocyte: 26.7 %
WBC: 6.4 10*3/uL (ref 3.8–10.8)

## 2019-12-31 LAB — COMPLETE METABOLIC PANEL WITH GFR
AG Ratio: 1.7 (calc) (ref 1.0–2.5)
ALT: 27 U/L (ref 6–29)
AST: 18 U/L (ref 10–35)
Albumin: 3.9 g/dL (ref 3.6–5.1)
Alkaline phosphatase (APISO): 64 U/L (ref 37–153)
BUN: 15 mg/dL (ref 7–25)
CO2: 28 mmol/L (ref 20–32)
Calcium: 9.6 mg/dL (ref 8.6–10.4)
Chloride: 106 mmol/L (ref 98–110)
Creat: 0.72 mg/dL (ref 0.60–0.93)
GFR, Est African American: 94 mL/min/{1.73_m2} (ref >=60)
GFR, Est Non African American: 81 mL/min/{1.73_m2} (ref >=60)
Globulin: 2.3 g/dL (calc) (ref 1.9–3.7)
Glucose, Bld: 91 mg/dL (ref 65–99)
Potassium: 5.1 mmol/L (ref 3.5–5.3)
Sodium: 139 mmol/L (ref 135–146)
Total Bilirubin: 0.3 mg/dL (ref 0.2–1.2)
Total Protein: 6.2 g/dL (ref 6.1–8.1)

## 2019-12-31 LAB — LIPID PANEL
Cholesterol: 173 mg/dL (ref <200)
HDL: 45 mg/dL — ABNORMAL LOW (ref >=50)
LDL Cholesterol (Calc): 100 mg/dL (calc) — ABNORMAL HIGH
Non-HDL Cholesterol (Calc): 128 mg/dL (calc) (ref <130)
Total CHOL/HDL Ratio: 3.8 (calc) (ref <5.0)
Triglycerides: 185 mg/dL — ABNORMAL HIGH (ref <150)

## 2019-12-31 LAB — TSH: TSH: 0.24 mIU/L — ABNORMAL LOW (ref 0.40–4.50)

## 2020-01-25 ENCOUNTER — Other Ambulatory Visit: Payer: Self-pay

## 2020-01-25 DIAGNOSIS — K219 Gastro-esophageal reflux disease without esophagitis: Secondary | ICD-10-CM

## 2020-01-25 MED ORDER — OMEPRAZOLE 20 MG PO CPDR: DELAYED_RELEASE_CAPSULE | ORAL | 0 refills | Status: DC

## 2020-02-02 DIAGNOSIS — L821 Other seborrheic keratosis: Secondary | ICD-10-CM | POA: Diagnosis not present

## 2020-02-02 DIAGNOSIS — D3611 Benign neoplasm of peripheral nerves and autonomic nervous system of face, head, and neck: Secondary | ICD-10-CM | POA: Diagnosis not present

## 2020-02-23 ENCOUNTER — Other Ambulatory Visit: Payer: Self-pay

## 2020-02-23 MED ORDER — VERAPAMIL HCL ER 120 MG PO TBCR: 120.0000 mg | EXTENDED_RELEASE_TABLET | Freq: Every day | ORAL | 1 refills | Status: DC

## 2020-03-12 ENCOUNTER — Other Ambulatory Visit: Payer: Self-pay | Admitting: Internal Medicine

## 2020-03-12 MED ORDER — MONTELUKAST SODIUM 10 MG PO TABS: ORAL_TABLET | ORAL | 0 refills | Status: DC

## 2020-04-12 ENCOUNTER — Encounter: Payer: Self-pay | Admitting: Internal Medicine

## 2020-04-12 NOTE — Progress Notes (Signed)
Annual Screening/Preventative Visit & Comprehensive Evaluation &  Examination     This very nice 79 y.o. MWF  presents for a Screening /Preventative Visit & comprehensive evaluation and management of multiple medical co-morbidities.  Patient has been followed for HTN, HLD, Prediabetes  and Vitamin D Deficiency.  Patient's GERD is controlled on her meds.       HTN predates circa 1995. Patient's BP has been controlled at home and patient denies any cardiac symptoms as chest pain, palpitations, shortness of breath, dizziness or ankle swelling. Today's BP is at goal - 126/78.      Patient's hyperlipidemia is controlled with diet / Pravastatin. Patient denies myalgias or other medication SE's. Last lipids were at goal:  Lab Results  Component Value Date   CHOL 173 12/30/2019   HDL 45 (L) 12/30/2019   LDLCALC 100 (H) 12/30/2019   TRIG 185 (H) 12/30/2019   CHOLHDL 3.8 12/30/2019       Patient has hx/o prediabetes  (A1c 6.0% / 2014)  and patient denies reactive hypoglycemic symptoms, visual blurring, diabetic polys or paresthesias. Last A1c was Normal & at goal:  Lab Results  Component Value Date   HGBA1C 5.2 03/08/2019       Finally, patient has history of Vitamin D Deficiency and last Vitamin D was at goal:  Lab Results  Component Value Date   VD25OH 59 08/31/2019    Current Outpatient Medications on File Prior to Visit  Medication Sig  . albuterol (VENTOLIN HFA) 108 (90 Base) MCG/ACT inhaler Use 2 Inhalations 15 minutes Apart every 4 Hours to Rescue Asthma  . Ascorbic Acid (VITAMIN C) 500 MG CAPS Take 1 capsule by mouth daily.  Marland Kitchen aspirin 81 MG chewable tablet Chew 81 mg by mouth daily.  . Calcium Carb-Cholecalciferol 1000-800 MG-UNIT TABS Take by mouth.  . cetirizine (ZYRTEC) 10 MG tablet Take 10 mg by mouth daily.  . Cholecalciferol (VITAMIN D) 2000 UNITS CAPS Take by mouth.  . cyanocobalamin 500 MCG tablet Take 500 mcg by mouth daily.  . diclofenac sodium (VOLTAREN) 1 % GEL  APPLY 2 GRAMS TO AFFECTED AREA FOUR TIMES DAILY  . diphenoxylate-atropine (LOMOTIL) 2.5-0.025 MG tablet Take 1 to 2 tablets every 4 hours if needed for Diarrhea  . Magnesium 500 MG TABS Take 1 tablet by mouth daily.  . montelukast (SINGULAIR) 10 MG tablet Take 1 tablet Daily for Allergies  . Multiple Vitamins-Minerals (MULTIVITAMIN PO) Take by mouth daily.  . Omega-3 Fatty Acids (FISH OIL PO) Take by mouth daily.  Marland Kitchen omeprazole (PRILOSEC) 20 MG capsule Take 1 capsule Daily for Acid Indigestion & Reflux  . pravastatin (PRAVACHOL) 40 MG tablet Take 1 tablet at Bedtime for Cholesterol  . Probiotic Product (PROBIOTIC DAILY PO) Take by mouth daily.  . verapamil (CALAN-SR) 120 MG CR tablet Take 1 tablet (120 mg total) by mouth at bedtime.  Marland Kitchen zinc gluconate 50 MG tablet Take 50 mg by mouth daily.   No current facility-administered medications on file prior to visit.   Allergies  Allergen Reactions  . Adhesive [Tape] Hives  . Codeine Hives  . Fosamax [Alendronate Sodium] Other (See Comments)    Reflux increase  . Penicillins Hives  . Shellfish Allergy   . Iodine Rash   Past Medical History:  Diagnosis Date  . Anemia   . Asthma   . GERD (gastroesophageal reflux disease)   . Goiter   . Hyperlipidemia   . Hypertension   . MVP (mitral valve prolapse)   .  Osteopenia   . Other abnormal glucose   . Positive colorectal cancer screening using Cologuard test 08/11/2017   Health Maintenance  Topic Date Due  . Hepatitis C Screening  Never done  . TETANUS/TDAP  12/06/2019  . INFLUENZA VACCINE  04/09/2020  . COLONOSCOPY  03/31/2023  . DEXA SCAN  Completed  . COVID-19 Vaccine  Completed  . PNA vac Low Risk Adult  Completed   Immunization History  Administered Date(s) Administered  . Influenza, High Dose Seasonal PF 07/14/2014, 05/21/2016, 06/20/2017, 05/19/2019  . Influenza-Unspecified 06/28/2013, 07/06/2015, 06/09/2018  . Moderna SARS-COVID-2 Vaccination 09/30/2019, 10/19/2019  .  Pneumococcal Conjugate-13 02/01/2014  . Pneumococcal-Unspecified 09/10/2007  . Tdap 12/05/2009  . Zoster 09/09/2005    Last Colon - 09/30/2017 - Dr Arty Baumgartner recc 5 yr f/u due Jan 2024  Last MGM - 10/25/2019  Past Surgical History:  Procedure Laterality Date  . APPENDECTOMY    . CHOLECYSTECTOMY    . EYE SURGERY    . TONSILLECTOMY AND ADENOIDECTOMY     Family History  Problem Relation Age of Onset  . Hypertension Mother   . Diabetes Father   . Heart disease Father   . Hyperlipidemia Sister   . Hyperlipidemia Brother   . Breast cancer Neg Hx    Social History   Tobacco Use  . Smoking status: Never Smoker  . Smokeless tobacco: Never Used  Substance Use Topics  . Alcohol use: No  . Drug use: No    ROS Constitutional: Denies fever, chills, weight loss/gain, headaches, insomnia,  night sweats, and change in appetite. Does c/o fatigue. Eyes: Denies redness, blurred vision, diplopia, discharge, itchy, watery eyes.  ENT: Denies discharge, congestion, post nasal drip, epistaxis, sore throat, earache, hearing loss, dental pain, Tinnitus, Vertigo, Sinus pain, snoring.  Cardio: Denies chest pain, palpitations, irregular heartbeat, syncope, dyspnea, diaphoresis, orthopnea, PND, claudication, edema Respiratory: denies cough, dyspnea, DOE, pleurisy, hoarseness, laryngitis, wheezing.  Gastrointestinal: Denies dysphagia, heartburn, reflux, water brash, pain, cramps, nausea, vomiting, bloating, diarrhea, constipation, hematemesis, melena, hematochezia, jaundice, hemorrhoids Genitourinary: Denies dysuria, frequency, urgency, nocturia, hesitancy, discharge, hematuria, flank pain Breast: Breast lumps, nipple discharge, bleeding.  Musculoskeletal: Denies arthralgia, myalgia, stiffness, Jt. Swelling, pain, limp, and strain/sprain. Denies falls. Skin: Denies puritis, rash, hives, warts, acne, eczema, changing in skin lesion Neuro: No weakness, tremor, incoordination, spasms, paresthesia,  pain Psychiatric: Denies confusion, memory loss, sensory loss. Denies Depression. Endocrine: Denies change in weight, skin, hair change, nocturia, and paresthesia, diabetic polys, visual blurring, hyper / hypo glycemic episodes.  Heme/Lymph: No excessive bleeding, bruising, enlarged lymph nodes.  Physical Exam  BP 126/78   Pulse 72   Temp (!) 97.2 F (36.2 C)   Resp 16   Ht 5' 3.5" (1.613 m)   Wt 140 lb 9.6 oz (63.8 kg)   BMI 24.52 kg/m   General Appearance: Well nourished, well groomed and in no apparent distress.  Eyes: PERRLA, EOMs, conjunctiva no swelling or erythema, normal fundi and vessels. Sinuses: No frontal/maxillary tenderness ENT/Mouth: EACs patent / TMs  nl. Nares clear without erythema, swelling, mucoid exudates. Oral hygiene is good. No erythema, swelling, or exudate. Tongue normal, non-obstructing. Tonsils not swollen or erythematous. Hearing normal.  Neck: Supple, thyroid not palpable. No bruits, nodes or JVD. Respiratory: Respiratory effort normal.  BS equal and clear bilateral without rales, rhonci, wheezing or stridor. Cardio: Heart sounds are normal with regular rate and rhythm and no murmurs, rubs or gallops. Peripheral pulses are normal and equal bilaterally without edema. No aortic or  femoral bruits. Chest: symmetric with normal excursions and percussion. Breasts: Symmetric, without lumps, nipple discharge, retractions, or fibrocystic changes.  Abdomen: Flat, soft with bowel sounds active. Nontender, no guarding, rebound, hernias, masses, or organomegaly.  Lymphatics: Non tender without lymphadenopathy.  Genitourinary:  Musculoskeletal: Full ROM all peripheral extremities, joint stability, 5/5 strength, and normal gait. Skin: Warm and dry without rashes, lesions, cyanosis, clubbing or  ecchymosis.  Neuro: Cranial nerves intact, reflexes equal bilaterally. Normal muscle tone, no cerebellar symptoms. Sensation intact.  Pysch: Alert and oriented X 3, normal  affect, Insight and Judgment appropriate.   Assessment and Plan  1. Annual Preventative Screening Examination  2. Essential hypertension  - EKG 12-Lead - Urinalysis, Routine w reflex microscopic - Microalbumin / creatinine urine ratio - CBC with Differential/Platelet - COMPLETE METABOLIC PANEL WITH GFR - Magnesium - TSH  3. Hyperlipidemia, mixed  - EKG 12-Lead - Lipid panel - TSH  4. Abnormal glucose  - EKG 12-Lead - Hemoglobin A1c - Insulin, random  5. Gastroesophageal reflux disease  - CBC with Differential/Platelet  6. Vitamin D deficiency  - VITAMIN D 25 Hydroxyl  7. Screening for colorectal cancer  - POC Hemoccult Bld/Stl  8. Screening for ischemic heart disease  - EKG 12-Lead  9. FHx: heart disease  - EKG 12-Lead  10. Medication management  - Urinalysis, Routine w reflex microscopic - Microalbumin / creatinine urine ratio - CBC with Differential/Platelet - COMPLETE METABOLIC PANEL WITH GFR - Magnesium - Lipid panel - TSH - Hemoglobin A1c - Insulin, random - VITAMIN D 25 Hydroxy         Patient was counseled in prudent diet to achieve/maintain BMI less than 25 for weight control, BP monitoring, regular exercise and medications. Discussed med's effects and SE's. Screening labs and tests as requested with regular follow-up as recommended. Over 40 minutes of exam, counseling, chart review and high complex critical decision making was performed.   Kirtland Bouchard, MD

## 2020-04-12 NOTE — Patient Instructions (Addendum)

## 2020-04-13 ENCOUNTER — Other Ambulatory Visit: Payer: Self-pay

## 2020-04-13 ENCOUNTER — Ambulatory Visit (INDEPENDENT_AMBULATORY_CARE_PROVIDER_SITE_OTHER): Payer: Medicare Other | Admitting: Internal Medicine

## 2020-04-13 VITALS — BP 126/78 | HR 72 | Temp 97.2°F | Resp 16 | Ht 63.5 in | Wt 140.6 lb

## 2020-04-13 DIAGNOSIS — E782 Mixed hyperlipidemia: Secondary | ICD-10-CM

## 2020-04-13 DIAGNOSIS — Z8249 Family history of ischemic heart disease and other diseases of the circulatory system: Secondary | ICD-10-CM | POA: Diagnosis not present

## 2020-04-13 DIAGNOSIS — Z0000 Encounter for general adult medical examination without abnormal findings: Secondary | ICD-10-CM | POA: Diagnosis not present

## 2020-04-13 DIAGNOSIS — E559 Vitamin D deficiency, unspecified: Secondary | ICD-10-CM

## 2020-04-13 DIAGNOSIS — Z1211 Encounter for screening for malignant neoplasm of colon: Secondary | ICD-10-CM

## 2020-04-13 DIAGNOSIS — Z136 Encounter for screening for cardiovascular disorders: Secondary | ICD-10-CM

## 2020-04-13 DIAGNOSIS — I10 Essential (primary) hypertension: Secondary | ICD-10-CM

## 2020-04-13 DIAGNOSIS — K219 Gastro-esophageal reflux disease without esophagitis: Secondary | ICD-10-CM | POA: Diagnosis not present

## 2020-04-13 DIAGNOSIS — R7309 Other abnormal glucose: Secondary | ICD-10-CM | POA: Diagnosis not present

## 2020-04-13 DIAGNOSIS — Z79899 Other long term (current) drug therapy: Secondary | ICD-10-CM

## 2020-04-13 DIAGNOSIS — Z1212 Encounter for screening for malignant neoplasm of rectum: Secondary | ICD-10-CM

## 2020-04-13 DIAGNOSIS — Z0001 Encounter for general adult medical examination with abnormal findings: Secondary | ICD-10-CM

## 2020-04-14 ENCOUNTER — Other Ambulatory Visit: Payer: Self-pay | Admitting: Internal Medicine

## 2020-04-14 LAB — LIPID PANEL
Cholesterol: 203 mg/dL — ABNORMAL HIGH (ref <200)
HDL: 52 mg/dL (ref >=50)
LDL Cholesterol (Calc): 130 mg/dL (calc) — ABNORMAL HIGH
Non-HDL Cholesterol (Calc): 151 mg/dL (calc) — ABNORMAL HIGH (ref <130)
Total CHOL/HDL Ratio: 3.9 (calc) (ref <5.0)
Triglycerides: 104 mg/dL (ref <150)

## 2020-04-14 LAB — HEMOGLOBIN A1C
Hgb A1c MFr Bld: 5.4 % of total Hgb (ref <5.7)
Mean Plasma Glucose: 108 (calc)
eAG (mmol/L): 6 (calc)

## 2020-04-14 LAB — URINALYSIS, ROUTINE W REFLEX MICROSCOPIC
Bacteria, UA: NONE SEEN /HPF
Bilirubin Urine: NEGATIVE
Glucose, UA: NEGATIVE
Hgb urine dipstick: NEGATIVE
Hyaline Cast: NONE SEEN /LPF
Ketones, ur: NEGATIVE
Nitrite: NEGATIVE
Protein, ur: NEGATIVE
RBC / HPF: NONE SEEN /HPF (ref 0–2)
Specific Gravity, Urine: 1.008 (ref 1.001–1.03)
Squamous Epithelial / HPF: NONE SEEN /HPF (ref <=5)
pH: 7 (ref 5.0–8.0)

## 2020-04-14 LAB — COMPLETE METABOLIC PANEL WITH GFR
AG Ratio: 1.8 (calc) (ref 1.0–2.5)
ALT: 14 U/L (ref 6–29)
AST: 20 U/L (ref 10–35)
Albumin: 4.5 g/dL (ref 3.6–5.1)
Alkaline phosphatase (APISO): 83 U/L (ref 37–153)
BUN: 15 mg/dL (ref 7–25)
CO2: 30 mmol/L (ref 20–32)
Calcium: 10.3 mg/dL (ref 8.6–10.4)
Chloride: 103 mmol/L (ref 98–110)
Creat: 0.75 mg/dL (ref 0.60–0.93)
GFR, Est African American: 89 mL/min/{1.73_m2} (ref >=60)
GFR, Est Non African American: 77 mL/min/{1.73_m2} (ref >=60)
Globulin: 2.5 g/dL (calc) (ref 1.9–3.7)
Glucose, Bld: 92 mg/dL (ref 65–99)
Potassium: 4.5 mmol/L (ref 3.5–5.3)
Sodium: 139 mmol/L (ref 135–146)
Total Bilirubin: 0.5 mg/dL (ref 0.2–1.2)
Total Protein: 7 g/dL (ref 6.1–8.1)

## 2020-04-14 LAB — CBC WITH DIFFERENTIAL/PLATELET
Absolute Monocytes: 517 cells/uL (ref 200–950)
Basophils Absolute: 50 cells/uL (ref 0–200)
Basophils Relative: 0.9 %
Eosinophils Absolute: 99 cells/uL (ref 15–500)
Eosinophils Relative: 1.8 %
HCT: 42.6 % (ref 35.0–45.0)
Hemoglobin: 14.4 g/dL (ref 11.7–15.5)
Lymphs Abs: 1656 cells/uL (ref 850–3900)
MCH: 30.6 pg (ref 27.0–33.0)
MCHC: 33.8 g/dL (ref 32.0–36.0)
MCV: 90.6 fL (ref 80.0–100.0)
MPV: 9.4 fL (ref 7.5–12.5)
Monocytes Relative: 9.4 %
Neutro Abs: 3179 cells/uL (ref 1500–7800)
Neutrophils Relative %: 57.8 %
Platelets: 304 10*3/uL (ref 140–400)
RBC: 4.7 10*6/uL (ref 3.80–5.10)
RDW: 12.5 % (ref 11.0–15.0)
Total Lymphocyte: 30.1 %
WBC: 5.5 10*3/uL (ref 3.8–10.8)

## 2020-04-14 LAB — INSULIN, RANDOM: Insulin: 11.2 u[IU]/mL

## 2020-04-14 LAB — MAGNESIUM: Magnesium: 2.2 mg/dL (ref 1.5–2.5)

## 2020-04-14 LAB — MICROALBUMIN / CREATININE URINE RATIO
Creatinine, Urine: 39 mg/dL (ref 20–275)
Microalb Creat Ratio: 8 mcg/mg creat (ref <30)
Microalb, Ur: 0.3 mg/dL

## 2020-04-14 LAB — VITAMIN D 25 HYDROXY (VIT D DEFICIENCY, FRACTURES): Vit D, 25-Hydroxy: 49 ng/mL (ref 30–100)

## 2020-04-14 LAB — TSH: TSH: 0.13 mIU/L — ABNORMAL LOW (ref 0.40–4.50)

## 2020-04-14 MED ORDER — ROSUVASTATIN CALCIUM 20 MG PO TABS: ORAL_TABLET | ORAL | 0 refills | Status: DC

## 2020-04-14 NOTE — Progress Notes (Signed)
============================================================  -    Total Chol = 203 - elevated  (Ideal or Goal is less than 150 )  - and   - LDL Chol = 130 - very elevated  (Ideal or Goal is less than 70  !  )   - Stop your Pravastatin   and   New Rx for Rosuvastatin (Crestor) sent to your Drugstore ============================================================  -  TSH still low  showing Thyroid hormone slightly elevated in Blood consistent with slightly overactive Thyroid gland. Will continue to monitor unless develop sx's of fast or irregular heart beat, uncontrolled hypertension, weight loss, tremor, unintentional weight loss ============================================================  -  Vitamin D = 49 - slightly low   - Vitamin D goal is between 70-100.  $$$$$$$$$$$$$$$$$$$$$$$$$$$$$$$$$$$$$$$$$$$$$$$$$$$$$$$$$$$$  - Please INCREASE your Vitamin D 2,000 unit capsules up to  2 capsules = 4,000 units /daily   $$$$$$$$$$$$$$$$$$$$$$$$$$$$$$$$$$$$$$$$$$$$$$$$$$$$$$$$$$$$ - It is very important as a natural anti-inflammatory and helping the  immune system protect against viral infections, like the Covid-19    helping hair, skin, and nails, as well as reducing stroke and heart attack risk.   - It helps your bones and helps with mood.  - It also decreases numerous cancer risks so please take it as directed.   - Low Vit D is associated with a 200-300% higher risk for CANCER   and 200-300% higher risk for HEART   ATTACK  &  STROKE.    - It is also associated with higher death rate at younger ages,   autoimmune diseases like Rheumatoid arthritis, Lupus, Multiple Sclerosis.     - Also many other serious conditions, like depression, Alzheimer's  Dementia, infertility, muscle aches, fatigue, fibromyalgia - just to name a few ==========================================================  -  All Else - CBC - Kidneys - Electrolytes - Liver - Magnesium & Thyroid    - all  Normal /  OK ==========================================================

## 2020-04-15 ENCOUNTER — Encounter: Payer: Self-pay | Admitting: Internal Medicine

## 2020-06-06 ENCOUNTER — Ambulatory Visit (INDEPENDENT_AMBULATORY_CARE_PROVIDER_SITE_OTHER): Payer: Medicare Other | Admitting: *Deleted

## 2020-06-06 ENCOUNTER — Other Ambulatory Visit: Payer: Self-pay

## 2020-06-06 VITALS — Temp 97.2°F

## 2020-06-06 DIAGNOSIS — Z23 Encounter for immunization: Secondary | ICD-10-CM | POA: Diagnosis not present

## 2020-06-09 ENCOUNTER — Other Ambulatory Visit: Payer: Self-pay | Admitting: Internal Medicine

## 2020-06-27 ENCOUNTER — Other Ambulatory Visit: Payer: Self-pay

## 2020-06-27 DIAGNOSIS — Z1211 Encounter for screening for malignant neoplasm of colon: Secondary | ICD-10-CM

## 2020-06-27 LAB — POC HEMOCCULT BLD/STL (HOME/3-CARD/SCREEN)
Card #2 Fecal Occult Blod, POC: NEGATIVE
Card #3 Fecal Occult Blood, POC: NEGATIVE
Fecal Occult Blood, POC: NEGATIVE

## 2020-06-28 ENCOUNTER — Other Ambulatory Visit: Payer: Self-pay | Admitting: Internal Medicine

## 2020-06-28 DIAGNOSIS — Z1211 Encounter for screening for malignant neoplasm of colon: Secondary | ICD-10-CM | POA: Diagnosis not present

## 2020-06-28 DIAGNOSIS — Z1212 Encounter for screening for malignant neoplasm of rectum: Secondary | ICD-10-CM | POA: Diagnosis not present

## 2020-06-28 DIAGNOSIS — K219 Gastro-esophageal reflux disease without esophagitis: Secondary | ICD-10-CM

## 2020-06-28 MED ORDER — OMEPRAZOLE 20 MG PO CPDR: DELAYED_RELEASE_CAPSULE | ORAL | 0 refills | Status: DC

## 2020-07-10 ENCOUNTER — Other Ambulatory Visit: Payer: Self-pay | Admitting: Internal Medicine

## 2020-07-10 DIAGNOSIS — E782 Mixed hyperlipidemia: Secondary | ICD-10-CM

## 2020-07-10 MED ORDER — ROSUVASTATIN CALCIUM 20 MG PO TABS: ORAL_TABLET | ORAL | 0 refills | Status: DC

## 2020-07-23 ENCOUNTER — Other Ambulatory Visit: Payer: Self-pay | Admitting: Internal Medicine

## 2020-07-23 DIAGNOSIS — K219 Gastro-esophageal reflux disease without esophagitis: Secondary | ICD-10-CM

## 2020-08-22 ENCOUNTER — Other Ambulatory Visit: Payer: Self-pay

## 2020-08-22 MED ORDER — VERAPAMIL HCL ER 120 MG PO TBCR
120.0000 mg | EXTENDED_RELEASE_TABLET | Freq: Every day | ORAL | 1 refills | Status: DC
Start: 2020-08-22 — End: 2020-12-06

## 2020-08-30 ENCOUNTER — Ambulatory Visit (INDEPENDENT_AMBULATORY_CARE_PROVIDER_SITE_OTHER): Payer: Medicare Other | Admitting: Adult Health Nurse Practitioner

## 2020-08-30 ENCOUNTER — Other Ambulatory Visit: Payer: Self-pay

## 2020-08-30 ENCOUNTER — Encounter: Payer: Self-pay | Admitting: Adult Health Nurse Practitioner

## 2020-08-30 VITALS — BP 126/88 | HR 64 | Temp 97.7°F | Ht 63.0 in | Wt 140.0 lb

## 2020-08-30 DIAGNOSIS — K219 Gastro-esophageal reflux disease without esophagitis: Secondary | ICD-10-CM

## 2020-08-30 DIAGNOSIS — M810 Age-related osteoporosis without current pathological fracture: Secondary | ICD-10-CM

## 2020-08-30 DIAGNOSIS — Z23 Encounter for immunization: Secondary | ICD-10-CM

## 2020-08-30 DIAGNOSIS — J45909 Unspecified asthma, uncomplicated: Secondary | ICD-10-CM

## 2020-08-30 DIAGNOSIS — R7309 Other abnormal glucose: Secondary | ICD-10-CM

## 2020-08-30 DIAGNOSIS — R6889 Other general symptoms and signs: Secondary | ICD-10-CM | POA: Diagnosis not present

## 2020-08-30 DIAGNOSIS — I341 Nonrheumatic mitral (valve) prolapse: Secondary | ICD-10-CM | POA: Diagnosis not present

## 2020-08-30 DIAGNOSIS — I10 Essential (primary) hypertension: Secondary | ICD-10-CM

## 2020-08-30 DIAGNOSIS — Z0001 Encounter for general adult medical examination with abnormal findings: Secondary | ICD-10-CM | POA: Diagnosis not present

## 2020-08-30 DIAGNOSIS — E559 Vitamin D deficiency, unspecified: Secondary | ICD-10-CM

## 2020-08-30 DIAGNOSIS — E0590 Thyrotoxicosis, unspecified without thyrotoxic crisis or storm: Secondary | ICD-10-CM

## 2020-08-30 DIAGNOSIS — E782 Mixed hyperlipidemia: Secondary | ICD-10-CM | POA: Diagnosis not present

## 2020-08-30 NOTE — Progress Notes (Signed)
MEDICAL ANNUAL WELLNESS   Assessment / Plan:    Encounter for Medicare annual wellness exam Yearly  Essential hypertension Continue current medications: Verampamil 120mg  Monitor blood pressure at home; call if consistently over 130/80 Continue DASH diet.   Reminder to go to the ER if any CP, SOB, nausea, dizziness, severe HA, changes vision/speech, left arm numbness and tingling and jaw pain.  Hyperlipidemia, unspecified hyperlipidemia type Continue medications: Rosuvastatin 20mg  Discussed dietary and exercise modifications Low fat diet  Abnormal glucose Discussed disease progression and risks Discussed diet/exercise, weight management and risk modification  Medication management Continued  Anemia, unspecified type - monitor  Gastroesophageal reflux disease, esophagitis presence not specified Continue PPI/H2 blocker, diet discussed  Uncomplicated asthma, unspecified asthma severity, unspecified whether persistent Monitor, controlled  MVP (mitral valve prolapse) Monitor, no symptoms Has Albuterol PRN, has not had to use.  Inhaler is in date.  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 Discussed med's effects and SE's.   Over 30 minutes of face to face interview, exam, counseling, chart review, and critical decision making was performed.    Future Appointments  Date Time Provider [place]  11/28/2020 10:30 AM Unk Pinto, MD [facility] None  04/13/2021 11:00 AM Unk Pinto, MD [facility] None  08/30/2021 11:00 AM Garnet Sierras, NP [facility] None     Plan:   During the course of the visit the patient was educated and counseled about appropriate screening and preventive services including:     Pneumococcal vaccine   Influenza vaccine  Td vaccine  Screening electrocardiogram  Screening mammography  Bone densitometry screening  Colorectal cancer screening  Diabetes screening  Glaucoma screening  Nutrition counseling   Subjective:   Isabel Oliver is a  79 y.o. WMF who presents for Medicare Annual Wellness Visit and follow up for chronic illness including HTN, chol, abnormal glucose, vitamin D def, osteoporosis.    Today she reports overall she is doing well.  No health or medication concerns today. 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  Her last Tdap was in 2011.  Had + cologuard with subsequent colonoscopy 04/2018, benign polyp, 5 years with Dr. Oletta Lamas.  She states 2 nights a month she will have some issues with sleeping, and she will not be able to stay asleep.    Her blood pressure has been controlled at home, today their BP is BP: 126/88 She does workout, since she is not longer working, she is walking about a mile in her neighborhood and does some yoga. She denies chest pain, shortness of breath, dizziness. She has asthma, does inhaler once every day in the AM.   She is on cholesterol medication and denies myalgias. Her cholesterol is not at goal. The cholesterol last visit was:   Lab Results  Component Value Date   CHOL 203 (H) 04/13/2020   HDL 52 04/13/2020   LDLCALC 130 (H) 04/13/2020   TRIG 104 04/13/2020   CHOLHDL 3.9 04/13/2020    Last A1C in the office was:  Lab Results  Component Value Date   HGBA1C 5.4 04/13/2020   Patient is on Vitamin D supplement, 2000 international units she thinks..   Lab Results  Component Value Date   VD25OH 49 04/13/2020  She has history of osteoporosis, could not tolerate calcitonin or fosamax due to her GERD.  BMI is Body mass index is 24.8 kg/m. weight is up some, contributes it to the holidays. Wt Readings from Last 3 Encounters:  08/30/20 140 lb (63.5 kg)   04/13/20 140 lb 9.6 oz (63.8 kg)  12/30/19 144 lb 3.2 oz (65.4 kg)     Names of Other Physician/Practitioners you currently use: 1. [facility]- here for primary care 2. Dr. Dione Booze, eye doctor, 06/2018 3. Dr Fransico Setters. In [place], dentist, last visit q 6 months Patient Care Team: Lucky Cowboy, MD as PCP - General (Internal Medicine) Carman Ching, MD (Inactive) as Consulting Physician (Gastroenterology) Miguel Aschoff, MD (Inactive) as Consulting Physician (Obstetrics and Gynecology) Arminda Resides, MD as Consulting Physician (Dermatology)   Medication Review Current Outpatient Medications on File Prior to Visit  Medication Sig Dispense Refill  . albuterol (VENTOLIN HFA) 108 (90 Base) MCG/ACT inhaler Use 2 Inhalations 15 minutes Apart every 4 Hours to Rescue Asthma 48 g 1  . Ascorbic Acid (VITAMIN C) 500 MG CAPS Take 1 capsule by mouth daily.    Marland Kitchen aspirin 81 MG chewable tablet Chew 81 mg by mouth daily.    . Calcium Carb-Cholecalciferol 1000-800 MG-UNIT TABS Take by mouth.    . cetirizine (ZYRTEC) 10 MG tablet Take 10 mg by mouth daily.    . Cholecalciferol (VITAMIN D) 2000 UNITS CAPS Take by mouth.    . cyanocobalamin 500 MCG tablet Take 500 mcg by mouth daily.    . diclofenac sodium (VOLTAREN) 1 % GEL APPLY 2 GRAMS TO AFFECTED AREA FOUR TIMES DAILY 100 g 0  . diphenoxylate-atropine (LOMOTIL) 2.5-0.025 MG tablet Take 1 to 2 tablets every 4 hours if needed for Diarrhea 30 tablet 0  . Magnesium 500 MG TABS Take 1 tablet by mouth daily.    . montelukast (SINGULAIR) 10 MG tablet Take    1 tablet   Daily     for Allergies 90 tablet 0  . Multiple Vitamins-Minerals (MULTIVITAMIN PO) Take by mouth daily.    . Omega-3 Fatty Acids (FISH OIL PO) Take by mouth daily.    Marland Kitchen omeprazole (PRILOSEC) 20 MG capsule Take       1 capsule      Daily         to Prevent Indigestion & Heartburn 90 capsule 0  . Probiotic Product (PROBIOTIC DAILY PO) Take by mouth  daily.    . rosuvastatin (CRESTOR) 20 MG tablet Take      1 tablet      Daily       for Cholesterol 90 tablet 0  . verapamil (CALAN-SR) 120 MG CR tablet Take 1 tablet (120 mg total) by mouth at bedtime. 90 tablet 1  . zinc gluconate 50 MG tablet Take 50 mg by mouth daily.     No current facility-administered medications on file prior to visit.    Current Problems (verified) Patient Active Problem List   Diagnosis Date Noted  . Hyperthyroidism 06/09/2019  . Encounter for Medicare annual wellness exam 03/29/2015  . Medication management 12/05/2014  . Vitamin D deficiency 12/05/2014  . Other abnormal glucose   . MVP (mitral valve prolapse)   . Hypertension   . Hyperlipidemia   . GERD (gastroesophageal reflux disease)   . Asthma     Screening Tests Immunization History  Administered Date(s) Administered  . Influenza, High Dose Seasonal PF 07/14/2014, 05/21/2016, 06/20/2017, 05/19/2019, 06/06/2020  .  Influenza-Unspecified 06/28/2013, 07/06/2015, 06/09/2018  . Moderna Sars-Covid-2 Vaccination 09/30/2019, 10/19/2019  . Pneumococcal Conjugate-13 02/01/2014  . Pneumococcal-Unspecified 09/10/2007  . Tdap 12/05/2009  . Zoster 09/09/2005   Preventative care: Last colonoscopy: 04/2018 Jodelle Gross 2024 Last mammogram: 10/2019 CAT B- getting in FEB with DEXA Last pap smear/pelvic exam: 2011 remote DEXA: 07/16/2015+ will get next year osteoporsis- on D 3 and calcium   Prior vaccinations: TD or Tdap: 2011  Influenza: 2021  Pneumococcal: 2009 Prevnar 13: 2015 Shingles/Zostavax: 2007- suggest new vaccine SARS-COV2-Modera- 07/12/2020   Allergies Allergies  Allergen Reactions  . Adhesive [Tape] Hives  . Codeine Hives  . Fosamax [Alendronate Sodium] Other (See Comments)    Reflux increase  . Penicillins Hives  . Shellfish Allergy   . Iodine Rash    SURGICAL HISTORY She  has a past surgical history that includes Appendectomy; Cholecystectomy; Eye surgery; and Tonsillectomy and  adenoidectomy. FAMILY HISTORY Her family history includes Diabetes in her father; Heart disease in her father; Hyperlipidemia in her brother and sister; Hypertension in her mother. SOCIAL HISTORY She  reports that she has never smoked. She has never used smokeless tobacco. She reports that she does not drink alcohol and does not use drugs.  MEDICARE WELLNESS OBJECTIVES: Physical activity: Current Exercise Habits: Home exercise routine, Type of exercise: walking, Time (Minutes): 30, Frequency (Times/Week): 7, Weekly Exercise (Minutes/Week): 210, Intensity: Mild, Exercise limited by: None identified Cardiac risk factors: Cardiac Risk Factors include: advanced age (>16men, >33 women);dyslipidemia;hypertension Depression/mood screen:   Depression screen [facility] 2/9 08/30/2020  Decreased Interest 0  Down, Depressed, Hopeless 0  PHQ - 2 Score 0    ADLs:  In your present state of health, do you have any difficulty performing the following activities: 08/30/2020 04/12/2020  Hearing? N N  Vision? N N  Difficulty concentrating or making decisions? N N  Walking or climbing stairs? N N  Dressing or bathing? N N  Doing errands, shopping? N N  Preparing Food and eating ? N -  Using the Toilet? N -  In the past six months, have you accidently leaked urine? N -  Do you have problems with loss of bowel control? N -  Managing your Medications? N -  Managing your Finances? N -  Housekeeping or managing your Housekeeping? N -  Some recent data might be hidden     Cognitive Testing  Alert? Yes  Normal Appearance?Yes  Oriented to person? Yes  Place? Yes   Time? Yes  Recall of three objects?  Yes  Can perform simple calculations? Yes  Displays appropriate judgment?Yes  Can read the correct time from a watch face?Yes  EOL planning: Does Patient Have a Medical Advance Directive?: Yes Type of Advance Directive: Healthcare Power of Attorney,Living will Does patient want to make changes to medical advance  directive?: No - Patient declined Copy of Healthcare Power of Attorney in Chart?: No - copy requested   Objective:     Blood pressure 126/88, pulse 64, temperature 97.7 F (36.5 C), height 5\' 3"  (1.6 m), weight 140 lb (63.5 kg), SpO2 95 %. Body mass index is 24.8 kg/m.  General appearance: alert, no distress, WD/WN,  female HEENT: normocephalic, sclerae anicteric, TMs pearly, nares patent, no discharge or erythema, pharynx normal Oral cavity: MMM, no lesions Neck: supple, no lymphadenopathy, no thyromegaly, no masses Heart: RRR, normal S1, S2, no murmurs Lungs: CTA bilaterally, no wheezes, rhonchi, or rales Abdomen: +bs, soft, non tender, non distended, no masses, no hepatomegaly, no splenomegaly Musculoskeletal: nontender,  no swelling, no obvious deformity Extremities: no edema, no cyanosis, no clubbing Pulses: 2+ symmetric, upper and lower extremities, normal cap refill Neurological: alert, oriented x 3, CN2-12 intact, strength normal upper extremities and lower extremities, sensation normal throughout, DTRs 2+ throughout, no cerebellar signs, gait normal Psychiatric: normal affect, behavior normal, pleasant  Breast: defer Gyn: defer Rectal: defer  Medicare Attestation I have personally reviewed: The patient's medical and social history Their use of alcohol, tobacco or illicit drugs Their current medications and supplements The patient's functional ability including ADLs,fall risks, home safety risks, cognitive, and hearing and visual impairment Diet and physical activities Evidence for depression or mood disorders  The patient's weight, height, BMI, and visual acuity have been recorded in the chart.  I have made referrals, counseling, and provided education to the patient based on review of the above and I have provided the patient with a written personalized care plan for preventive services.     Garnet Sierras, NP   08/30/2020

## 2020-08-30 NOTE — Patient Instructions (Addendum)
Today you received your Tdap vaccination 08/30/20.     Isabel Oliver , Thank you for taking time to come for your Medicare Wellness Visit. I appreciate your ongoing commitment to your health goals. Please review the following plan we discussed and let me know if I can assist you in the future.     This is a list of the screening recommended for you and due dates:  Health Maintenance  Topic Date Due    Hepatitis C: One time screening is recommended by Center for Disease Control  (CDC) for  adults born from [birth year] through 1964.   Never done   COVID-19 Vaccine (2 - Moderna 3-dose booster series) 11/16/2019   Tetanus Vaccine  12/06/2019   Colon Cancer Screening  03/31/2023   Flu Shot  Completed   DEXA scan (bone density measurement)  Completed   Pneumonia vaccines  Completed      GENERAL HEALTH GOALS  Know what a healthy weight is for you (roughly BMI <25) and aim to maintain this  Aim for 7+ servings of fruits and vegetables daily  70-80+ fluid ounces of water or unsweet tea for healthy kidneys  Limit to max 1 drink of alcohol per day; avoid smoking/tobacco  Limit animal fats in diet for cholesterol and heart health - choose grass fed whenever available  Avoid highly processed foods, and foods high in saturated/trans fats  Aim for low stress - take time to unwind and care for your mental health  Aim for 150 min of moderate intensity exercise weekly for heart health, and weights twice weekly for bone health  Aim for 7-9 hours of sleep daily

## 2020-08-31 LAB — COMPLETE METABOLIC PANEL WITH GFR
AG Ratio: 1.8 (calc) (ref 1.0–2.5)
ALT: 14 U/L (ref 6–29)
AST: 16 U/L (ref 10–35)
Albumin: 4.3 g/dL (ref 3.6–5.1)
Alkaline phosphatase (APISO): 70 U/L (ref 37–153)
BUN: 19 mg/dL (ref 7–25)
CO2: 28 mmol/L (ref 20–32)
Calcium: 9.8 mg/dL (ref 8.6–10.4)
Chloride: 106 mmol/L (ref 98–110)
Creat: 0.71 mg/dL (ref 0.60–0.93)
GFR, Est African American: 95 mL/min/{1.73_m2} (ref >=60)
GFR, Est Non African American: 82 mL/min/{1.73_m2} (ref >=60)
Globulin: 2.4 g/dL (calc) (ref 1.9–3.7)
Glucose, Bld: 88 mg/dL (ref 65–99)
Potassium: 4.6 mmol/L (ref 3.5–5.3)
Sodium: 140 mmol/L (ref 135–146)
Total Bilirubin: 0.4 mg/dL (ref 0.2–1.2)
Total Protein: 6.7 g/dL (ref 6.1–8.1)

## 2020-08-31 LAB — CBC WITH DIFFERENTIAL/PLATELET
Absolute Monocytes: 568 cells/uL (ref 200–950)
Basophils Absolute: 40 cells/uL (ref 0–200)
Basophils Relative: 0.6 %
Eosinophils Absolute: 99 cells/uL (ref 15–500)
Eosinophils Relative: 1.5 %
HCT: 37.5 % (ref 35.0–45.0)
Hemoglobin: 12.7 g/dL (ref 11.7–15.5)
Lymphs Abs: 1802 cells/uL (ref 850–3900)
MCH: 30.5 pg (ref 27.0–33.0)
MCHC: 33.9 g/dL (ref 32.0–36.0)
MCV: 89.9 fL (ref 80.0–100.0)
MPV: 9.6 fL (ref 7.5–12.5)
Monocytes Relative: 8.6 %
Neutro Abs: 4092 cells/uL (ref 1500–7800)
Neutrophils Relative %: 62 %
Platelets: 270 10*3/uL (ref 140–400)
RBC: 4.17 10*6/uL (ref 3.80–5.10)
RDW: 12.6 % (ref 11.0–15.0)
Total Lymphocyte: 27.3 %
WBC: 6.6 10*3/uL (ref 3.8–10.8)

## 2020-08-31 LAB — LIPID PANEL
Cholesterol: 146 mg/dL (ref <200)
HDL: 58 mg/dL (ref >=50)
LDL Cholesterol (Calc): 69 mg/dL (calc)
Non-HDL Cholesterol (Calc): 88 mg/dL (calc) (ref <130)
Total CHOL/HDL Ratio: 2.5 (calc) (ref <5.0)
Triglycerides: 103 mg/dL (ref <150)

## 2020-08-31 LAB — TSH: TSH: 0.14 mIU/L — ABNORMAL LOW (ref 0.40–4.50)

## 2020-09-08 ENCOUNTER — Other Ambulatory Visit: Payer: Self-pay | Admitting: Internal Medicine

## 2020-09-11 ENCOUNTER — Other Ambulatory Visit: Payer: Self-pay | Admitting: Adult Health

## 2020-10-16 IMAGING — MG DIGITAL SCREENING BILAT W/ TOMO W/ CAD
8 series · 9 of 24 positions shown · non-contrast
Comparison: Previous exam(s).

CLINICAL DATA: Screening.

EXAM:
DIGITAL SCREENING BILATERAL MAMMOGRAM WITH TOMO AND CAD

[L CC synth-2D]
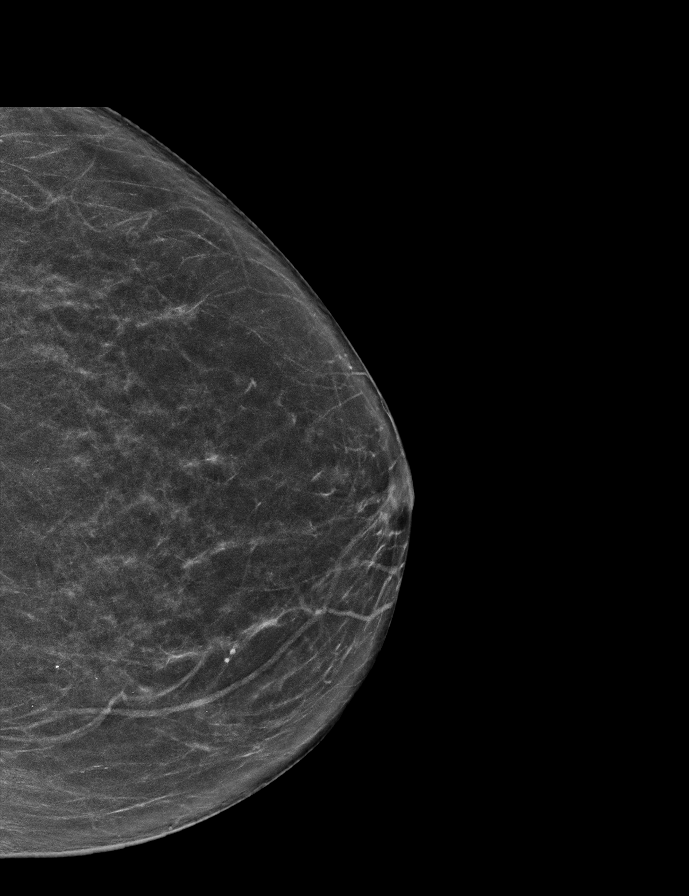

[R CC synth-2D]
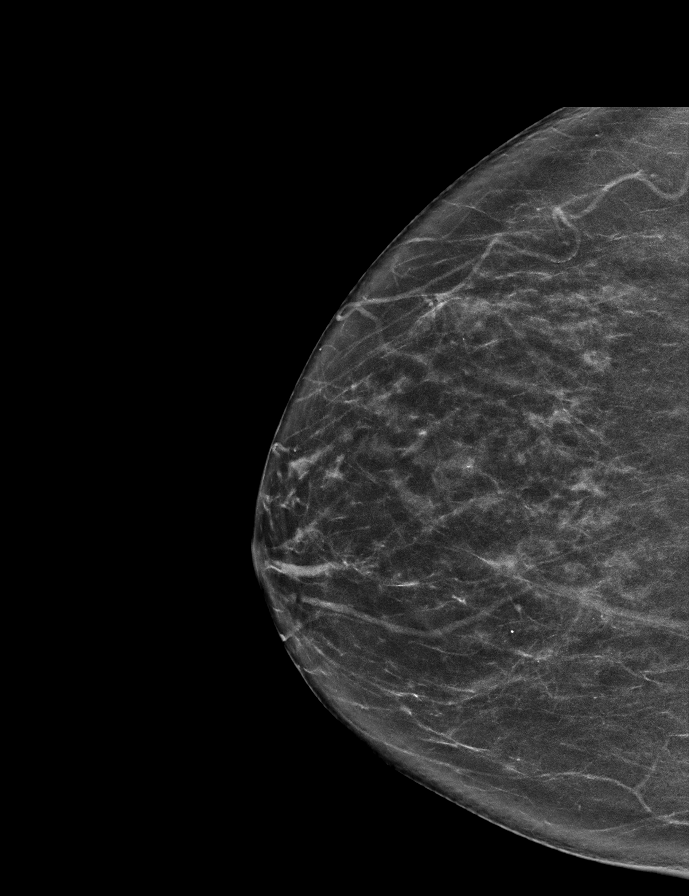

[R MLO synth-2D]
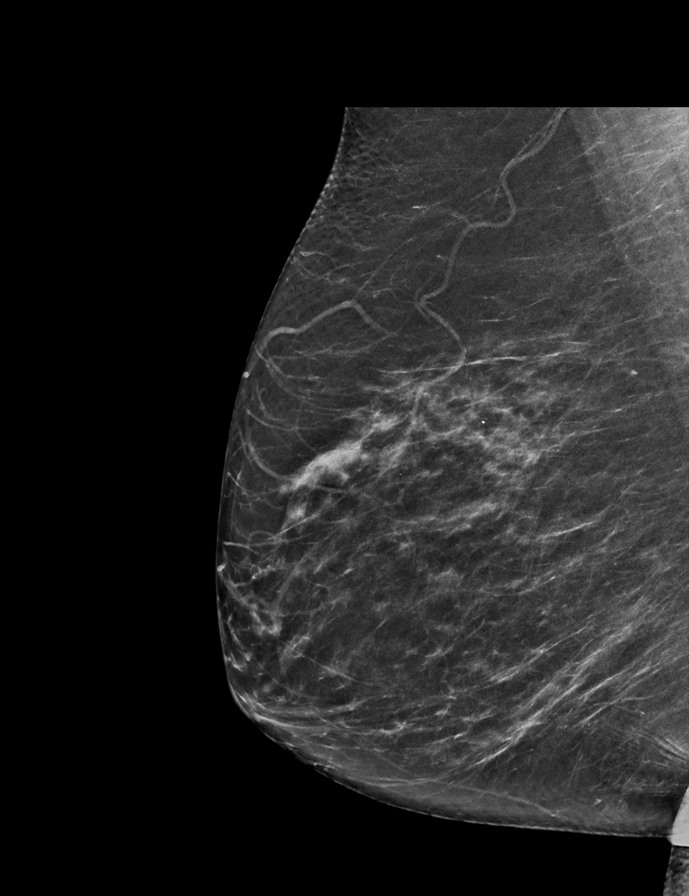

[L MLO synth-2D]
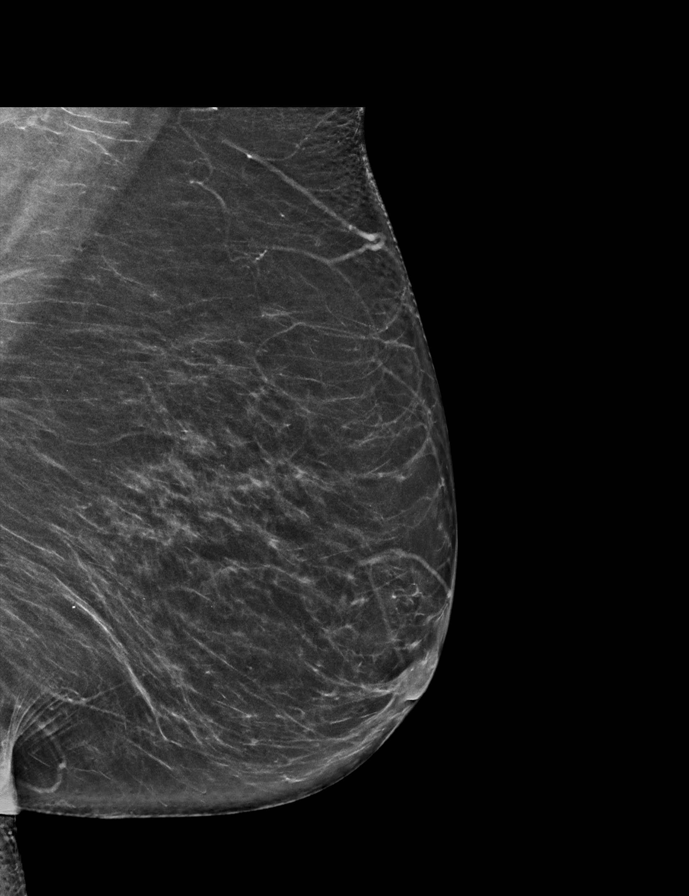

[L MLO tomo · 2 of 68 frames shown]
[frame 22/68]
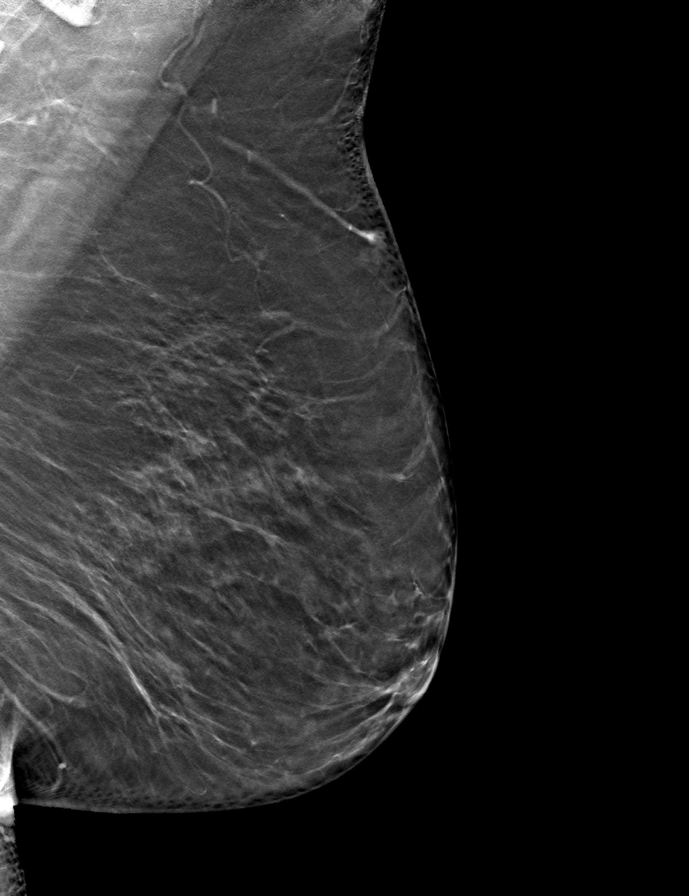
[frame 35/68]
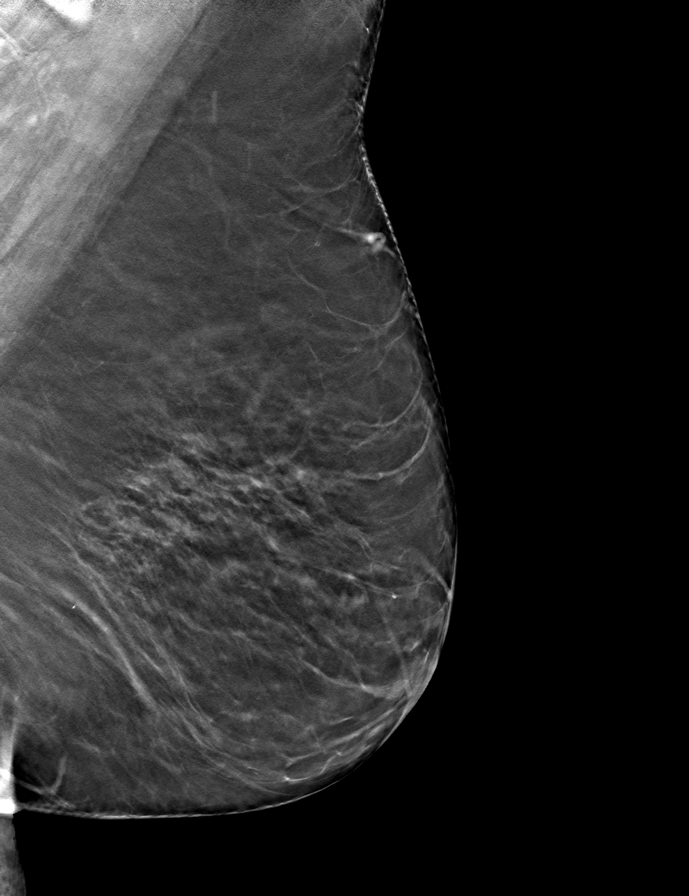

[L CC tomo · tomo slice 31/61.0]
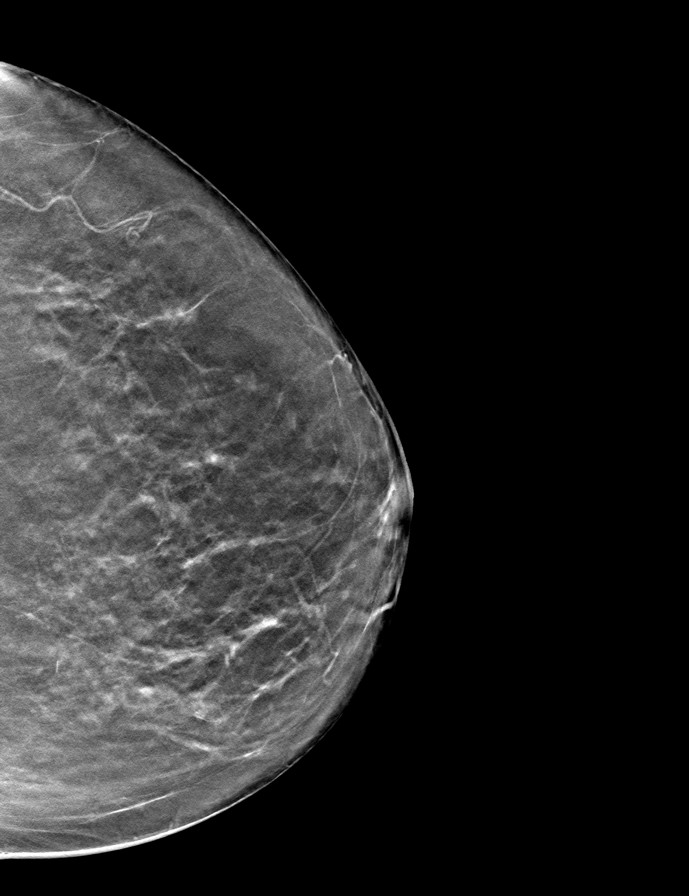

[R CC tomo · tomo slice 32/63.0]
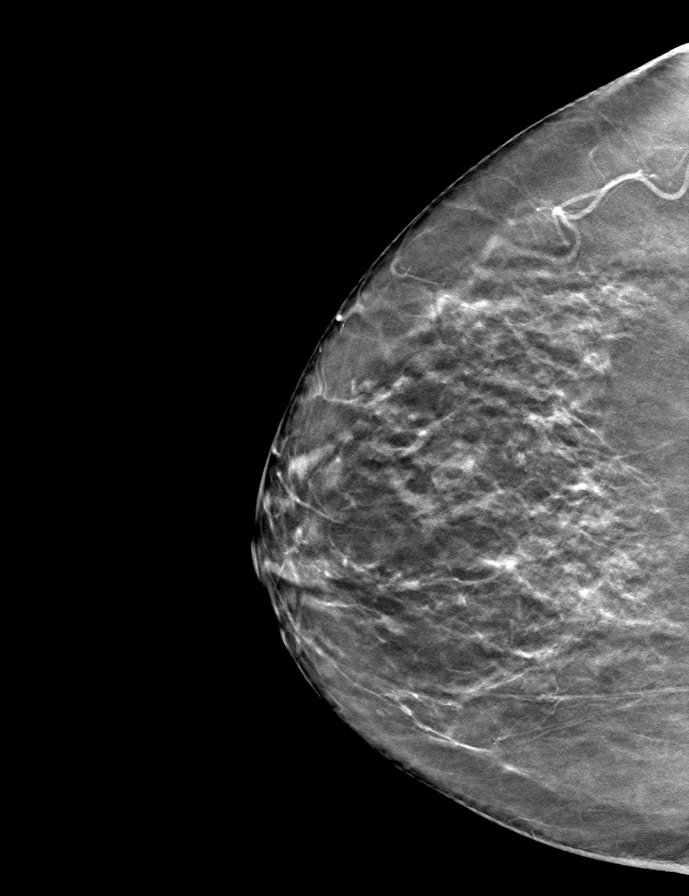

[R MLO tomo · tomo slice 33/64.0]
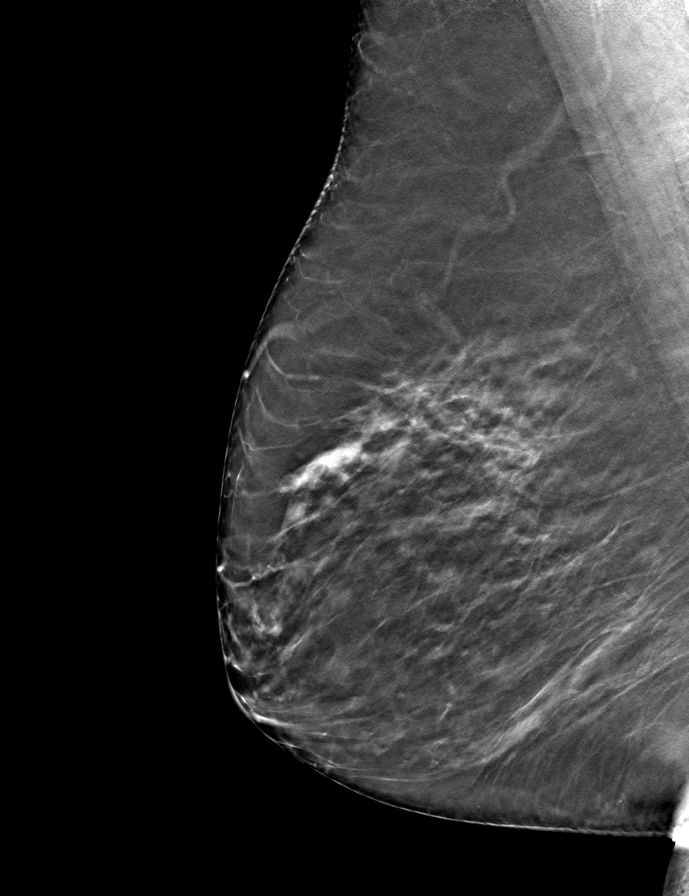

[9 of 24 positions shown; findings below may reference images not displayed]

ACR Breast Density Category b: There are scattered areas of
fibroglandular density.
FINDINGS: There are no findings suspicious for malignancy. Images were
processed with CAD.
IMPRESSION: No mammographic evidence of malignancy. A result letter of this
screening mammogram will be mailed directly to the patient.

RECOMMENDATION:
Screening mammogram in one year. (Code:[id number])

BI-RADS CATEGORY  1: Negative.

## 2020-10-25 ENCOUNTER — Other Ambulatory Visit: Payer: Self-pay | Admitting: Internal Medicine

## 2020-10-25 DIAGNOSIS — Z1231 Encounter for screening mammogram for malignant neoplasm of breast: Secondary | ICD-10-CM

## 2020-11-02 ENCOUNTER — Other Ambulatory Visit: Payer: Self-pay

## 2020-11-02 ENCOUNTER — Ambulatory Visit
Admission: RE | Admit: 2020-11-02 | Discharge: 2020-11-02 | Disposition: A | Discharge status: Home / Self Care | Payer: Medicare Other | Source: Ambulatory Visit | Attending: Internal Medicine | Admitting: Internal Medicine

## 2020-11-02 DIAGNOSIS — Z1231 Encounter for screening mammogram for malignant neoplasm of breast: Secondary | ICD-10-CM | POA: Diagnosis not present

## 2020-11-25 NOTE — Progress Notes (Signed)
History of Present Illness:       This very nice 80 y.o.WWF presents for 6 month follow up with HTN, HLD, Pre-Diabetes and Vitamin D Deficiency.  Patient's GERD is controlled on her Prilosec.        Patient is treated for HTN & BP has been controlled at home. Today's BP is at goal - 132/62. Patient has had no complaints of any cardiac type chest pain, palpitations, dyspnea / orthopnea / PND, dizziness, claudication, or dependent edema.       Hyperlipidemia is controlled with diet & Rosuvastatin. Patient denies myalgias or other med SE's. Last Lipids were at goal:  Lab Results  Component Value Date   CHOL 146 08/30/2020   HDL 58 08/30/2020   LDLCALC 69 08/30/2020   TRIG 103 08/30/2020   CHOLHDL 2.5 08/30/2020     Also, the patient has history of PreDiabetes (A1c 6.0% /2014) and has had no symptoms of reactive hypoglycemia, diabetic polys, paresthesias or visual blurring.  Last A1c was normal & at goal:  Lab Results  Component Value Date   HGBA1C 5.4 04/13/2020            Further, the patient also has history of Vitamin D Deficiency and supplements vitamin D without any suspected side-effects. Last vitamin D was not at goal  (70-100):  Lab Results  Component Value Date   VD25OH 49 04/13/2020    Current Outpatient Medications on File Prior to Visit  Medication Sig  . albuterol HFA  inhaler Use 2 Inhalations every 4 Hrs to Rescue Asthma  . VITAMIN C 500 MG CAPS Take 1 capsule  daily.  Marland Kitchen aspirin  tablet Take  daily.  . Calcium - vit D  1000-800 MG-UNIT  Take daily  . cetirizine (ZYRTEC) 10 MG tablet Take 1 daily.  Marland Kitchen VITAMIN D 2000 UNITS  Take daily  . cyanocobalamin 500 MCG tablet Take  daily.  . diclofenac  1 % GEL APPLY 2 GRAMS FOUR TIMES DAILY  . LOMOTIL 2.5-0.025 MG tablet Take 1 to 2 tablets every 4 hours if needed for Diarrhea  . Magnesium 500 MG TABS Take 1 tablet  daily.  . montelukast 10 MG tablet Take 1 tablet Daily  . Multiple Vitamins-Minerals  Take   daily.  . Omega-3 FISH OIL  Take  daily.  Marland Kitchen omeprazole 20 MG capsule Take 1 capsule Daily   . Probiotic  Take  daily.  . rosuvastatin  20 MG tablet Take 1 tablet Daily   . verapamil -SR 120 MG Take 1 tablet  at bedtime.  Marland Kitchen zinc  50 MG tablet Take 5 daily.     Allergies  Allergen Reactions  . Adhesive [Tape] Hives  . Codeine Hives  . Fosamax [Alendronate Sodium] Reflux increase  . Penicillins Hives  . Shellfish Allergy / Iodine Rash    PMHx:   Past Medical History:  Diagnosis Date  . Anemia   . Asthma   . GERD (gastroesophageal reflux disease)   . Goiter   . Hyperlipidemia   . Hypertension   . MVP (mitral valve prolapse)   . Osteopenia   . Other abnormal glucose   . Positive colorectal cancer screening using Cologuard test 08/11/2017    Immunization History  Administered Date(s) Administered  . Influenza, High Dose Seasonal PF 06/20/2017, 05/19/2019, 06/06/2020  . Influenza-Unspecified 06/28/2013, 07/06/2015, 06/09/2018  . Moderna Sars-Covid-2 Vaccination 09/30/2019, 10/19/2019  . Pneumococcal Conjugate-13 02/01/2014  . Pneumococcal-Unspecified 09/10/2007  .  Tdap 12/05/2009, 08/30/2020  . Zoster 09/09/2005    Past Surgical History:  Procedure Laterality Date  . APPENDECTOMY    . CHOLECYSTECTOMY    . EYE SURGERY    . TONSILLECTOMY AND ADENOIDECTOMY      FHx:    Reviewed / unchanged  SHx:    Reviewed / unchanged   Systems Review:  Constitutional: Denies fever, chills, wt changes, headaches, insomnia, fatigue, night sweats, change in appetite. Eyes: Denies redness, blurred vision, diplopia, discharge, itchy, watery eyes.  ENT: Denies discharge, congestion, post nasal drip, epistaxis, sore throat, earache, hearing loss, dental pain, tinnitus, vertigo, sinus pain, snoring.  CV: Denies chest pain, palpitations, irregular heartbeat, syncope, dyspnea, diaphoresis, orthopnea, PND, claudication or edema. Respiratory: denies cough, dyspnea, DOE, pleurisy, hoarseness,  laryngitis, wheezing.  Gastrointestinal: Denies dysphagia, odynophagia, heartburn, reflux, water brash, abdominal pain or cramps, nausea, vomiting, bloating, diarrhea, constipation, hematemesis, melena, hematochezia  or hemorrhoids. Genitourinary: Denies dysuria, frequency, urgency, nocturia, hesitancy, discharge, hematuria or flank pain. Musculoskeletal: Denies arthralgias, myalgias, stiffness, jt. swelling, pain, limping or strain/sprain.  Skin: Denies pruritus, rash, hives, warts, acne, eczema or change in skin lesion(s). Neuro: No weakness, tremor, incoordination, spasms, paresthesia or pain. Psychiatric: Denies confusion, memory loss or sensory loss. Endo: Denies change in weight, skin or hair change.  Heme/Lymph: No excessive bleeding, bruising or enlarged lymph nodes.  Physical Exam  BP 132/62   Pulse 65   Temp 97.7 F (36.5 C)   Ht 5' 3.5" (1.613 m)   Wt 142 lb (64.4 kg)   BMI 24.76 kg/m   Appears  well nourished, well groomed  and in no distress.  Eyes: PERRLA, EOMs, conjunctiva no swelling or erythema. Sinuses: No frontal/maxillary tenderness ENT/Mouth: EAC's clear, TM's nl w/o erythema, bulging. Nares clear w/o erythema, swelling, exudates. Oropharynx clear without erythema or exudates. Oral hygiene is good. Tongue normal, non obstructing. Hearing intact.  Neck: Supple. Thyroid not palpable. Car 2+/2+ without bruits, nodes or JVD. Chest: Respirations nl with BS clear & equal w/o rales, rhonchi, wheezing or stridor.  Cor: Heart sounds normal w/ regular rate and rhythm without sig. murmurs, gallops, clicks or rubs. Peripheral pulses normal and equal  without edema.  Abdomen: Soft & bowel sounds normal. Non-tender w/o guarding, rebound, hernias, masses or organomegaly.  Lymphatics: Unremarkable.  Musculoskeletal: Full ROM all peripheral extremities, joint stability, 5/5 strength and normal gait.  Skin: Warm, dry without exposed rashes, lesions or ecchymosis apparent.  Neuro:  Cranial nerves intact, reflexes equal bilaterally. Sensory-motor testing grossly intact. Tendon reflexes grossly intact.  Pysch: Alert & oriented x 3.  Insight and judgement nl & appropriate. No ideations.  Assessment and Plan:  1. Essential hypertension  - Continue medication, monitor blood pressure at home.  - Continue DASH diet.  Reminder to go to the ER if any CP,  SOB, nausea, dizziness, severe HA, changes vision/speech.  - CBC with Differential/Platelet - COMPLETE METABOLIC PANEL WITH GFR - Magnesium - TSH  2. Hyperlipidemia, mixed  - Continue diet/meds, exercise,& lifestyle modifications.  - Continue monitor periodic cholesterol/liver & renal functions   - Lipid panel - TSH  3. Abnormal glucose  - Continue diet, exercise  - Lifestyle modifications.  - Monitor appropriate labs  - Hemoglobin A1c - Insulin, random  4. Vitamin D deficiency  - Continue supplementation.  - VITAMIN D 25 Hydroxyl  5. Gastroesophageal reflux disease without esophagitis  - CBC with Differential/Platelet  6. Medication management  - CBC with Differential/Platelet - COMPLETE METABOLIC PANEL WITH GFR -  Magnesium - Lipid panel - TSH - Hemoglobin A1c - Insulin, random - VITAMIN D 25 Hydroxyl        Discussed  regular exercise, BP monitoring, weight control to achieve/maintain BMI less than 25 and discussed med and SE's. Recommended labs to assess and monitor clinical status with further disposition pending results of labs.  I discussed the assessment and treatment plan with the patient. The patient was provided an opportunity to ask questions and all were answered. The patient agreed with the plan and demonstrated an understanding of the instructions.  I provided over 30 minutes of exam, counseling, chart review and  complex critical decision making.       The patient was advised to call back or seek an in-person evaluation if the symptoms worsen or if the condition fails to improve  as anticipated.   Kirtland Bouchard, MD

## 2020-11-27 ENCOUNTER — Encounter: Payer: Self-pay | Admitting: Internal Medicine

## 2020-11-27 NOTE — Patient Instructions (Signed)

## 2020-11-28 ENCOUNTER — Other Ambulatory Visit: Payer: Self-pay

## 2020-11-28 ENCOUNTER — Ambulatory Visit (INDEPENDENT_AMBULATORY_CARE_PROVIDER_SITE_OTHER): Payer: Medicare Other | Admitting: Internal Medicine

## 2020-11-28 VITALS — BP 132/62 | HR 65 | Temp 97.7°F | Ht 63.5 in | Wt 142.0 lb

## 2020-11-28 DIAGNOSIS — I10 Essential (primary) hypertension: Secondary | ICD-10-CM

## 2020-11-28 DIAGNOSIS — R7309 Other abnormal glucose: Secondary | ICD-10-CM | POA: Diagnosis not present

## 2020-11-28 DIAGNOSIS — K219 Gastro-esophageal reflux disease without esophagitis: Secondary | ICD-10-CM | POA: Diagnosis not present

## 2020-11-28 DIAGNOSIS — E782 Mixed hyperlipidemia: Secondary | ICD-10-CM

## 2020-11-28 DIAGNOSIS — E559 Vitamin D deficiency, unspecified: Secondary | ICD-10-CM | POA: Diagnosis not present

## 2020-11-28 DIAGNOSIS — Z79899 Other long term (current) drug therapy: Secondary | ICD-10-CM

## 2020-11-29 ENCOUNTER — Other Ambulatory Visit: Payer: Self-pay | Admitting: Internal Medicine

## 2020-11-29 DIAGNOSIS — E0590 Thyrotoxicosis, unspecified without thyrotoxic crisis or storm: Secondary | ICD-10-CM

## 2020-11-29 LAB — LIPID PANEL
Cholesterol: 163 mg/dL (ref <200)
HDL: 69 mg/dL (ref >=50)
LDL Cholesterol (Calc): 77 mg/dL (calc)
Non-HDL Cholesterol (Calc): 94 mg/dL (calc) (ref <130)
Total CHOL/HDL Ratio: 2.4 (calc) (ref <5.0)
Triglycerides: 86 mg/dL (ref <150)

## 2020-11-29 LAB — CBC WITH DIFFERENTIAL/PLATELET
Absolute Monocytes: 549 cells/uL (ref 200–950)
Basophils Absolute: 41 cells/uL (ref 0–200)
Basophils Relative: 0.9 %
Eosinophils Absolute: 140 cells/uL (ref 15–500)
Eosinophils Relative: 3.1 %
HCT: 39.9 % (ref 35.0–45.0)
Hemoglobin: 12.9 g/dL (ref 11.7–15.5)
Lymphs Abs: 1535 cells/uL (ref 850–3900)
MCH: 29.3 pg (ref 27.0–33.0)
MCHC: 32.3 g/dL (ref 32.0–36.0)
MCV: 90.7 fL (ref 80.0–100.0)
MPV: 9.6 fL (ref 7.5–12.5)
Monocytes Relative: 12.2 %
Neutro Abs: 2237 cells/uL (ref 1500–7800)
Neutrophils Relative %: 49.7 %
Platelets: 274 10*3/uL (ref 140–400)
RBC: 4.4 10*6/uL (ref 3.80–5.10)
RDW: 12.7 % (ref 11.0–15.0)
Total Lymphocyte: 34.1 %
WBC: 4.5 10*3/uL (ref 3.8–10.8)

## 2020-11-29 LAB — COMPLETE METABOLIC PANEL WITH GFR
AG Ratio: 1.7 (calc) (ref 1.0–2.5)
ALT: 14 U/L (ref 6–29)
AST: 17 U/L (ref 10–35)
Albumin: 4.2 g/dL (ref 3.6–5.1)
Alkaline phosphatase (APISO): 75 U/L (ref 37–153)
BUN: 16 mg/dL (ref 7–25)
CO2: 30 mmol/L (ref 20–32)
Calcium: 10.2 mg/dL (ref 8.6–10.4)
Chloride: 104 mmol/L (ref 98–110)
Creat: 0.71 mg/dL (ref 0.60–0.93)
GFR, Est African American: 95 mL/min/{1.73_m2} (ref >=60)
GFR, Est Non African American: 82 mL/min/{1.73_m2} (ref >=60)
Globulin: 2.5 g/dL (calc) (ref 1.9–3.7)
Glucose, Bld: 74 mg/dL (ref 65–99)
Potassium: 4.6 mmol/L (ref 3.5–5.3)
Sodium: 141 mmol/L (ref 135–146)
Total Bilirubin: 0.4 mg/dL (ref 0.2–1.2)
Total Protein: 6.7 g/dL (ref 6.1–8.1)

## 2020-11-29 LAB — HEMOGLOBIN A1C
Hgb A1c MFr Bld: 5.5 % of total Hgb (ref <5.7)
Mean Plasma Glucose: 111 mg/dL
eAG (mmol/L): 6.2 mmol/L

## 2020-11-29 LAB — INSULIN, RANDOM: Insulin: 12.3 u[IU]/mL

## 2020-11-29 LAB — MAGNESIUM: Magnesium: 2.1 mg/dL (ref 1.5–2.5)

## 2020-11-29 LAB — TSH: TSH: 0.2 mIU/L — ABNORMAL LOW (ref 0.40–4.50)

## 2020-11-29 LAB — VITAMIN D 25 HYDROXY (VIT D DEFICIENCY, FRACTURES): Vit D, 25-Hydroxy: 73 ng/mL (ref 30–100)

## 2020-11-29 NOTE — Progress Notes (Signed)
============================================================ -   Test results slightly outside the reference range are not unusual. If there is anything important, I will review this with you,  otherwise it is considered normal test values.  If you have further questions,  please do not hesitate to contact me at the office or via My Chart.  ============================================================ ============================================================  -  Total Chol = 163  and LDL Chol = 77 - Both  Excellent   - Very low risk for Heart Attack  / Stroke ============================================================ ============================================================  - TSH still very low from elevated Thyroid hormone level   - suggest repeat thyroid scan & uptake - will reorder thyroid scan  ============================================================ ============================================================  - A1c 5.5% - Normal - No Diabetes - Great ! ============================================================ ============================================================  -  Vitamin D = 73 - Excellent  ============================================================ ============================================================  - All Else - CBC - Kidneys - Electrolytes - Liver - Magnesium & Thyroid    - all  Normal / OK ============================================================ ============================================================

## 2020-11-30 ENCOUNTER — Other Ambulatory Visit: Payer: Self-pay | Admitting: Internal Medicine

## 2020-11-30 DIAGNOSIS — E0590 Thyrotoxicosis, unspecified without thyrotoxic crisis or storm: Secondary | ICD-10-CM

## 2020-11-30 DIAGNOSIS — E042 Nontoxic multinodular goiter: Secondary | ICD-10-CM

## 2020-12-06 ENCOUNTER — Other Ambulatory Visit: Payer: Self-pay | Admitting: Adult Health

## 2020-12-06 ENCOUNTER — Other Ambulatory Visit: Payer: Self-pay | Admitting: Internal Medicine

## 2020-12-07 ENCOUNTER — Other Ambulatory Visit: Payer: Self-pay | Admitting: Internal Medicine

## 2020-12-07 DIAGNOSIS — K219 Gastro-esophageal reflux disease without esophagitis: Secondary | ICD-10-CM

## 2020-12-07 MED ORDER — OMEPRAZOLE 20 MG PO CPDR: DELAYED_RELEASE_CAPSULE | ORAL | 3 refills | Status: DC

## 2020-12-19 ENCOUNTER — Encounter (HOSPITAL_COMMUNITY)
Admission: RE | Admit: 2020-12-19 | Discharge: 2020-12-19 | Disposition: A | Discharge status: Home / Self Care | Payer: Medicare Other | Source: Ambulatory Visit | Attending: Internal Medicine | Admitting: Internal Medicine

## 2020-12-19 ENCOUNTER — Other Ambulatory Visit: Payer: Self-pay

## 2020-12-19 ENCOUNTER — Ambulatory Visit (HOSPITAL_COMMUNITY)
Admission: RE | Admit: 2020-12-19 | Discharge: 2020-12-19 | Disposition: A | Discharge status: Home / Self Care | Payer: Medicare Other | Source: Ambulatory Visit | Attending: Internal Medicine | Admitting: Internal Medicine

## 2020-12-19 DIAGNOSIS — E0590 Thyrotoxicosis, unspecified without thyrotoxic crisis or storm: Secondary | ICD-10-CM

## 2020-12-19 DIAGNOSIS — E042 Nontoxic multinodular goiter: Secondary | ICD-10-CM

## 2020-12-19 MED ORDER — SODIUM IODIDE I-123 7.4 MBQ CAPS
495.0000 | ORAL_CAPSULE | Freq: Once | ORAL | Status: AC
  Administered 2020-12-19: 495 via ORAL

## 2020-12-20 ENCOUNTER — Other Ambulatory Visit (HOSPITAL_COMMUNITY): Payer: Medicare Other

## 2020-12-20 ENCOUNTER — Ambulatory Visit (HOSPITAL_COMMUNITY): Payer: Medicare Other

## 2020-12-20 ENCOUNTER — Other Ambulatory Visit: Payer: Self-pay | Admitting: Internal Medicine

## 2020-12-20 ENCOUNTER — Encounter (HOSPITAL_COMMUNITY)
Admission: RE | Admit: 2020-12-20 | Discharge: 2020-12-20 | Disposition: A | Discharge status: Home / Self Care | Payer: Medicare Other | Source: Ambulatory Visit | Attending: Internal Medicine | Admitting: Internal Medicine

## 2020-12-20 DIAGNOSIS — E0590 Thyrotoxicosis, unspecified without thyrotoxic crisis or storm: Secondary | ICD-10-CM

## 2020-12-20 DIAGNOSIS — D34 Benign neoplasm of thyroid gland: Secondary | ICD-10-CM

## 2020-12-20 DIAGNOSIS — E042 Nontoxic multinodular goiter: Secondary | ICD-10-CM | POA: Diagnosis not present

## 2020-12-20 NOTE — Progress Notes (Signed)
============================================================= =============================================================  -   Thyroid scan Now finally shows the problem.   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

## 2020-12-21 ENCOUNTER — Other Ambulatory Visit (HOSPITAL_COMMUNITY): Payer: Medicare Other

## 2021-01-03 ENCOUNTER — Other Ambulatory Visit: Payer: Self-pay | Admitting: Internal Medicine

## 2021-01-03 DIAGNOSIS — E782 Mixed hyperlipidemia: Secondary | ICD-10-CM

## 2021-01-03 MED ORDER — ROSUVASTATIN CALCIUM 20 MG PO TABS: ORAL_TABLET | ORAL | 3 refills | Status: DC

## 2021-01-19 ENCOUNTER — Other Ambulatory Visit: Payer: Self-pay | Admitting: Internal Medicine

## 2021-01-29 ENCOUNTER — Other Ambulatory Visit: Payer: Self-pay | Admitting: Internal Medicine

## 2021-01-29 ENCOUNTER — Encounter (HOSPITAL_COMMUNITY)
Admission: RE | Admit: 2021-01-29 | Discharge: 2021-01-29 | Disposition: A | Discharge status: Home / Self Care | Payer: Medicare Other | Source: Ambulatory Visit | Attending: Internal Medicine | Admitting: Internal Medicine

## 2021-01-29 ENCOUNTER — Other Ambulatory Visit: Payer: Self-pay

## 2021-01-29 DIAGNOSIS — E0590 Thyrotoxicosis, unspecified without thyrotoxic crisis or storm: Secondary | ICD-10-CM

## 2021-01-29 DIAGNOSIS — D34 Benign neoplasm of thyroid gland: Secondary | ICD-10-CM | POA: Diagnosis not present

## 2021-01-29 MED ORDER — SODIUM IODIDE I 131 CAPSULE
38.0000 | Freq: Once | INTRAVENOUS | Status: AC | PRN
  Administered 2021-01-29: 38 via ORAL

## 2021-01-29 NOTE — Progress Notes (Signed)
============================================================ ============================================================  -   Now treated for the Overactive  or hyperfunctioning thyroid nodule,   suggest schedule a nurse visit in 1 month to recheck your TSH.  ============================================================ ============================================================

## 2021-03-01 ENCOUNTER — Other Ambulatory Visit: Payer: Self-pay

## 2021-03-01 ENCOUNTER — Ambulatory Visit (INDEPENDENT_AMBULATORY_CARE_PROVIDER_SITE_OTHER): Payer: Medicare Other

## 2021-03-01 DIAGNOSIS — E0590 Thyrotoxicosis, unspecified without thyrotoxic crisis or storm: Secondary | ICD-10-CM

## 2021-03-01 LAB — TSH: TSH: 12.66 mIU/L — ABNORMAL HIGH (ref 0.40–4.50)

## 2021-03-01 NOTE — Progress Notes (Signed)
Patient presents to the office for a nurse visit to have labs done to check TSH levels, recently had Radioactive Iodine therapy performed. No questions or concerns. Vitals taken and recorded.

## 2021-03-02 ENCOUNTER — Other Ambulatory Visit: Payer: Self-pay | Admitting: Internal Medicine

## 2021-03-02 DIAGNOSIS — E890 Postprocedural hypothyroidism: Secondary | ICD-10-CM

## 2021-03-02 MED ORDER — LEVOTHYROXINE SODIUM 100 MCG PO TABS: ORAL_TABLET | ORAL | 0 refills | Status: DC

## 2021-03-02 NOTE — Progress Notes (Signed)
============================================================ -   Test results slightly outside the reference range are not unusual. If there is anything important, I will review this with you,  otherwise it is considered normal test values.  If you have further questions,  please do not hesitate to contact me at the office or via My Chart.  ============================================================ ============================================================  -  TSH is elevated which means                       Thyroid Hormone is now low in blood after                                                                                                                         treatment with Radioactive Iodine   - So, it's time to begin thyroid replacement therapy. New Rx for                                                                   Thyroid med sent to your Drugstore.    - As discussed, take 1st thing in the Morning on an empty stomach with only a glass of water for 30 minutes   -   & No Antacid meds, Calcium or Magnesium for 4 hours   -   & avoid Biotin  - Will recheck at your next Office Visit on Aug 5

## 2021-03-03 ENCOUNTER — Other Ambulatory Visit: Payer: Self-pay | Admitting: Internal Medicine

## 2021-03-03 DIAGNOSIS — E890 Postprocedural hypothyroidism: Secondary | ICD-10-CM

## 2021-03-03 MED ORDER — LEVOTHYROXINE SODIUM 100 MCG PO TABS: ORAL_TABLET | ORAL | 0 refills | Status: DC

## 2021-03-06 ENCOUNTER — Other Ambulatory Visit: Payer: Self-pay | Admitting: Internal Medicine

## 2021-03-06 DIAGNOSIS — E890 Postprocedural hypothyroidism: Secondary | ICD-10-CM

## 2021-03-06 MED ORDER — LEVOTHYROXINE SODIUM 100 MCG PO TABS: ORAL_TABLET | ORAL | 3 refills | Status: DC

## 2021-04-02 ENCOUNTER — Other Ambulatory Visit: Payer: Self-pay | Admitting: Internal Medicine

## 2021-04-02 ENCOUNTER — Other Ambulatory Visit: Payer: Self-pay

## 2021-04-02 DIAGNOSIS — E890 Postprocedural hypothyroidism: Secondary | ICD-10-CM

## 2021-04-02 DIAGNOSIS — Z79899 Other long term (current) drug therapy: Secondary | ICD-10-CM

## 2021-04-03 ENCOUNTER — Ambulatory Visit (INDEPENDENT_AMBULATORY_CARE_PROVIDER_SITE_OTHER): Payer: Medicare Other

## 2021-04-03 ENCOUNTER — Other Ambulatory Visit: Payer: Self-pay

## 2021-04-03 DIAGNOSIS — Z79899 Other long term (current) drug therapy: Secondary | ICD-10-CM | POA: Diagnosis not present

## 2021-04-03 DIAGNOSIS — E890 Postprocedural hypothyroidism: Secondary | ICD-10-CM

## 2021-04-03 NOTE — Progress Notes (Signed)
Patient presents today for a nurse visit to have TSH rechecked. She is taking 100 mcg of her thyroid medication daily. Patient states that she is feeling jittery an hour after taking her medication. It will ease up in the afternoon but still has jitters.

## 2021-04-04 LAB — TSH: TSH: 0.33 mIU/L — ABNORMAL LOW (ref 0.40–4.50)

## 2021-04-04 NOTE — Progress Notes (Signed)
============================================================ -   Test results slightly outside the reference range are not unusual. If there is anything important, I will review this with you,  otherwise it is considered normal test values.  If you have further questions,  please do not hesitate to contact me at the office or via My Chart.  ============================================================ ============================================================  _ It's been 2 months since your RAI*131 treatment   -  TSH is again suppressed suggesting thyroid hormone levels are                                                                              fluctuating upwards again  and   it is not unusual to see                             TSH & Thyroid hormone levels fluctuate for up to 6 months  - So. . . .   . .Need to continue monthly TSH levels  for several more months  ============================================================ ============================================================

## 2021-04-13 ENCOUNTER — Ambulatory Visit (INDEPENDENT_AMBULATORY_CARE_PROVIDER_SITE_OTHER): Payer: Medicare Other | Admitting: Internal Medicine

## 2021-04-13 ENCOUNTER — Encounter: Payer: Self-pay | Admitting: Internal Medicine

## 2021-04-13 ENCOUNTER — Other Ambulatory Visit: Payer: Self-pay

## 2021-04-13 VITALS — BP 140/80 | HR 72 | Temp 97.8°F | Resp 16 | Ht 63.5 in | Wt 145.6 lb

## 2021-04-13 DIAGNOSIS — K219 Gastro-esophageal reflux disease without esophagitis: Secondary | ICD-10-CM

## 2021-04-13 DIAGNOSIS — I10 Essential (primary) hypertension: Secondary | ICD-10-CM

## 2021-04-13 DIAGNOSIS — Z136 Encounter for screening for cardiovascular disorders: Secondary | ICD-10-CM | POA: Diagnosis not present

## 2021-04-13 DIAGNOSIS — R7309 Other abnormal glucose: Secondary | ICD-10-CM

## 2021-04-13 DIAGNOSIS — Z0000 Encounter for general adult medical examination without abnormal findings: Secondary | ICD-10-CM

## 2021-04-13 DIAGNOSIS — E782 Mixed hyperlipidemia: Secondary | ICD-10-CM

## 2021-04-13 DIAGNOSIS — Z1211 Encounter for screening for malignant neoplasm of colon: Secondary | ICD-10-CM

## 2021-04-13 DIAGNOSIS — E890 Postprocedural hypothyroidism: Secondary | ICD-10-CM | POA: Diagnosis not present

## 2021-04-13 DIAGNOSIS — Z79899 Other long term (current) drug therapy: Secondary | ICD-10-CM

## 2021-04-13 DIAGNOSIS — Z0001 Encounter for general adult medical examination with abnormal findings: Secondary | ICD-10-CM

## 2021-04-13 DIAGNOSIS — Z1212 Encounter for screening for malignant neoplasm of rectum: Secondary | ICD-10-CM

## 2021-04-13 DIAGNOSIS — E559 Vitamin D deficiency, unspecified: Secondary | ICD-10-CM

## 2021-04-13 DIAGNOSIS — Z8249 Family history of ischemic heart disease and other diseases of the circulatory system: Secondary | ICD-10-CM | POA: Diagnosis not present

## 2021-04-13 NOTE — Patient Instructions (Signed)

## 2021-04-13 NOTE — Progress Notes (Signed)
Annual Screening/Preventative Visit & Comprehensive Evaluation &  Examination  Future Appointments  Date Time Provider [place]  04/13/2021 11:00 AM Unk Pinto, MD [facility] None  08/30/2021 11:00 AM Magda Bernheim, NP [facility] None  04/15/2022 11:00 AM Unk Pinto, MD [facility] None        This very nice 80 y.o. WWF  presents for a Screening /Preventative Visit & comprehensive evaluation and management of multiple medical co-morbidities.  Patient has been followed for HTN, HLD, Prediabetes  and Vitamin D Deficiency.        HTN predates since  1995. Patient's BP has been controlled at home and patient denies any cardiac symptoms as chest pain, palpitations, shortness of breath, dizziness or ankle swelling. Today's BP is at goal - 140/80 .       Patient's hyperlipidemia is controlled with diet and medications. Patient denies myalgias or other medication SE's. Last lipids were at goal:  Lab Results  Component Value Date   CHOL 163 11/28/2020   HDL 69 11/28/2020   LDLCALC 77 11/28/2020   TRIG 86 11/28/2020   CHOLHDL 2.4 11/28/2020         Patient has hx/o prediabetes predating (A1c 6.0% /2014) and patient denies reactive hypoglycemic symptoms, visual blurring, diabetic polys or paresthesias. Last A1c was Normal & at goal:  Lab Results  Component Value Date   HGBA1C 5.5 11/28/2020         Finally, patient has history of Vitamin D Deficiency and last Vitamin D was at goal:  Lab Results  Component Value Date   VD25OH 73 11/28/2020     Current Outpatient Medications on File Prior to Visit  Medication Sig   albuterol VENTOLIN HFA  inhaler Use  2 Inhalations  every 4 hrs as needed to Rescue Asthma Attack   VITAMIN C 500 MG CAPS Take 1 capsule daily.   aspirin 81 MG tablet Take daily.   Calcium -Cholecalciferol 1000-800  Take    cetirizine 10 MG tablet Take 1 daily.   VITAMIN D 2000 UNITS  Take daily   cyanocobalamin 500 MCG tablet Take 500 mcg  daily.    diclofenac 1 % GEL APPLY 2 GRAMS  FOUR TIMES DAILY   LOMOTIL  tablet Take 1 to 2 tablets every 4 hours if needed for Diarrhea   levothyroxine  100 MCG tablet Take  1 tablet  Daily     Magnesium 500 MG TABS Take 1 tablet  daily.   montelukast  10 MG tablet TAKE 1 TABLET  DAILY    Multiple Vitamins-Minerals Take daily.   Omega-3 FISH OIL  Take daily.   omeprazole 20 MG capsule Take  1 capsule  Daily     Probiotic  Take daily.   rosuvastatin  20 MG tablet Take  1 tablet  Daily  for Cholesterol   verapamil-SR 120 MG CR  Take 1 tablet at bedtime for blood pressure.   zinc 50 MG tablet Take daily.     Allergies  Allergen Reactions   Adhesive [Tape] Hives   Codeine Hives   Fosamax [Alendronate Sodium] Other (See Comments)    Reflux increase   Penicillins Hives   Shellfish Allergy    Iodine Rash     Past Medical History:  Diagnosis Date   Anemia    Asthma    GERD (gastroesophageal reflux disease)    Goiter    Hyperlipidemia    Hypertension    MVP (mitral valve prolapse)    Osteopenia  Other abnormal glucose    Positive colorectal cancer screening using Cologuard test 08/11/2017     Health Maintenance  Topic Date Due   Hepatitis C Screening  Never done   Zoster Vaccines- Shingrix (1 of 2) Never done   COVID-19 Vaccine (3 - Mixed Product risk series) 11/16/2019   INFLUENZA VACCINE  04/09/2021   COLONOSCOPY (Pts 45-67yr Insurance coverage will need to be confirmed)  03/31/2023   TETANUS/TDAP  08/30/2030   DEXA SCAN  Completed   PNA vac Low Risk Adult  Completed   HPV VACCINES  Aged Out     Immunization History  Administered Date(s) Administered   Influenza, High Dose Seasonal PF 07/14/2014, 05/21/2016, 06/20/2017, 05/19/2019, 06/06/2020   Influenza-Unspecified 06/28/2013, 07/06/2015, 06/09/2018   Moderna Sars-Covid-2 Vaccination 09/30/2019, 10/19/2019   Pneumococcal Conjugate-13 02/01/2014   Pneumococcal-Unspecified 09/10/2007   Tdap 12/05/2009, 08/30/2020    Zoster, Live 09/09/2005     Last Colon - 09/30/2017 - Dr JArty Baumgartnerrecc 5 yr f/u due Jan 2024.   Last MGM -  11/02/2020   Past Surgical History:  Procedure Laterality Date   APPENDECTOMY     CHOLECYSTECTOMY     EYE SURGERY     TONSILLECTOMY AND ADENOIDECTOMY       Family History  Problem Relation Age of Onset   Hypertension Mother    Diabetes Father    Heart disease Father    Hyperlipidemia Sister    Hyperlipidemia Brother    Breast cancer Neg Hx      Social History   Tobacco Use   Smoking status: Never   Smokeless tobacco: Never  Substance Use Topics   Alcohol use: No   Drug use: No      ROS Constitutional: Denies fever, chills, weight loss/gain, headaches, insomnia,  night sweats, and change in appetite. Does c/o fatigue. Eyes: Denies redness, blurred vision, diplopia, discharge, itchy, watery eyes.  ENT: Denies discharge, congestion, post nasal drip, epistaxis, sore throat, earache, hearing loss, dental pain, Tinnitus, Vertigo, Sinus pain, snoring.  Cardio: Denies chest pain, palpitations, irregular heartbeat, syncope, dyspnea, diaphoresis, orthopnea, PND, claudication, edema Respiratory: denies cough, dyspnea, DOE, pleurisy, hoarseness, laryngitis, wheezing.  Gastrointestinal: Denies dysphagia, heartburn, reflux, water brash, pain, cramps, nausea, vomiting, bloating, diarrhea, constipation, hematemesis, melena, hematochezia, jaundice, hemorrhoids Genitourinary: Denies dysuria, frequency, urgency, nocturia, hesitancy, discharge, hematuria, flank pain Breast: Breast lumps, nipple discharge, bleeding.  Musculoskeletal: Denies arthralgia, myalgia, stiffness, Jt. Swelling, pain, limp, and strain/sprain. Denies falls. Skin: Denies puritis, rash, hives, warts, acne, eczema, changing in skin lesion Neuro: No weakness, tremor, incoordination, spasms, paresthesia, pain Psychiatric: Denies confusion, memory loss, sensory loss. Denies Depression. Endocrine: Denies  change in weight, skin, hair change, nocturia, and paresthesia, diabetic polys, visual blurring, hyper / hypo glycemic episodes.  Heme/Lymph: No excessive bleeding, bruising, enlarged lymph nodes.  Physical Exam  BP 140/80   Pulse 72   Temp 97.8 F (36.6 C)   Resp 16   Ht 5' 3.5" (1.613 m)   Wt 145 lb 9.6 oz (66 kg)   SpO2 97%   BMI 25.39 kg/m   General Appearance: Well nourished, well groomed and in no apparent distress.  Eyes: PERRLA, EOMs, conjunctiva no swelling or erythema, normal fundi and vessels. Sinuses: No frontal/maxillary tenderness ENT/Mouth: EACs patent / TMs  nl. Nares clear without erythema, swelling, mucoid exudates. Oral hygiene is good. No erythema, swelling, or exudate. Tongue normal, non-obstructing. Tonsils not swollen or erythematous. Hearing normal.  Neck: Supple, thyroid not palpable. No bruits, nodes or  JVD. Respiratory: Respiratory effort normal.  BS equal and clear bilateral without rales, rhonci, wheezing or stridor. Cardio: Heart sounds are normal with regular rate and rhythm and no murmurs, rubs or gallops. Peripheral pulses are normal and equal bilaterally without edema. No aortic or femoral bruits. Chest: symmetric with normal excursions and percussion. Breasts: Symmetric, without lumps, nipple discharge, retractions, or fibrocystic changes.  Abdomen: Flat, soft with bowel sounds active. Nontender, no guarding, rebound, hernias, masses, or organomegaly.  Lymphatics: Non tender without lymphadenopathy.  Genitourinary:  Musculoskeletal: Full ROM all peripheral extremities, joint stability, 5/5 strength, and normal gait. Skin: Warm and dry without rashes, lesions, cyanosis, clubbing or  ecchymosis.  Neuro: Cranial nerves intact, reflexes equal bilaterally. Normal muscle tone, no cerebellar symptoms. Sensation intact.  Pysch: Alert and oriented X 3, normal affect, Insight and Judgment appropriate.    Assessment and Plan  1. Annual Preventative  Screening Examination   2. Essential hypertension  - EKG 12-Lead - CBC with Differential/Platelet - COMPLETE METABOLIC PANEL WITH GFR - Magnesium  3. Hyperlipidemia, mixed  - EKG 12-Lead - Lipid panel  4. Abnormal glucose  - EKG 12-Lead - Hemoglobin A1c - Insulin, random  5. Vitamin D deficiency  - VITAMIN D 25 Hydroxy   6. Gastroesophageal reflux disease without esophagitis  - CBC with Differential/Platelet  7. Postablative hypothyroidism   8. Screening for colorectal cancer  - POC Hemoccult Bld/Stl   9. Screening for ischemic heart disease  - EKG 12-Lead  10. FHx: heart disease  - EKG 12-Lead  11. Medication management  - Urinalysis, Routine w reflex microscopic - Microalbumin / creatinine urine ratio - Magnesium - Lipid panel - Hemoglobin A1c - Insulin, random - VITAMIN D 25 Hydroxy           Patient was counseled in prudent diet to achieve/maintain BMI less than 25 for weight control, BP monitoring, regular exercise and medications. Discussed med's effects and SE's. Screening labs and tests as requested with regular follow-up as recommended. Over 40 minutes of exam, counseling, chart review and high complex critical decision making was performed.   Kirtland Bouchard, MD

## 2021-04-14 ENCOUNTER — Other Ambulatory Visit: Payer: Self-pay | Admitting: Internal Medicine

## 2021-04-14 DIAGNOSIS — E890 Postprocedural hypothyroidism: Secondary | ICD-10-CM

## 2021-04-14 NOTE — Progress Notes (Signed)
============================================================ -   Test results slightly outside the reference range are not unusual. If there is anything important, I will review this with you,  otherwise it is considered normal test values.  If you have further questions,  please do not hesitate to contact me at the office or via My Chart.  ============================================================ ============================================================  -  Total Chol = 145     &   LDL Chol = 64 - Both  Excellent   - Very low risk for Heart Attack  / Stroke ============================================================ ============================================================  -  A1c - Normal - Great - No Diabetes  ! ============================================================ ============================================================  -  Vitamin D = 82 - Excellent  ! ============================================================ ============================================================  -  All Else - CBC - Kidneys - U/A - Electrolytes - Liver - Magnesium & Thyroid    - all  Normal / OK  ============================================================ ============================================================

## 2021-04-16 LAB — URINALYSIS, ROUTINE W REFLEX MICROSCOPIC
Bacteria, UA: NONE SEEN /HPF
Bilirubin Urine: NEGATIVE
Glucose, UA: NEGATIVE
Hgb urine dipstick: NEGATIVE
Hyaline Cast: NONE SEEN /LPF
Ketones, ur: NEGATIVE
Nitrite: NEGATIVE
Protein, ur: NEGATIVE
RBC / HPF: NONE SEEN /HPF (ref 0–2)
Specific Gravity, Urine: 1.005 (ref 1.001–1.035)
Squamous Epithelial / HPF: NONE SEEN /HPF (ref <=5)
pH: 6.5 (ref 5.0–8.0)

## 2021-04-16 LAB — MICROALBUMIN / CREATININE URINE RATIO
Creatinine, Urine: 22 mg/dL (ref 20–275)
Microalb Creat Ratio: 36 mcg/mg creat — ABNORMAL HIGH (ref <30)
Microalb, Ur: 0.8 mg/dL

## 2021-04-16 LAB — CBC WITH DIFFERENTIAL/PLATELET
Absolute Monocytes: 522 cells/uL (ref 200–950)
Basophils Absolute: 41 cells/uL (ref 0–200)
Basophils Relative: 0.7 %
Eosinophils Absolute: 133 cells/uL (ref 15–500)
Eosinophils Relative: 2.3 %
HCT: 41.2 % (ref 35.0–45.0)
Hemoglobin: 13.2 g/dL (ref 11.7–15.5)
Lymphs Abs: 1676 cells/uL (ref 850–3900)
MCH: 29.6 pg (ref 27.0–33.0)
MCHC: 32 g/dL (ref 32.0–36.0)
MCV: 92.4 fL (ref 80.0–100.0)
MPV: 9.3 fL (ref 7.5–12.5)
Monocytes Relative: 9 %
Neutro Abs: 3428 cells/uL (ref 1500–7800)
Neutrophils Relative %: 59.1 %
Platelets: 257 10*3/uL (ref 140–400)
RBC: 4.46 10*6/uL (ref 3.80–5.10)
RDW: 13.6 % (ref 11.0–15.0)
Total Lymphocyte: 28.9 %
WBC: 5.8 10*3/uL (ref 3.8–10.8)

## 2021-04-16 LAB — COMPLETE METABOLIC PANEL WITH GFR
AG Ratio: 1.9 (calc) (ref 1.0–2.5)
ALT: 16 U/L (ref 6–29)
AST: 20 U/L (ref 10–35)
Albumin: 4.3 g/dL (ref 3.6–5.1)
Alkaline phosphatase (APISO): 69 U/L (ref 37–153)
BUN: 11 mg/dL (ref 7–25)
CO2: 27 mmol/L (ref 20–32)
Calcium: 9.8 mg/dL (ref 8.6–10.4)
Chloride: 106 mmol/L (ref 98–110)
Creat: 0.81 mg/dL (ref 0.60–1.00)
Globulin: 2.3 g/dL (calc) (ref 1.9–3.7)
Glucose, Bld: 83 mg/dL (ref 65–99)
Potassium: 4.9 mmol/L (ref 3.5–5.3)
Sodium: 141 mmol/L (ref 135–146)
Total Bilirubin: 0.4 mg/dL (ref 0.2–1.2)
Total Protein: 6.6 g/dL (ref 6.1–8.1)
eGFR: 74 mL/min/{1.73_m2} (ref >=60)

## 2021-04-16 LAB — HEMOGLOBIN A1C
Hgb A1c MFr Bld: 5.4 % of total Hgb (ref <5.7)
Mean Plasma Glucose: 108 mg/dL
eAG (mmol/L): 6 mmol/L

## 2021-04-16 LAB — INSULIN, RANDOM: Insulin: 6.5 u[IU]/mL

## 2021-04-16 LAB — LIPID PANEL
Cholesterol: 145 mg/dL (ref <200)
HDL: 61 mg/dL (ref >=50)
LDL Cholesterol (Calc): 64 mg/dL (calc)
Non-HDL Cholesterol (Calc): 84 mg/dL (calc) (ref <130)
Total CHOL/HDL Ratio: 2.4 (calc) (ref <5.0)
Triglycerides: 113 mg/dL (ref <150)

## 2021-04-16 LAB — MAGNESIUM: Magnesium: 2.3 mg/dL (ref 1.5–2.5)

## 2021-04-16 LAB — VITAMIN D 25 HYDROXY (VIT D DEFICIENCY, FRACTURES): Vit D, 25-Hydroxy: 82 ng/mL (ref 30–100)

## 2021-04-16 LAB — MICROSCOPIC MESSAGE

## 2021-05-10 ENCOUNTER — Other Ambulatory Visit: Payer: Self-pay

## 2021-05-10 ENCOUNTER — Other Ambulatory Visit: Payer: Medicare Other

## 2021-05-10 DIAGNOSIS — E890 Postprocedural hypothyroidism: Secondary | ICD-10-CM

## 2021-05-11 LAB — TSH: TSH: 2.94 mIU/L (ref 0.40–4.50)

## 2021-05-11 NOTE — Progress Notes (Signed)
============================================================ ============================================================  -    TSH  (Thyroid) - Perfect now - So please stay at current dose for now  & recheck at your Dec 22 Office Visit with Hinton Dyer  ( If you prefer,  when due for a refill,  please let me know & can send in Rx for the 50 mcg tablets so you won't have to break the tablets )   ============================================================ ============================================================

## 2021-05-14 ENCOUNTER — Other Ambulatory Visit: Payer: Self-pay | Admitting: Internal Medicine

## 2021-05-14 DIAGNOSIS — E890 Postprocedural hypothyroidism: Secondary | ICD-10-CM

## 2021-05-14 MED ORDER — LEVOTHYROXINE SODIUM 50 MCG PO TABS: ORAL_TABLET | ORAL | 3 refills | Status: DC

## 2021-05-21 ENCOUNTER — Other Ambulatory Visit: Payer: Self-pay

## 2021-05-21 DIAGNOSIS — Z1211 Encounter for screening for malignant neoplasm of colon: Secondary | ICD-10-CM

## 2021-05-21 LAB — POC HEMOCCULT BLD/STL (HOME/3-CARD/SCREEN)
Card #2 Fecal Occult Blod, POC: NEGATIVE
Card #3 Fecal Occult Blood, POC: NEGATIVE
Fecal Occult Blood, POC: NEGATIVE

## 2021-05-22 DIAGNOSIS — Z1211 Encounter for screening for malignant neoplasm of colon: Secondary | ICD-10-CM | POA: Diagnosis not present

## 2021-05-22 DIAGNOSIS — Z1212 Encounter for screening for malignant neoplasm of rectum: Secondary | ICD-10-CM | POA: Diagnosis not present

## 2021-06-04 ENCOUNTER — Other Ambulatory Visit: Payer: Self-pay | Admitting: Internal Medicine

## 2021-08-28 NOTE — Progress Notes (Signed)
Medicare wellness and OV  Assessment:   Essential hypertension - continue medications, DASH diet, exercise and monitor at home. Call if greater than 130/80.  - CBC  Hyperlipidemia, unspecified hyperlipidemia type -continue medications, check lipids, decrease fatty foods, increase activity.  - LIPID - CMP - TSH  Prediabetes Discussed disease progression and risks Discussed diet/exercise, weight management and risk modification  Medication management Continued  Anemia, unspecified type - monitor  Gastroesophageal reflux disease, esophagitis presence not specified Continue PPI/H2 blocker, diet discussed  Uncomplicated asthma, unspecified asthma severity, unspecified whether persistent Monitor, controlled  MVP (mitral valve prolapse) Monitor, no symptoms  Vitamin D deficiency Continue supplement  Encounter for Medicare annual wellness exam 1 year  BMI 23.0-23.9, adult Monitor  Trochanteric bursitis of left hip Injected with 1% lidocaine and 10mg  Dexamethasone Given hip exercises to perform daily If no improvement notify the office and will order PT  Pneumococcal vaccine need Prevnar 20 vaccine given  Future Appointments  Date Time Provider [place]  04/15/2022 11:00 AM Unk Pinto, MD [facility] None  08/30/2022 11:00 AM Magda Bernheim, NP [facility] None     Plan:   During the course of the visit the patient was educated and counseled about appropriate screening and preventive services including:   Pneumococcal vaccine  Influenza vaccine Td vaccine Screening electrocardiogram Screening mammography Bone densitometry screening Colorectal cancer screening Diabetes screening Glaucoma screening Nutrition counseling   Subjective:   Isabel Oliver is a  80 y.o. WMF who presents for Medicare Annual Wellness Visit and follow up for chronic illness including HTN, chol, preDM, vitamin D def, osteoporosis.    Had + cologuard with  subsequent colonoscopy 04/2018, benign polyp, 5 [id number]) with Dr. Oletta Lamas.  She started having pain in left hip that started after 3 hour car trip in October. Now she will have intermittent pain and weakness of the left hip.    Her blood pressure has been controlled at home, today their BP is BP: 124/60 BP Readings from Last 3 Encounters:  08/30/21 124/60  04/13/21 140/80  04/03/21 (!) 145/85    She does workout, since she is not longer working, she is walking about a mile in her neighborhood and does some yoga. She denies chest pain, shortness of breath, dizziness. She has asthma, does inhaler once every day in the AM.   She is on cholesterol medication and denies myalgias. Her cholesterol is not at goal. The cholesterol last visit was:   Lab Results  Component Value Date   CHOL 145 04/13/2021   HDL 61 04/13/2021   LDLCALC 64 04/13/2021   TRIG 113 04/13/2021   CHOLHDL 2.4 04/13/2021    Last A1C in the office was:  Lab Results  Component Value Date   HGBA1C 5.4 04/13/2021   Patient is on Vitamin D supplement, 2000 international units she thinks..   Lab Results  Component Value Date   VD25OH 82 04/13/2021   She has history of osteoporosis, could not tolerate calcitonin or fosamax due to her GERD.  BMI is Body mass index is 25.07 kg/m. weight is up some, contributes it to the holidays. Wt Readings from Last 3 Encounters:  08/30/21 143 lb 12.8 oz (65.2 kg)  04/13/21 145 lb 9.6 oz (66 kg)  04/03/21 145 lb 9.6 oz (66 kg)     Names of Other Physician/Practitioners you currently use: 1. [facility]- here for primary care 2. Dr. Katy Fitch, eye doctor, 2021 3. Dr Baldomero Lamy. In [place],  dentist, last visit q 6 months 03/2021 Patient Care Team: Unk Pinto, MD as PCP - General (Internal Medicine) Laurence Spates, MD (Inactive) as Consulting Physician (Gastroenterology) Gus Height, MD (Inactive) as Consulting Physician (Obstetrics  and Gynecology) Danella Sensing, MD as Consulting Physician (Dermatology)   Medication Review Current Outpatient Medications on File Prior to Visit  Medication Sig Dispense Refill   albuterol (VENTOLIN HFA) 108 (90 Base) MCG/ACT inhaler Use  2 Inhalations  15 minutes  Apart  every 4 hours  as need to Rescue Asthma Attack 54 g 3   Ascorbic Acid (VITAMIN C) 500 MG CAPS Take 1 capsule by mouth daily.     aspirin 81 MG chewable tablet Chew 81 mg by mouth daily.     Calcium Carb-Cholecalciferol 1000-800 MG-UNIT TABS Take by mouth.     cetirizine (ZYRTEC) 10 MG tablet Take 10 mg by mouth daily.     Cholecalciferol (VITAMIN D) 2000 UNITS CAPS Take by mouth.     cyanocobalamin 500 MCG tablet Take 500 mcg by mouth daily.     diclofenac sodium (VOLTAREN) 1 % GEL APPLY 2 GRAMS TO AFFECTED AREA FOUR TIMES DAILY 100 g 0   diphenoxylate-atropine (LOMOTIL) 2.5-0.025 MG tablet Take 1 to 2 tablets every 4 hours if needed for Diarrhea 30 tablet 0   levothyroxine (SYNTHROID) 50 MCG tablet Take  1 tablet  Daily  on an empty stomach with only water for 30 minutes & no Antacid meds, Calcium or Magnesium for 4 hours & avoid Biotin 90 tablet 3   Magnesium 500 MG TABS Take 1 tablet by mouth daily.     montelukast (SINGULAIR) 10 MG tablet TAKE 1 TABLET BY MOUTH DAILY FOR ALLERGIES 90 tablet 1   Multiple Vitamins-Minerals (MULTIVITAMIN PO) Take by mouth daily.     Omega-3 Fatty Acids (FISH OIL PO) Take by mouth daily.     omeprazole (PRILOSEC) 20 MG capsule Take  1 capsule  Daily  to Prevent Indigestion & Heartburn 90 capsule 3   Probiotic Product (PROBIOTIC DAILY PO) Take by mouth daily.     rosuvastatin (CRESTOR) 20 MG tablet Take  1 tablet  Daily  for Cholesterol 90 tablet 3   verapamil (CALAN-SR) 120 MG CR tablet TAKE 1 TABLET BY MOUTH AT BEDTIME FOR BLOOD PRESSURE 90 tablet 1   zinc gluconate 50 MG tablet Take 50 mg by mouth daily.     No current facility-administered medications on file prior to visit.     Current Problems (verified) Patient Active Problem List   Diagnosis Date Noted   Hyperthyroidism 06/09/2019   Encounter for Medicare annual wellness exam 03/29/2015   Medication management 12/05/2014   Vitamin D deficiency 12/05/2014   Other abnormal glucose    MVP (mitral valve prolapse)    Hypertension    Hyperlipidemia    GERD (gastroesophageal reflux disease)    Asthma     Screening Tests Immunization History  Administered Date(s) Administered   Influenza, High Dose Seasonal PF 07/14/2014, 05/21/2016, 06/20/2017, 05/19/2019, 06/06/2020, 06/05/2021   Influenza-Unspecified 06/28/2013, 07/06/2015, 06/09/2018   Moderna Covid-19 Vaccine Bivalent Booster 64yrs & up 06/05/2021   Moderna SARS-COV2 Booster Vaccination 10/19/2019, 07/12/2020, 07/12/2020, 06/05/2021   Moderna Sars-Covid-2 Vaccination 09/30/2019, 10/19/2019   Pneumococcal Conjugate-13 02/01/2014   Pneumococcal-Unspecified 09/10/2007   Tdap 12/05/2009, 08/30/2020   Zoster, Live 09/09/2005   Preventative care: Last colonoscopy: 04/2018 due 2024 Last mammogram: 11/02/20 negative Last pap smear/pelvic exam: 2011 remote DEXA: 10/25/19 osteoporosis- on D 3 and calcium  Prior  vaccinations: TD or Tdap: 2021  Influenza: 06/06/20  Pneumococcal 20: 08/30/21 Prevnar 13: 2015 Shingles/Zostavax: 2007- suggest new vaccine  Allergies Allergies  Allergen Reactions   Adhesive [Tape] Hives   Codeine Hives   Fosamax [Alendronate Sodium] Other (See Comments)    Reflux increase   Penicillins Hives   Shellfish Allergy    Iodine Rash    SURGICAL HISTORY She  has a past surgical history that includes Appendectomy; Cholecystectomy; Eye surgery; and Tonsillectomy and adenoidectomy. FAMILY HISTORY Her family history includes Diabetes in her father; Heart disease in her father; Hyperlipidemia in her brother and sister; Hypertension in her mother. SOCIAL HISTORY She  reports that she has never smoked. She has never used  smokeless tobacco. She reports that she does not drink alcohol and does not use drugs.  MEDICARE WELLNESS OBJECTIVES: Physical activity: Current Exercise Habits: Home exercise routine, Type of exercise: walking, Time (Minutes): 20, Frequency (Times/Week): 7, Weekly Exercise (Minutes/Week): 140, Intensity: Mild, Exercise limited by: Other - see comments (left hip pain) Cardiac risk factors: Cardiac Risk Factors include: advanced age (>58men, >40 women);dyslipidemia;hypertension Depression/mood screen:   Depression screen [facility] 2/9 08/30/2021  Decreased Interest 0  Down, Depressed, Hopeless 0  PHQ - 2 Score 0    ADLs:  In your present state of health, do you have any difficulty performing the following activities: 08/30/2021 11/27/2020  Hearing? N N  Vision? N N  Difficulty concentrating or making decisions? N N  Walking or climbing stairs? Y N  Dressing or bathing? N N  Doing errands, shopping? N N  Preparing Food and eating ? - -  Using the Toilet? - -  In the past six months, have you accidently leaked urine? - -  Do you have problems with loss of bowel control? - -  Managing your Medications? - -  Managing your Finances? - -  Housekeeping or managing your Housekeeping? - -  Some recent data might be hidden     Cognitive Testing  Alert? Yes  Normal Appearance?Yes  Oriented to person? Yes  Place? Yes   Time? Yes  Recall of three objects?  Yes  Can perform simple calculations? Yes  Displays appropriate judgment?Yes  Can read the correct time from a watch face?Yes  EOL planning: Does Patient Have a Medical Advance Directive?: Yes Type of Advance Directive: Healthcare Power of Attorney, Living will Does patient want to make changes to medical advance directive?: No - Patient declined Copy of [place] in Chart?: No - copy requested   Objective:     Blood pressure 124/60, pulse 75, temperature 97.7 F (36.5 C), weight 143 lb 12.8 oz (65.2 kg), SpO2 97  %. Body mass index is 25.07 kg/m.  General appearance: alert, no distress, WD/WN,  female HEENT: normocephalic, sclerae anicteric, TMs pearly, nares patent, no discharge or erythema, pharynx normal Oral cavity: MMM, no lesions Neck: supple, no lymphadenopathy, no thyromegaly, no masses Heart: RRR, normal S1, S2, no murmurs Lungs: CTA bilaterally, no wheezes, rhonchi, or rales Abdomen: +bs, soft, non tender, non distended, no masses, no hepatomegaly, no splenomegaly Musculoskeletal:  no swelling, no obvious deformity. Maximal tenderness over greater trochanter left hip Extremities: no edema, no cyanosis, no clubbing Pulses: 2+ symmetric, upper and lower extremities, normal cap refill Neurological: alert, oriented x 3, CN2-12 intact, strength normal upper extremities and lower extremities, sensation normal throughout, DTRs 2+ throughout, no cerebellar signs, gait normal Psychiatric: normal affect, behavior normal, pleasant  Breast: defer Gyn: defer Rectal: defer  Left greater trochanter was prepped with betadine. A trigger point injection was performed at the site of maximal tenderness of left greater trochanter bursa using 1% plain Lidocaine and dexamethasone. This was well tolerated, and followed by moderate relief of pain.      Medicare Attestation I have personally reviewed: The patient's medical and social history Their use of alcohol, tobacco or illicit drugs Their current medications and supplements The patient's functional ability including ADLs,fall risks, home safety risks, cognitive, and hearing and visual impairment Diet and physical activities Evidence for depression or mood disorders  The patient's weight, height, BMI, and visual acuity have been recorded in the chart.  I have made referrals, counseling, and provided education to the patient based on review of the above and I have provided the patient with a written personalized care plan for preventive services.      Magda Bernheim, NP   08/30/2021

## 2021-08-30 ENCOUNTER — Encounter: Payer: Self-pay | Admitting: Nurse Practitioner

## 2021-08-30 ENCOUNTER — Ambulatory Visit (INDEPENDENT_AMBULATORY_CARE_PROVIDER_SITE_OTHER): Payer: Medicare Other | Admitting: Nurse Practitioner

## 2021-08-30 ENCOUNTER — Other Ambulatory Visit: Payer: Self-pay

## 2021-08-30 VITALS — BP 124/60 | HR 75 | Temp 97.7°F | Wt 143.8 lb

## 2021-08-30 DIAGNOSIS — E782 Mixed hyperlipidemia: Secondary | ICD-10-CM

## 2021-08-30 DIAGNOSIS — Z0000 Encounter for general adult medical examination without abnormal findings: Secondary | ICD-10-CM

## 2021-08-30 DIAGNOSIS — M7062 Trochanteric bursitis, left hip: Secondary | ICD-10-CM

## 2021-08-30 DIAGNOSIS — I341 Nonrheumatic mitral (valve) prolapse: Secondary | ICD-10-CM

## 2021-08-30 DIAGNOSIS — J45909 Unspecified asthma, uncomplicated: Secondary | ICD-10-CM

## 2021-08-30 DIAGNOSIS — E559 Vitamin D deficiency, unspecified: Secondary | ICD-10-CM

## 2021-08-30 DIAGNOSIS — R6889 Other general symptoms and signs: Secondary | ICD-10-CM | POA: Diagnosis not present

## 2021-08-30 DIAGNOSIS — I10 Essential (primary) hypertension: Secondary | ICD-10-CM | POA: Diagnosis not present

## 2021-08-30 DIAGNOSIS — Z23 Encounter for immunization: Secondary | ICD-10-CM

## 2021-08-30 DIAGNOSIS — Z0001 Encounter for general adult medical examination with abnormal findings: Secondary | ICD-10-CM | POA: Diagnosis not present

## 2021-08-30 DIAGNOSIS — K219 Gastro-esophageal reflux disease without esophagitis: Secondary | ICD-10-CM

## 2021-08-30 DIAGNOSIS — R7309 Other abnormal glucose: Secondary | ICD-10-CM | POA: Diagnosis not present

## 2021-08-30 NOTE — Patient Instructions (Signed)
Hip Exercises °Ask your health care provider which exercises are safe for you. Do exercises exactly as told by your health care provider and adjust them as directed. It is normal to feel mild stretching, pulling, tightness, or discomfort as you do these exercises. Stop right away if you feel sudden pain or your pain gets worse. Do not begin these exercises until told by your health care provider. °Stretching and range-of-motion exercises °These exercises warm up your muscles and joints and improve the movement and flexibility of your hip. These exercises also help to relieve pain, numbness, and tingling. You may be asked to limit your range of motion if you had a hip replacement. Talk to your health care provider about these restrictions. °Hamstrings, supine ° °Lie on your back (supine position). °Loop a belt or towel over the ball of your left / right foot. The ball of your foot is on the walking surface, right under your toes. °Straighten your left / right knee and slowly pull on the belt or towel to raise your leg until you feel a gentle stretch behind your knee (hamstring). °Do not let your knee bend while you do this. °Keep your other leg flat on the floor. °Hold this position for __________ seconds. °Slowly return your leg to the starting position. °Repeat __________ times. Complete this exercise __________ times a day. °Hip rotation ° °Lie on your back on a firm surface. °With your left / right hand, gently pull your left / right knee toward the shoulder that is on the same side of the body. Stop when your knee is pointing toward the ceiling. °Hold your left / right ankle with your other hand. °Keeping your knee steady, gently pull your left / right ankle toward your other shoulder until you feel a stretch in your buttocks. °Keep your hips and shoulders firmly planted while you do this stretch. °Hold this position for __________ seconds. °Repeat __________ times. Complete this exercise __________ times a  day. °Seated stretch °This exercise is sometimes called hamstrings and adductors stretch. °Sit on the floor with your legs stretched wide. Keep your knees straight during this exercise. °Keeping your head and back in a straight line, bend at your waist to reach for your left foot (position A). You should feel a stretch in your right inner thigh (adductors). °Hold this position for __________ seconds. Then slowly return to the upright position. °Keeping your head and back in a straight line, bend at your waist to reach forward (position B). You should feel a stretch behind both of your thighs and knees (hamstrings). °Hold this position for __________ seconds. Then slowly return to the upright position. °Keeping your head and back in a straight line, bend at your waist to reach for your right foot (position C). You should feel a stretch in your left inner thigh (adductors). °Hold this position for __________ seconds. Then slowly return to the upright position. °Repeat __________ times. Complete this exercise __________ times a day. °Lunge °This exercise stretches the muscles of the hip (hip flexors). °Place your left / right knee on the floor and bend your other knee so that is directly over your ankle. You should be half-kneeling. °Keep good posture with your head over your shoulders. °Tighten your buttocks to point your tailbone downward. This will prevent your back from arching too much. °You should feel a gentle stretch in the front of your left / right thigh and hip. If you do not feel a stretch, slide your other foot   forward slightly and then slowly lunge forward with your chest up until your knee once again lines up over your ankle. °Make sure your tailbone continues to point downward. °Hold this position for __________ seconds. °Slowly return to the starting position. °Repeat __________ times. Complete this exercise __________ times a day. °Strengthening exercises °These exercises build strength and endurance  in your hip. Endurance is the ability to use your muscles for a long time, even after they get tired. °Bridge °This exercise strengthens the muscles of your hip (hip extensors). °Lie on your back on a firm surface with your knees bent and your feet flat on the floor. °Tighten your buttocks muscles and lift your bottom off the floor until the trunk of your body and your hips are level with your thighs. °Do not arch your back. °You should feel the muscles working in your buttocks and the back of your thighs. If you do not feel these muscles, slide your feet 1-2 inches (2.5-5 cm) farther away from your buttocks. °Hold this position for __________ seconds. °Slowly lower your hips to the starting position. °Let your muscles relax completely between repetitions. °Repeat __________ times. Complete this exercise __________ times a day. °Straight leg raises, side-lying °This exercise strengthens the muscles that move the hip joint away from the center of the body (hip abductors). °Lie on your side with your left / right leg in the top position. Lie so your head, shoulder, hip, and knee line up. You may bend your bottom knee slightly to help you balance. °Roll your hips slightly forward, so your hips are stacked directly over each other and your left / right knee is facing forward. °Leading with your heel, lift your top leg 4-6 inches (10-15 cm). You should feel the muscles in your top hip lifting. °Do not let your foot drift forward. °Do not let your knee roll toward the ceiling. °Hold this position for __________ seconds. °Slowly return to the starting position. °Let your muscles relax completely between repetitions. °Repeat __________ times. Complete this exercise __________ times a day. °Straight leg raises, side-lying °This exercise strengthens the muscles that move the hip joint toward the center of the body (hip adductors). °Lie on your side with your left / right leg in the bottom position. Lie so your head, shoulder,  hip, and knee line up. You may place your upper foot in front to help you balance. °Roll your hips slightly forward, so your hips are stacked directly over each other and your left / right knee is facing forward. °Tense the muscles in your inner thigh and lift your bottom leg 4-6 inches (10-15 cm). °Hold this position for __________ seconds. °Slowly return to the starting position. °Let your muscles relax completely between repetitions. °Repeat __________ times. Complete this exercise __________ times a day. °Straight leg raises, supine °This exercise strengthens the muscles in the front of your thigh (quadriceps). °Lie on your back (supine position) with your left / right leg extended and your other knee bent. °Tense the muscles in the front of your left / right thigh. You should see your kneecap slide up or see increased dimpling just above your knee. °Keep these muscles tight as you raise your leg 4-6 inches (10-15 cm) off the floor. Do not let your knee bend. °Hold this position for __________ seconds. °Keep these muscles tense as you lower your leg. °Relax the muscles slowly and completely between repetitions. °Repeat __________ times. Complete this exercise __________ times a day. °Hip abductors, standing °This   exercise strengthens the muscles that move the leg and hip joint away from the center of the body (hip abductors). °Tie one end of a rubber exercise band or tubing to a secure surface, such as a chair, table, or pole. °Loop the other end of the band or tubing around your left / right ankle. °Keeping your ankle with the band or tubing directly opposite the secured end, step away until there is tension in the tubing or band. Hold on to a chair, table, or pole as needed for balance. °Lift your left / right leg out to your side. While you do this: °Keep your back upright. °Keep your shoulders over your hips. °Keep your toes pointing forward. °Make sure to use your hip muscles to slowly lift your leg. Do not  tip your body or forcefully lift your leg. °Hold this position for __________ seconds. °Slowly return to the starting position. °Repeat __________ times. Complete this exercise __________ times a day. °Squats °This exercise strengthens the muscles in the front of your thigh (quadriceps). °Stand in a door frame so your feet and knees are in line with the frame. You may place your hands on the frame for balance. °Slowly bend your knees and lower your hips like you are going to sit in a chair. °Keep your lower legs in a straight-up-and-down position. °Do not let your hips go lower than your knees. °Do not bend your knees lower than told by your health care provider. °If your hip pain increases, do not bend as low. °Hold this position for ___________ seconds. °Slowly push with your legs to return to standing. Do not use your hands to pull yourself to standing. °Repeat __________ times. Complete this exercise __________ times a day. °This information is not intended to replace advice given to you by your health care provider. Make sure you discuss any questions you have with your health care provider. °Document Revised: 01/10/2021 Document Reviewed: 01/10/2021 °Elsevier Patient Education © 2022 [employer] ° °

## 2021-08-31 ENCOUNTER — Other Ambulatory Visit: Payer: Self-pay | Admitting: Nurse Practitioner

## 2021-08-31 DIAGNOSIS — E039 Hypothyroidism, unspecified: Secondary | ICD-10-CM

## 2021-08-31 LAB — CBC WITH DIFFERENTIAL/PLATELET
Absolute Monocytes: 520 cells/uL (ref 200–950)
Basophils Absolute: 52 cells/uL (ref 0–200)
Basophils Relative: 0.8 %
Eosinophils Absolute: 98 cells/uL (ref 15–500)
Eosinophils Relative: 1.5 %
HCT: 43.8 % (ref 35.0–45.0)
Hemoglobin: 14.6 g/dL (ref 11.7–15.5)
Lymphs Abs: 1710 cells/uL (ref 850–3900)
MCH: 31.5 pg (ref 27.0–33.0)
MCHC: 33.3 g/dL (ref 32.0–36.0)
MCV: 94.4 fL (ref 80.0–100.0)
MPV: 9.2 fL (ref 7.5–12.5)
Monocytes Relative: 8 %
Neutro Abs: 4121 cells/uL (ref 1500–7800)
Neutrophils Relative %: 63.4 %
Platelets: 267 10*3/uL (ref 140–400)
RBC: 4.64 10*6/uL (ref 3.80–5.10)
RDW: 12.5 % (ref 11.0–15.0)
Total Lymphocyte: 26.3 %
WBC: 6.5 10*3/uL (ref 3.8–10.8)

## 2021-08-31 LAB — COMPLETE METABOLIC PANEL WITH GFR
AG Ratio: 2 (calc) (ref 1.0–2.5)
ALT: 18 U/L (ref 6–29)
AST: 20 U/L (ref 10–35)
Albumin: 4.7 g/dL (ref 3.6–5.1)
Alkaline phosphatase (APISO): 69 U/L (ref 37–153)
BUN: 16 mg/dL (ref 7–25)
CO2: 27 mmol/L (ref 20–32)
Calcium: 10.2 mg/dL (ref 8.6–10.4)
Chloride: 104 mmol/L (ref 98–110)
Creat: 0.74 mg/dL (ref 0.60–1.00)
Globulin: 2.4 g/dL (calc) (ref 1.9–3.7)
Glucose, Bld: 83 mg/dL (ref 65–99)
Potassium: 4.6 mmol/L (ref 3.5–5.3)
Sodium: 140 mmol/L (ref 135–146)
Total Bilirubin: 0.5 mg/dL (ref 0.2–1.2)
Total Protein: 7.1 g/dL (ref 6.1–8.1)
eGFR: 82 mL/min/{1.73_m2} (ref >=60)

## 2021-08-31 LAB — LIPID PANEL
Cholesterol: 157 mg/dL (ref <200)
HDL: 64 mg/dL (ref >=50)
LDL Cholesterol (Calc): 74 mg/dL (calc)
Non-HDL Cholesterol (Calc): 93 mg/dL (calc) (ref <130)
Total CHOL/HDL Ratio: 2.5 (calc) (ref <5.0)
Triglycerides: 105 mg/dL (ref <150)

## 2021-08-31 LAB — TSH: TSH: 7.65 mIU/L — ABNORMAL HIGH (ref 0.40–4.50)

## 2021-09-02 ENCOUNTER — Other Ambulatory Visit: Payer: Self-pay | Admitting: Adult Health

## 2021-09-04 NOTE — Progress Notes (Signed)
Lvm to schedule nv per [employer]

## 2021-10-16 ENCOUNTER — Other Ambulatory Visit: Payer: Self-pay

## 2021-10-16 ENCOUNTER — Ambulatory Visit (INDEPENDENT_AMBULATORY_CARE_PROVIDER_SITE_OTHER): Payer: Medicare Other

## 2021-10-16 DIAGNOSIS — E039 Hypothyroidism, unspecified: Secondary | ICD-10-CM

## 2021-10-16 LAB — TSH: TSH: 28.18 mIU/L — ABNORMAL HIGH (ref 0.40–4.50)

## 2021-10-16 NOTE — Progress Notes (Signed)
Patient presents to the office for a nurse visit to have labs done to check TSH levels. Patient is taking the Levothyroxine as directed. No questions or concerns. Vitals taken and recorded.

## 2021-10-17 ENCOUNTER — Other Ambulatory Visit: Payer: Self-pay | Admitting: Nurse Practitioner

## 2021-10-17 ENCOUNTER — Encounter: Payer: Self-pay | Admitting: Internal Medicine

## 2021-10-17 DIAGNOSIS — E039 Hypothyroidism, unspecified: Secondary | ICD-10-CM

## 2021-10-17 MED ORDER — LEVOTHYROXINE SODIUM 100 MCG PO TABS: 100.0000 ug | ORAL_TABLET | Freq: Every day | ORAL | 11 refills | Status: DC

## 2021-11-27 NOTE — Progress Notes (Signed)
Assessment and Plan: ? ?Isabel Oliver was seen today for follow-up. ? ?Diagnoses and all orders for this visit: ? ?Essential hypertension ?- continue medications, DASH diet, exercise and monitor at home. Call if greater than 130/80.  ? ?Hypothyroidism, unspecified type ?Please take your thyroid medication greater than 30 min before breakfast, separated by at least 4 hours  from antacids, calcium, iron, and multivitamins.  ?-     levothyroxine (SYNTHROID) 100 MCG tablet; Take 1 tablet (100 mcg total) by mouth daily. ?-     TSH ? ?Overweight ?Long discussion about weight loss, diet, and exercise ?Recommended diet heavy in fruits and veggies and low in animal meats, cheeses, and dairy products, appropriate calorie intake ?Follow up at next visit   ? ? ? ?Further disposition pending results of labs. Discussed med's effects and SE's.   ?Over 20 minutes of exam, counseling, chart review, and critical decision making was performed.  ? ?Future Appointments  ?Date Time Provider [place]  ?04/15/2022 11:00 AM Unk Pinto, MD [facility] None  ?08/30/2022 11:00 AM Magda Bernheim, NP [facility] None  ? ? ?------------------------------------------------------------------------------------------------------------------ ? ? ?HPI ?BP 132/64   Pulse 73   Temp 97.7 ?F (36.5 ?C)   Wt 144 lb 3.2 oz (65.4 kg)   SpO2 95%   BMI 25.14 kg/m?  ? ?81 y.o.female presents for reevaluation of thyroid and hypertension ? ?Currently controlled on verapamil '120mg'$  daily ?BP Readings from Last 3 Encounters:  ?11/29/21 132/64  ?10/16/21 136/66  ?08/30/21 124/60  ? ?She is currently on levothyroxine 100 mcg daily. No new symptoms of constipation, dry skin, hair loss. Has had more energy. ?Lab Results  ?Component Value Date  ? TSH 28.18 (H) 10/16/2021  ?  ?BMI is Body mass index is 25.14 kg/m?., she has been working on diet and exercise. ?Wt Readings from Last 3 Encounters:  ?11/29/21 144 lb 3.2 oz (65.4 kg)  ?10/16/21 144 lb (65.3 kg)   ?08/30/21 143 lb 12.8 oz (65.2 kg)  ? ? ? ?Past Medical History:  ?Diagnosis Date  ? Anemia   ? Asthma   ? GERD (gastroesophageal reflux disease)   ? Goiter   ? Hyperlipidemia   ? Hypertension   ? MVP (mitral valve prolapse)   ? Osteopenia   ? Other abnormal glucose   ? Positive colorectal cancer screening using Cologuard test 08/11/2017  ?  ? ?Allergies  ?Allergen Reactions  ? Adhesive [Tape] Hives  ? Codeine Hives  ? Fosamax [Alendronate Sodium] Other (See Comments)  ?  Reflux increase  ? Penicillins Hives  ? Shellfish Allergy   ? Iodine Rash  ? ? ?Current Outpatient Medications on File Prior to Visit  ?Medication Sig  ? albuterol (VENTOLIN HFA) 108 (90 Base) MCG/ACT inhaler Use  2 Inhalations  15 minutes  Apart  every 4 hours  as need to Rescue Asthma Attack  ? Ascorbic Acid (VITAMIN C) 500 MG CAPS Take 1 capsule by mouth daily.  ? aspirin 81 MG chewable tablet Chew 81 mg by mouth daily.  ? Calcium Carb-Cholecalciferol 1000-800 MG-UNIT TABS Take by mouth.  ? cetirizine (ZYRTEC) 10 MG tablet Take 10 mg by mouth daily.  ? Cholecalciferol (VITAMIN D) 2000 UNITS CAPS Take by mouth.  ? cyanocobalamin 500 MCG tablet Take 500 mcg by mouth daily.  ? diclofenac sodium (VOLTAREN) 1 % GEL APPLY 2 GRAMS TO AFFECTED AREA FOUR TIMES DAILY  ? Magnesium 500 MG TABS Take 1 tablet by mouth daily.  ? montelukast (SINGULAIR) 10 MG  tablet Take  1 tablet  Daily for Allergies                                                            /                      TAKE 1 TABLET BY MOUTH  ? Multiple Vitamins-Minerals (MULTIVITAMIN PO) Take by mouth daily.  ? Omega-3 Fatty Acids (FISH OIL PO) Take by mouth daily.  ? omeprazole (PRILOSEC) 20 MG capsule Take  1 capsule  Daily  to Prevent Indigestion & Heartburn  ? Probiotic Product (PROBIOTIC DAILY PO) Take by mouth daily.  ? rosuvastatin (CRESTOR) 20 MG tablet Take  1 tablet  Daily  for Cholesterol  ? verapamil (CALAN-SR) 120 MG CR tablet TAKE 1 TABLET BY MOUTH AT BEDTIME FOR BLOOD PRESSURE  ?  zinc gluconate 50 MG tablet Take 50 mg by mouth daily.  ? ?No current facility-administered medications on file prior to visit.  ? ? ?ROS: all negative except above.  ? ?Physical Exam: ? ?BP 132/64   Pulse 73   Temp 97.7 ?F (36.5 ?C)   Wt 144 lb 3.2 oz (65.4 kg)   SpO2 95%   BMI 25.14 kg/m?  ? ?General Appearance: Well nourished, in no apparent distress. ?Eyes: PERRLA, EOMs, conjunctiva no swelling or erythema ?Sinuses: No Frontal/maxillary tenderness ?ENT/Mouth: Ext aud canals clear, TMs without erythema, bulging. No erythema, swelling, or exudate on post pharynx.  Tonsils not swollen or erythematous. Hearing normal.  ?Neck: Supple, thyroid normal.  ?Respiratory: Respiratory effort normal, BS equal bilaterally without rales, rhonchi, wheezing or stridor.  ?Cardio: RRR with no MRGs. Brisk peripheral pulses without edema.  ?Abdomen: Soft, + BS.  Non tender, no guarding, rebound, hernias, masses. ?Lymphatics: Non tender without lymphadenopathy.  ?Musculoskeletal: Full ROM, 5/5 strength, normal gait.  ?Skin: Warm, dry without rashes, lesions, ecchymosis.  ?Neuro: Cranial nerves intact. Normal muscle tone, no cerebellar symptoms. Sensation intact.  ?Psych: Awake and oriented X 3, normal affect, Insight and Judgment appropriate.  ?  ? ?Magda Bernheim, NP ?12:01 PM ?[facility] ? ?

## 2021-11-29 ENCOUNTER — Encounter: Payer: Self-pay | Admitting: Nurse Practitioner

## 2021-11-29 ENCOUNTER — Other Ambulatory Visit: Payer: Self-pay

## 2021-11-29 ENCOUNTER — Ambulatory Visit (INDEPENDENT_AMBULATORY_CARE_PROVIDER_SITE_OTHER): Payer: Medicare Other | Admitting: Nurse Practitioner

## 2021-11-29 VITALS — BP 132/64 | HR 73 | Temp 97.7°F | Wt 144.2 lb

## 2021-11-29 DIAGNOSIS — E039 Hypothyroidism, unspecified: Secondary | ICD-10-CM

## 2021-11-29 DIAGNOSIS — E663 Overweight: Secondary | ICD-10-CM

## 2021-11-29 DIAGNOSIS — I10 Essential (primary) hypertension: Secondary | ICD-10-CM

## 2021-11-29 MED ORDER — LEVOTHYROXINE SODIUM 100 MCG PO TABS: 100.0000 ug | ORAL_TABLET | Freq: Every day | ORAL | 11 refills | Status: DC

## 2021-11-30 LAB — TSH: TSH: 0.24 mIU/L — ABNORMAL LOW (ref 0.40–4.50)

## 2021-12-01 ENCOUNTER — Other Ambulatory Visit: Payer: Self-pay | Admitting: Nurse Practitioner

## 2021-12-01 ENCOUNTER — Other Ambulatory Visit: Payer: Self-pay | Admitting: Internal Medicine

## 2021-12-01 DIAGNOSIS — K219 Gastro-esophageal reflux disease without esophagitis: Secondary | ICD-10-CM

## 2021-12-01 DIAGNOSIS — E782 Mixed hyperlipidemia: Secondary | ICD-10-CM

## 2022-04-05 ENCOUNTER — Other Ambulatory Visit: Payer: Self-pay | Admitting: Internal Medicine

## 2022-04-05 DIAGNOSIS — Z1231 Encounter for screening mammogram for malignant neoplasm of breast: Secondary | ICD-10-CM

## 2022-04-13 ENCOUNTER — Encounter: Payer: Self-pay | Admitting: Internal Medicine

## 2022-04-13 NOTE — Progress Notes (Signed)
Annual Screening/Preventative Visit & Comprehensive Evaluation &  Examination  Future Appointments  Date Time Provider Department  04/15/2022              cpe 11:00 AM Unk Pinto, MD [facility]  04/24/2022 12:50 PM [facility] 2 [facility]  08/30/2022           wellness 11:00 AM Alycia Rossetti, NP [facility]  04/17/2023               cpe 11:00 AM Unk Pinto, MD [facility]         This very nice 81 y.o. WWF  presents for a Screening /Preventative Visit & comprehensive evaluation and management of multiple medical co-morbidities.  Patient has been followed for HTN, HLD, Hypothyroidism, Prediabetes  and Vitamin D Deficiency. Patient's GERD is controlled on her meds.        HTN predates circa 1995. Her BP has been controlled at home and patient denies any cardiac symptoms as chest pain, palpitations, shortness of breath, dizziness or ankle swelling. Today's BP is at goal - 128/70  .       Her hyperlipidemia is controlled with diet and Rosuvastatin. Patient denies myalgias or other medication SE's. Last lipids were at goal :  Lab Results  Component Value Date   CHOL 157 08/30/2021   HDL 64 08/30/2021   LDLCALC 74 08/30/2021   TRIG 105 08/30/2021   CHOLHDL 2.5 08/30/2021         Patient has hx/o prediabetes predating (A1c 6.0% /2014) and patient denies reactive hypoglycemic symptoms, visual blurring, diabetic polys or paresthesias. 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   aspirin 81 MG tablet Take daily.   Calcium -Cholecalciferol 1000 mg - 800 u Take daily   cetirizine 10 MG tablet Take 1 daily.   VITAMIN D 2000 UNITS  Take daily   cyanocobalamin 500 MCG tablet Take 500 mcg  daily.   diclofenac 1 % GEL APPLY 2 GRAMS  FOUR TIMES DAILY   LOMOTIL  tablet Take 1 to 2 tablets every 4 hours if needed for Diarrhea   levothyroxine  100 MCG tablet Take  1 tablet  Daily     Magnesium 500 MG TABS Take 1 tablet  daily.   montelukast  10 MG tablet TAKE 1 TABLET  DAILY    Multiple Vitamins-Minerals Take daily.   Omega-3 FISH OIL  Take daily.   omeprazole 20 MG capsule Take  1 capsule  Daily     Probiotic  Take daily.   rosuvastatin  20 MG tablet Take  1 tablet  Daily  for Cholesterol   verapamil-SR 120 MG CR  Take 1 tablet at bedtime for blood pressure.   zinc 50 MG tablet Take daily.     Allergies  Allergen Reactions   Adhesive [Tape] Hives   Codeine Hives   Fosamax [Alendronate Sodium] Other (See Comments)    Reflux increase   Penicillins Hives   Shellfish Allergy    Iodine Rash     Past Medical History:  Diagnosis Date   Anemia    Asthma    GERD (gastroesophageal reflux disease)    Goiter    Hyperlipidemia    Hypertension    MVP (mitral valve prolapse)    Osteopenia    Other abnormal glucose    Positive colorectal cancer screening using Cologuard test 08/11/2017     Health Maintenance  Topic Date Due   Hepatitis C Screening  Never done   Zoster Vaccines- Shingrix (1 of 2) Never done   COVID-19 Vaccine (3 - Mixed Product risk series) 11/16/2019   INFLUENZA VACCINE  04/09/2021   COLONOSCOPY (Pts 45-34yr Insurance coverage will need to be confirmed)  03/31/2023   TETANUS/TDAP  08/30/2030   DEXA SCAN  Completed   PNA vac Low Risk Adult  Completed   HPV VACCINES  Aged Out     Immunization History  Administered Date(s) Administered   Influenza, High Dose\ 06/20/2017, 05/19/2019, 06/06/2020   Influenza 06/28/2013, 07/06/2015, 06/09/2018   Moderna Sars-Covid-2 Vacc 09/30/2019, 10/19/2019   Pneumococcal -13 02/01/2014   Pneumococcal-23 09/10/2007   Tdap 12/05/2009, 08/30/2020   Zoster, Live 09/09/2005    Last Colon - 09/30/2017 - Dr JArty Baumgartnerrecc 5 yr f/u due Jan 2024.   Last MGM -  04/24/2022   Past Surgical History:  Procedure Laterality Date   APPENDECTOMY     CHOLECYSTECTOMY     EYE SURGERY     TONSILLECTOMY AND ADENOIDECTOMY       Family History  Problem Relation Age of Onset   Hypertension Mother    Diabetes Father    Heart disease Father    Hyperlipidemia Sister    Hyperlipidemia Brother    Breast cancer Neg Hx      Social History   Tobacco Use   Smoking status: Never   Smokeless tobacco: Never  Substance Use Topics   Alcohol use: No   Drug use: No      ROS Constitutional: Denies fever, chills, weight loss/gain, headaches, insomnia,  night sweats, and change in appetite. Does c/o fatigue. Eyes: Denies redness, blurred vision, diplopia, discharge, itchy, watery eyes.  ENT: Denies discharge, congestion, post nasal drip, epistaxis, sore throat, earache, hearing loss, dental pain, Tinnitus, Vertigo, Sinus pain, snoring.  Cardio: Denies chest pain, palpitations, irregular heartbeat, syncope, dyspnea, diaphoresis, orthopnea, PND, claudication, edema Respiratory: denies cough, dyspnea, DOE, pleurisy, hoarseness, laryngitis, wheezing.  Gastrointestinal: Denies dysphagia, heartburn, reflux, water brash, pain, cramps, nausea, vomiting, bloating, diarrhea, constipation, hematemesis, melena, hematochezia, jaundice, hemorrhoids Genitourinary: Denies dysuria, frequency, urgency, nocturia, hesitancy, discharge, hematuria, flank pain Breast: Breast lumps, nipple discharge, bleeding.  Musculoskeletal: Denies arthralgia, myalgia,  stiffness, Jt. Swelling, pain, limp, and strain/sprain. Denies falls. Skin: Denies puritis, rash, hives, warts, acne, eczema, changing in skin lesion Neuro: No weakness, tremor, incoordination, spasms, paresthesia, pain Psychiatric: Denies confusion, memory loss, sensory loss. Denies Depression. Endocrine: Denies change in weight, skin, hair change, nocturia, and paresthesia, diabetic polys, visual blurring, hyper / hypo glycemic episodes.  Heme/Lymph: No excessive bleeding, bruising,  enlarged lymph nodes.  Physical Exam  BP 128/70   Pulse 65   Temp 97.9 F (36.6 C)   Resp 17   Ht 5' 3.5" (1.613 m)   Wt 142 lb 6.4 oz (64.6 kg)   SpO2 97%   BMI 24.83 kg/m   General Appearance: Well nourished, well groomed and in no apparent distress.  Eyes: PERRLA, EOMs, conjunctiva no swelling or erythema, normal fundi and vessels. Sinuses: No frontal/maxillary tenderness ENT/Mouth: EACs patent / TMs  nl. Nares clear without erythema, swelling, mucoid exudates. Oral hygiene is good. No erythema, swelling, or exudate. Tongue normal, non-obstructing. Tonsils not swollen or erythematous. Hearing normal.  Neck: Supple, thyroid not palpable. No bruits, nodes or JVD. Respiratory: Respiratory effort normal.  BS equal and clear bilateral without rales, rhonci, wheezing or stridor. Cardio: Heart sounds are normal with regular rate and rhythm and no murmurs, rubs or gallops. Peripheral pulses are normal and equal bilaterally without edema. No aortic or femoral bruits. Chest: symmetric with normal excursions and percussion. Breasts: Deferred to upcoming MGM.  Abdomen: Flat, soft with bowel sounds active. Nontender, no guarding, rebound, hernias, masses, or organomegaly.  Lymphatics: Non tender without lymphadenopathy.  Musculoskeletal: Full ROM all peripheral extremities, joint stability, 5/5 strength, and normal gait. Skin: Warm and dry without rashes, lesions, cyanosis, clubbing or  ecchymosis.  Neuro:  Cranial nerves intact, reflexes equal bilaterally. Normal muscle tone, no cerebellar symptoms. Sensation intact.  Pysch: Alert and oriented X 3, normal affect, Insight and Judgment appropriate.    Assessment and Plan  1. Annual Preventative Screening Examination   2. Essential hypertension  - EKG 12-Lead - Urinalysis, Routine w reflex microscopic - Microalbumin / creatinine urine ratio - CBC with Differential/Platelet - COMPLETE METABOLIC PANEL WITH GFR - Magnesium - TSH  3. Hyperlipidemia, mixed  - EKG 12-Lead - Lipid panel - TSH  4. Abnormal glucose  - EKG 12-Lead - Hemoglobin A1c - Insulin, random  5. Vitamin D deficiency  - VITAMIN D 25 Hydroxy   6. Gastroesophageal reflux disease   - CBC with Differential/Platelet  7. Postablative hypothyroidism  - TSH  8. Screening for colorectal cancer  - POC Hemoccult Bld/Stl  9. Screening for heart disease  - EKG 12-Lead  10. FHx: heart disease  - EKG 12-Lead  11. Medication management  - Urinalysis, Routine w reflex microscopic - Microalbumin / creatinine urine ratio - CBC with Differential/Platelet - COMPLETE METABOLIC PANEL WITH GFR - Magnesium - Lipid panel - TSH - Hemoglobin A1c - Insulin, random - VITAMIN D 25 Hydroxy           Patient was counseled in prudent diet to achieve/maintain BMI less than 25 for weight control, BP monitoring, regular exercise and medications. Discussed med's effects and SE's. Screening labs and tests as requested with regular follow-up as recommended. Over 40 minutes of exam, counseling, chart review and high complex critical decision making was performed.   Kirtland Bouchard, MD

## 2022-04-13 NOTE — Patient Instructions (Signed)

## 2022-04-15 ENCOUNTER — Encounter: Payer: Self-pay | Admitting: Internal Medicine

## 2022-04-15 ENCOUNTER — Ambulatory Visit (INDEPENDENT_AMBULATORY_CARE_PROVIDER_SITE_OTHER): Payer: Medicare Other | Admitting: Internal Medicine

## 2022-04-15 VITALS — BP 128/70 | HR 65 | Temp 97.9°F | Resp 17 | Ht 63.5 in | Wt 142.4 lb

## 2022-04-15 DIAGNOSIS — I10 Essential (primary) hypertension: Secondary | ICD-10-CM

## 2022-04-15 DIAGNOSIS — Z0001 Encounter for general adult medical examination with abnormal findings: Secondary | ICD-10-CM

## 2022-04-15 DIAGNOSIS — R7309 Other abnormal glucose: Secondary | ICD-10-CM | POA: Diagnosis not present

## 2022-04-15 DIAGNOSIS — Z136 Encounter for screening for cardiovascular disorders: Secondary | ICD-10-CM

## 2022-04-15 DIAGNOSIS — K219 Gastro-esophageal reflux disease without esophagitis: Secondary | ICD-10-CM

## 2022-04-15 DIAGNOSIS — Z8249 Family history of ischemic heart disease and other diseases of the circulatory system: Secondary | ICD-10-CM

## 2022-04-15 DIAGNOSIS — E782 Mixed hyperlipidemia: Secondary | ICD-10-CM

## 2022-04-15 DIAGNOSIS — E559 Vitamin D deficiency, unspecified: Secondary | ICD-10-CM | POA: Diagnosis not present

## 2022-04-15 DIAGNOSIS — Z0000 Encounter for general adult medical examination without abnormal findings: Secondary | ICD-10-CM | POA: Diagnosis not present

## 2022-04-15 DIAGNOSIS — Z1211 Encounter for screening for malignant neoplasm of colon: Secondary | ICD-10-CM

## 2022-04-15 DIAGNOSIS — Z79899 Other long term (current) drug therapy: Secondary | ICD-10-CM

## 2022-04-15 DIAGNOSIS — E890 Postprocedural hypothyroidism: Secondary | ICD-10-CM

## 2022-04-16 ENCOUNTER — Other Ambulatory Visit: Payer: Self-pay | Admitting: Internal Medicine

## 2022-04-16 DIAGNOSIS — E039 Hypothyroidism, unspecified: Secondary | ICD-10-CM

## 2022-04-16 LAB — COMPLETE METABOLIC PANEL WITH GFR
AG Ratio: 1.9 (calc) (ref 1.0–2.5)
ALT: 14 U/L (ref 6–29)
AST: 16 U/L (ref 10–35)
Albumin: 4.4 g/dL (ref 3.6–5.1)
Alkaline phosphatase (APISO): 57 U/L (ref 37–153)
BUN: 14 mg/dL (ref 7–25)
CO2: 27 mmol/L (ref 20–32)
Calcium: 9.7 mg/dL (ref 8.6–10.4)
Chloride: 106 mmol/L (ref 98–110)
Creat: 0.7 mg/dL (ref 0.60–1.00)
Globulin: 2.3 g/dL (calc) (ref 1.9–3.7)
Glucose, Bld: 85 mg/dL (ref 65–99)
Potassium: 4.5 mmol/L (ref 3.5–5.3)
Sodium: 141 mmol/L (ref 135–146)
Total Bilirubin: 0.4 mg/dL (ref 0.2–1.2)
Total Protein: 6.7 g/dL (ref 6.1–8.1)
eGFR: 88 mL/min/{1.73_m2} (ref >=60)

## 2022-04-16 LAB — CBC WITH DIFFERENTIAL/PLATELET
Absolute Monocytes: 551 cells/uL (ref 200–950)
Basophils Absolute: 42 cells/uL (ref 0–200)
Basophils Relative: 0.8 %
Eosinophils Absolute: 99 cells/uL (ref 15–500)
Eosinophils Relative: 1.9 %
HCT: 41.1 % (ref 35.0–45.0)
Hemoglobin: 14 g/dL (ref 11.7–15.5)
Lymphs Abs: 1576 cells/uL (ref 850–3900)
MCH: 31.3 pg (ref 27.0–33.0)
MCHC: 34.1 g/dL (ref 32.0–36.0)
MCV: 91.9 fL (ref 80.0–100.0)
MPV: 9.5 fL (ref 7.5–12.5)
Monocytes Relative: 10.6 %
Neutro Abs: 2933 cells/uL (ref 1500–7800)
Neutrophils Relative %: 56.4 %
Platelets: 248 10*3/uL (ref 140–400)
RBC: 4.47 10*6/uL (ref 3.80–5.10)
RDW: 12.3 % (ref 11.0–15.0)
Total Lymphocyte: 30.3 %
WBC: 5.2 10*3/uL (ref 3.8–10.8)

## 2022-04-16 LAB — URINALYSIS, ROUTINE W REFLEX MICROSCOPIC
Bacteria, UA: NONE SEEN /HPF
Bilirubin Urine: NEGATIVE
Glucose, UA: NEGATIVE
Hgb urine dipstick: NEGATIVE
Hyaline Cast: NONE SEEN /LPF
Ketones, ur: NEGATIVE
Nitrite: NEGATIVE
Protein, ur: NEGATIVE
RBC / HPF: NONE SEEN /HPF (ref 0–2)
Specific Gravity, Urine: 1.005 (ref 1.001–1.035)
Squamous Epithelial / HPF: NONE SEEN /HPF (ref <=5)
WBC, UA: NONE SEEN /HPF (ref 0–5)
pH: 6.5 (ref 5.0–8.0)

## 2022-04-16 LAB — MICROSCOPIC MESSAGE

## 2022-04-16 LAB — INSULIN, RANDOM: Insulin: 19.4 u[IU]/mL — ABNORMAL HIGH

## 2022-04-16 LAB — LIPID PANEL
Cholesterol: 154 mg/dL (ref <200)
HDL: 62 mg/dL (ref >=50)
LDL Cholesterol (Calc): 73 mg/dL (calc)
Non-HDL Cholesterol (Calc): 92 mg/dL (calc) (ref <130)
Total CHOL/HDL Ratio: 2.5 (calc) (ref <5.0)
Triglycerides: 103 mg/dL (ref <150)

## 2022-04-16 LAB — MICROALBUMIN / CREATININE URINE RATIO
Creatinine, Urine: 17 mg/dL — ABNORMAL LOW (ref 20–275)
Microalb, Ur: <0.2 mg/dL

## 2022-04-16 LAB — HEMOGLOBIN A1C
Hgb A1c MFr Bld: 5.2 % of total Hgb (ref <5.7)
Mean Plasma Glucose: 103 mg/dL
eAG (mmol/L): 5.7 mmol/L

## 2022-04-16 LAB — TSH: TSH: 0.09 mIU/L — ABNORMAL LOW (ref 0.40–4.50)

## 2022-04-16 LAB — MAGNESIUM: Magnesium: 2.1 mg/dL (ref 1.5–2.5)

## 2022-04-16 LAB — VITAMIN D 25 HYDROXY (VIT D DEFICIENCY, FRACTURES): Vit D, 25-Hydroxy: 71 ng/mL (ref 30–100)

## 2022-04-16 MED ORDER — LEVOTHYROXINE SODIUM 75 MCG PO TABS: ORAL_TABLET | ORAL | 1 refills | Status: DC

## 2022-04-16 NOTE — Progress Notes (Signed)
<><><><><><><><><><><><><><><><><><><><><><><><><><><><><><><><><> <><><><><><><><><><><><><><><><><><><><><><><><><><><><><><><><><> -   Test results slightly outside the reference range are not unusual. If there is anything important, I will review this with you,  otherwise it is considered normal test values.  If you have further questions,  please do not hesitate to contact me at the office or via My Chart.  <><><><><><><><><><><><><><><><><><><><><><><><><><><><><><><><><> <><><><><><><><><><><><><><><><><><><><><><><><><><><><><><><><><>  -  Lower TSH means thyroid hormone too high in blood , So sent new Rx for   Levothyroxine  75 mcg - to take 1 whole tablet everyday  & then   Call office to schedule a Nurse Visit to recheck thyroid in 3 to 4 weeks   <><><><><><><><><><><><><><><><><><><><><><><><><><><><><><><><><> <><><><><><><><><><><><><><><><><><><><><><><><><><><><><><><><><>  - Total Chol = 154 - Excellent - Please continue Rosuvastatin ( Crestor ) same.  <><><><><><><><><><><><><><><><><><><><><><><><><><><><><><><><><>  - A1c - Normal - No Diabetes  - Great  ! <><><><><><><><><><><><><><><><><><><><><><><><><><><><><><><><><>  - Vitamin D = 71  - Excellent - Please keep dose same  ! <><><><><><><><><><><><><><><><><><><><><><><><><><><><><><><><><>  - All Else - CBC - Kidneys - Electrolytes - Liver - Magnesium & Thyroid    - all  Normal / OK <><><><><><><><><><><><><><><><><><><><><><><><><><><><><><><><><> <><><><><><><><><><><><><><><><><><><><><><><><><><><><><><><><><>

## 2022-04-24 ENCOUNTER — Ambulatory Visit
Admission: RE | Admit: 2022-04-24 | Discharge: 2022-04-24 | Disposition: A | Discharge status: Home / Self Care | Payer: Medicare Other | Source: Ambulatory Visit | Attending: Internal Medicine | Admitting: Internal Medicine

## 2022-04-24 DIAGNOSIS — Z1231 Encounter for screening mammogram for malignant neoplasm of breast: Secondary | ICD-10-CM | POA: Diagnosis not present

## 2022-05-14 ENCOUNTER — Ambulatory Visit (INDEPENDENT_AMBULATORY_CARE_PROVIDER_SITE_OTHER): Payer: Medicare Other | Admitting: Nurse Practitioner

## 2022-05-14 ENCOUNTER — Encounter: Payer: Self-pay | Admitting: Nurse Practitioner

## 2022-05-14 VITALS — BP 138/72 | HR 71 | Temp 97.5°F | Ht 63.5 in | Wt 145.2 lb

## 2022-05-14 DIAGNOSIS — R350 Frequency of micturition: Secondary | ICD-10-CM | POA: Diagnosis not present

## 2022-05-14 DIAGNOSIS — R3915 Urgency of urination: Secondary | ICD-10-CM | POA: Diagnosis not present

## 2022-05-14 DIAGNOSIS — N3001 Acute cystitis with hematuria: Secondary | ICD-10-CM

## 2022-05-14 DIAGNOSIS — I10 Essential (primary) hypertension: Secondary | ICD-10-CM

## 2022-05-14 MED ORDER — NITROFURANTOIN MONOHYD MACRO 100 MG PO CAPS: 100.0000 mg | ORAL_CAPSULE | Freq: Two times a day (BID) | ORAL | 0 refills | Status: AC

## 2022-05-14 NOTE — Progress Notes (Signed)
Assessment and Plan:  Isabel Oliver was seen today for an episodic visit.  Diagnoses and all order for this visit:  1. Acute cystitis with hematuria Stay well hydrated. AZO OTC PRN Consider cranberry juice/supplement as prophylactic.  - Urinalysis, Routine w reflex microscopic - Urine Culture - nitrofurantoin, macrocrystal-monohydrate, (MACROBID) 100 MG capsule; Take 1 capsule (100 mg total) by mouth 2 (two) times daily for 5 days.  Dispense: 10 capsule; Refill: 0  2. Urinary frequency  - Urinalysis, Routine w reflex microscopic - Urine Culture  3. Urinary urgency  - Urinalysis, Routine w reflex microscopic - Urine Culture  4.  Hypertension Continue Verapamil Discussed DASH (Dietary Approaches to Stop Hypertension) DASH diet is lower in sodium than a typical American diet. Cut back on foods that are high in saturated fat, cholesterol, and trans fats. Eat more whole-grain foods, fish, poultry, and nuts Remain active and exercise as tolerated daily.  Monitor BP at home-Call if greater than 130/80.    Continue to monitor for any increase in fever, chills, N/V, increase in blood in urine.  Notify office for further evaluation and treatment, questions or concerns if s/s fail to improve. The risks and benefits of my recommendations, as well as other treatment options were discussed with the patient today. Questions were answered.  Further disposition pending results of labs. Discussed med's effects and SE's.    Over 15 minutes of exam, counseling, chart review, and critical decision making was performed.   Future Appointments  Date Time Provider [place]  08/30/2022 11:00 AM Alycia Rossetti, NP [facility] None  12/02/2022  9:30 AM Unk Pinto, MD [facility] None  04/17/2023 11:00 AM Unk Pinto, MD [facility] None    ------------------------------------------------------------------------------------------------------------------   HPI BP  138/72   Pulse 71   Temp (!) 97.5 F (36.4 C)   Ht 5' 3.5" (1.613 m)   Wt 145 lb 3.2 oz (65.9 kg)   SpO2 98%   BMI 25.32 kg/m    Patient complains of dysuria, frequency, hematuria, and urgency. She has had symptoms for 3 days. Patient also complains of back pain. Patient denies fever, headache, stomach ache, and vaginal discharge. Patient does not have a history of recurrent UTI. Patient does not have a history of pyelonephritis. Denies fever, N/V, fatiuge.   BP well controlled without elevated HR.  Past Medical History:  Diagnosis Date   Anemia    Asthma    GERD (gastroesophageal reflux disease)    Goiter    Hyperlipidemia    Hypertension    MVP (mitral valve prolapse)    Osteopenia    Other abnormal glucose    Positive colorectal cancer screening using Cologuard test 08/11/2017     Allergies  Allergen Reactions   Adhesive [Tape] Hives   Codeine Hives   Fosamax [Alendronate Sodium] Other (See Comments)    Reflux increase   Penicillins Hives   Shellfish Allergy    Iodine Rash    Current Outpatient Medications on File Prior to Visit  Medication Sig   albuterol (VENTOLIN HFA) 108 (90 Base) MCG/ACT inhaler Use  2 Inhalations  15 minutes  Apart  every 4 hours  as need to Rescue Asthma Attack   Ascorbic Acid (VITAMIN C) 500 MG CAPS Take 1 capsule by mouth daily.   aspirin 81 MG chewable tablet Chew 81 mg by mouth daily.   Calcium Carb-Cholecalciferol 1000-800 MG-UNIT TABS Take by mouth.   cetirizine (ZYRTEC) 10 MG tablet Take 10 mg by mouth daily.  Cholecalciferol (VITAMIN D) 2000 UNITS CAPS Take by mouth.   cyanocobalamin 500 MCG tablet Take 500 mcg by mouth daily.   diclofenac sodium (VOLTAREN) 1 % GEL APPLY 2 GRAMS TO AFFECTED AREA FOUR TIMES DAILY   levothyroxine (SYNTHROID) 75 MCG tablet Take  1 tablet  Daily  on an empty stomach with only water for 30 minutes & no Antacid meds, Calcium or Magnesium for 4 hours & avoid Biotin   Magnesium 500 MG TABS Take 1 tablet by  mouth daily.   montelukast (SINGULAIR) 10 MG tablet Take  1 tablet  Daily for Allergies                                                            /                      TAKE 1 TABLET BY MOUTH   Multiple Vitamins-Minerals (MULTIVITAMIN PO) Take by mouth daily.   Omega-3 Fatty Acids (FISH OIL PO) Take by mouth daily.   omeprazole (PRILOSEC) 20 MG capsule TAKE ONE CAPSULE BY MOUTH DAILY TO PREVENT INDIGESTION AND HEARTBURN   Probiotic Product (PROBIOTIC DAILY PO) Take by mouth daily.   rosuvastatin (CRESTOR) 20 MG tablet TAKE 1 TABLET BY MOUTH DAILY FOR CHOLESTEROL   verapamil (CALAN-SR) 120 MG CR tablet TAKE 1 TABLET BY MOUTH AT BEDTIME FOR BLOOD PRESSURE   zinc gluconate 50 MG tablet Take 50 mg by mouth daily.   No current facility-administered medications on file prior to visit.    ROS: all negative except what is noted in the HPI.   Physical Exam:  BP 138/72   Pulse 71   Temp (!) 97.5 F (36.4 C)   Ht 5' 3.5" (1.613 m)   Wt 145 lb 3.2 oz (65.9 kg)   SpO2 98%   BMI 25.32 kg/m   General Appearance: NAD.  Awake, conversant and cooperative. Eyes: PERRLA, EOMs intact.  Sclera white.  Conjunctiva without erythema. Sinuses: No frontal/maxillary tenderness.  No nasal discharge. Nares patent.  ENT/Mouth: Ext aud canals clear.  Bilateral TMs w/DOL and without erythema or bulging. Hearing intact.  Posterior pharynx without swelling or exudate.  Tonsils without swelling or erythema.  Neck: Supple.  No masses, nodules or thyromegaly. Respiratory: Effort is regular with non-labored breathing. Breath sounds are equal bilaterally without rales, rhonchi, wheezing or stridor.  Cardio: RRR with no MRGs. Brisk peripheral pulses without edema.  Abdomen: Active BS in all four quadrants.  Soft and non-tender without guarding, rebound tenderness, hernias or masses. Lymphatics: Non tender without lymphadenopathy.  Musculoskeletal: Full ROM, 5/5 strength, normal ambulation.  No clubbing or  cyanosis. Skin: Appropriate color for ethnicity. Warm without rashes, lesions, ecchymosis, ulcers.  Neuro: CN II-XII grossly normal. Normal muscle tone without cerebellar symptoms and intact sensation.   Psych: AO X 3,  appropriate mood and affect, insight and judgment.     Darrol Jump, NP 2:36 PM [facility]

## 2022-05-14 NOTE — Patient Instructions (Signed)
Urinary Tract Infection, Adult  A urinary tract infection (UTI) is an infection of any part of the urinary tract. The urinary tract includes the kidneys, ureters, bladder, and urethra. These organs make, store, and get rid of urine in the body. An upper UTI affects the ureters and kidneys. A lower UTI affects the bladder and urethra. What are the causes? Most urinary tract infections are caused by bacteria in your genital area around your urethra, where urine leaves your body. These bacteria grow and cause inflammation of your urinary tract. What increases the risk? You are more likely to develop this condition if: You have a urinary catheter that stays in place. You are not able to control when you urinate or have a bowel movement (incontinence). You are female and you: Use a spermicide or diaphragm for birth control. Have low estrogen levels. Are pregnant. You have certain genes that increase your risk. You are sexually active. You take antibiotic medicines. You have a condition that causes your flow of urine to slow down, such as: An enlarged prostate, if you are female. Blockage in your urethra. A kidney stone. A nerve condition that affects your bladder control (neurogenic bladder). Not getting enough to drink, or not urinating often. You have certain medical conditions, such as: Diabetes. A weak disease-fighting system (immunesystem). Sickle cell disease. Gout. Spinal cord injury. What are the signs or symptoms? Symptoms of this condition include: Needing to urinate right away (urgency). Frequent urination. This may include small amounts of urine each time you urinate. Pain or burning with urination. Blood in the urine. Urine that smells bad or unusual. Trouble urinating. Cloudy urine. Vaginal discharge, if you are female. Pain in the abdomen or the lower back. You may also have: Vomiting or a decreased appetite. Confusion. Irritability or tiredness. A fever or  chills. Diarrhea. The first symptom in older adults may be confusion. In some cases, they may not have any symptoms until the infection has worsened. How is this diagnosed? This condition is diagnosed based on your medical history and a physical exam. You may also have other tests, including: Urine tests. Blood tests. Tests for STIs (sexually transmitted infections). If you have had more than one UTI, a cystoscopy or imaging studies may be done to determine the cause of the infections. How is this treated? Treatment for this condition includes: Antibiotic medicine. Over-the-counter medicines to treat discomfort. Drinking enough water to stay hydrated. If you have frequent infections or have other conditions such as a kidney stone, you may need to see a health care provider who specializes in the urinary tract (urologist). In rare cases, urinary tract infections can cause sepsis. Sepsis is a life-threatening condition that occurs when the body responds to an infection. Sepsis is treated in the hospital with IV antibiotics, fluids, and other medicines. Follow these instructions at home:  Medicines Take over-the-counter and prescription medicines only as told by your health care provider. If you were prescribed an antibiotic medicine, take it as told by your health care provider. Do not stop using the antibiotic even if you start to feel better. General instructions Make sure you: Empty your bladder often and completely. Do not hold urine for long periods of time. Empty your bladder after sex. Wipe from front to back after urinating or having a bowel movement if you are female. Use each tissue only one time when you wipe. Drink enough fluid to keep your urine pale yellow. Keep all follow-up visits. This is important. Contact a health   care provider if: Your symptoms do not get better after 1-2 days. Your symptoms go away and then return. Get help right away if: You have severe pain in  your back or your lower abdomen. You have a fever or chills. You have nausea or vomiting. Summary A urinary tract infection (UTI) is an infection of any part of the urinary tract, which includes the kidneys, ureters, bladder, and urethra. Most urinary tract infections are caused by bacteria in your genital area. Treatment for this condition often includes antibiotic medicines. If you were prescribed an antibiotic medicine, take it as told by your health care provider. Do not stop using the antibiotic even if you start to feel better. Keep all follow-up visits. This is important. This information is not intended to replace advice given to you by your health care provider. Make sure you discuss any questions you have with your health care provider. Document Revised: 04/07/2020 Document Reviewed: 04/07/2020 Elsevier Patient Education  2023 [employer]vier Inc.  

## 2022-05-19 LAB — URINALYSIS, ROUTINE W REFLEX MICROSCOPIC
Bilirubin Urine: NEGATIVE
Glucose, UA: NEGATIVE
Hgb urine dipstick: NEGATIVE
Hyaline Cast: NONE SEEN /LPF
Ketones, ur: NEGATIVE
Nitrite: NEGATIVE
Protein, ur: NEGATIVE
RBC / HPF: NONE SEEN /HPF (ref 0–2)
Specific Gravity, Urine: 1.009 (ref 1.001–1.035)
Squamous Epithelial / HPF: NONE SEEN /HPF (ref <=5)
pH: 6 (ref 5.0–8.0)

## 2022-05-19 LAB — URINE CULTURE
MICRO NUMBER:: 13873356
SPECIMEN QUALITY:: ADEQUATE

## 2022-05-19 LAB — MICROSCOPIC MESSAGE

## 2022-05-30 ENCOUNTER — Other Ambulatory Visit: Payer: Self-pay

## 2022-05-30 MED ORDER — VERAPAMIL HCL ER 120 MG PO TBCR: EXTENDED_RELEASE_TABLET | ORAL | 1 refills | Status: DC

## 2022-06-12 DIAGNOSIS — Z23 Encounter for immunization: Secondary | ICD-10-CM | POA: Diagnosis not present

## 2022-07-19 ENCOUNTER — Encounter: Payer: Self-pay | Admitting: Nurse Practitioner

## 2022-07-19 ENCOUNTER — Ambulatory Visit (INDEPENDENT_AMBULATORY_CARE_PROVIDER_SITE_OTHER): Payer: Medicare Other | Admitting: Nurse Practitioner

## 2022-07-19 VITALS — BP 136/80 | HR 82 | Temp 97.9°F | Resp 16 | Ht 63.5 in | Wt 146.4 lb

## 2022-07-19 DIAGNOSIS — R1032 Left lower quadrant pain: Secondary | ICD-10-CM | POA: Diagnosis not present

## 2022-07-19 DIAGNOSIS — R6883 Chills (without fever): Secondary | ICD-10-CM | POA: Diagnosis not present

## 2022-07-19 DIAGNOSIS — K5792 Diverticulitis of intestine, part unspecified, without perforation or abscess without bleeding: Secondary | ICD-10-CM | POA: Diagnosis not present

## 2022-07-19 DIAGNOSIS — R197 Diarrhea, unspecified: Secondary | ICD-10-CM

## 2022-07-19 LAB — CBC WITH DIFFERENTIAL/PLATELET
Absolute Monocytes: 921 cells/uL (ref 200–950)
Basophils Absolute: 19 cells/uL (ref 0–200)
Basophils Relative: 0.2 %
Eosinophils Absolute: 122 cells/uL (ref 15–500)
Eosinophils Relative: 1.3 %
HCT: 37.1 % (ref 35.0–45.0)
Hemoglobin: 12.9 g/dL (ref 11.7–15.5)
Lymphs Abs: 1363 cells/uL (ref 850–3900)
MCH: 32.3 pg (ref 27.0–33.0)
MCHC: 34.8 g/dL (ref 32.0–36.0)
MCV: 92.8 fL (ref 80.0–100.0)
MPV: 9.3 fL (ref 7.5–12.5)
Monocytes Relative: 9.8 %
Neutro Abs: 6975 cells/uL (ref 1500–7800)
Neutrophils Relative %: 74.2 %
Platelets: 244 10*3/uL (ref 140–400)
RBC: 4 10*6/uL (ref 3.80–5.10)
RDW: 11.8 % (ref 11.0–15.0)
Total Lymphocyte: 14.5 %
WBC: 9.4 10*3/uL (ref 3.8–10.8)

## 2022-07-19 LAB — COMPLETE METABOLIC PANEL WITH GFR
AG Ratio: 1.7 (calc) (ref 1.0–2.5)
ALT: 16 U/L (ref 6–29)
AST: 13 U/L (ref 10–35)
Albumin: 3.8 g/dL (ref 3.6–5.1)
Alkaline phosphatase (APISO): 60 U/L (ref 37–153)
BUN: 7 mg/dL (ref 7–25)
CO2: 27 mmol/L (ref 20–32)
Calcium: 9.1 mg/dL (ref 8.6–10.4)
Chloride: 105 mmol/L (ref 98–110)
Creat: 0.7 mg/dL (ref 0.60–1.00)
Globulin: 2.3 g/dL (calc) (ref 1.9–3.7)
Glucose, Bld: 105 mg/dL — ABNORMAL HIGH (ref 65–99)
Potassium: 3.3 mmol/L — ABNORMAL LOW (ref 3.5–5.3)
Sodium: 139 mmol/L (ref 135–146)
Total Bilirubin: 0.4 mg/dL (ref 0.2–1.2)
Total Protein: 6.1 g/dL (ref 6.1–8.1)
eGFR: 88 mL/min/{1.73_m2} (ref >=60)

## 2022-07-19 MED ORDER — METRONIDAZOLE 500 MG PO TABS: 500.0000 mg | ORAL_TABLET | Freq: Two times a day (BID) | ORAL | 0 refills | Status: AC

## 2022-07-19 MED ORDER — CIPROFLOXACIN HCL 750 MG PO TABS: 750.0000 mg | ORAL_TABLET | Freq: Two times a day (BID) | ORAL | 0 refills | Status: AC

## 2022-07-19 NOTE — Progress Notes (Signed)
Assessment and Plan:  Sallyanne Birkhead Willet was seen today for an episodic visit.  Diagnoses and all order for this visit:  1. Diverticulitis Start tmt with abx. Stay well hydrated.  - CBC with Differential/Platelet - ciprofloxacin (CIPRO) 750 MG tablet; Take 1 tablet (750 mg total) by mouth 2 (two) times daily for 7 days.  Dispense: 14 tablet; Refill: 0 - metroNIDAZOLE (FLAGYL) 500 MG tablet; Take 1 tablet (500 mg total) by mouth 2 (two) times daily for 7 days.  Dispense: 14 tablet; Refill: 0  2. Diarrhea of presumed infectious origin Monitor for any increase in bloody stools or unresolving diarrhea once treatment starts.  Stay well hydrated; suggested Gatorade or Pedialyte.  - COMPLETE METABOLIC PANEL WITH GFR  3. Left lower quadrant abdominal pain Tylenol OTC as directed. Continue to monitor  4. Chills Monitor for increase in fever Continue Tylenol If not response to medication report to ER  Report to ER for any increase in no response in 8-72 hours or symptoms worsen (high fever, toxic).  Can be life-threatening if any possible abscess ruptures.    The risks and benefits of my recommendations, as well as other treatment options were discussed with the patient today. Questions were answered.  Further disposition pending results of labs. Discussed med's effects and SE's.    Over 20 minutes of exam, counseling, chart review, and critical decision making was performed.   Future Appointments  Date Time Provider [place]  08/30/2022 11:00 AM Alycia Rossetti, NP [facility] None  12/02/2022  9:30 AM Unk Pinto, MD [facility] None  04/17/2023 11:00 AM Unk Pinto, MD [facility] None    ------------------------------------------------------------------------------------------------------------------   HPI BP 136/80   Pulse 82   Temp 97.9 F (36.6 C)   Resp 16   Ht 5' 3.5" (1.613 m)   Wt 146 lb 6.4 oz (66.4 kg)   SpO2 94%   BMI 25.53 kg/m   81  y.o.female presents for severe abd pain with diarrhea, [place] type 7, onset x 4 days ago.  She reports having a "handful" of popcorn over the weekend which she fears triggered her symptoms. She and her son both had the same meals over the last few days, and he is feeling well.  States that she had been taking and "older medication" Diphenoxylate/atropine that she had on hand without relief.  Reports that most recent Colonoscopy showed some diverticulum.  She has had associated lower quadrant abd pain, L>R, clammy skin, chills and nausea  Denies blood in stool.    Past Medical History:  Diagnosis Date   Anemia    Asthma    GERD (gastroesophageal reflux disease)    Goiter    Hyperlipidemia    Hypertension    MVP (mitral valve prolapse)    Osteopenia    Other abnormal glucose    Positive colorectal cancer screening using Cologuard test 08/11/2017     Allergies  Allergen Reactions   Adhesive [Tape] Hives   Codeine Hives   Fosamax [Alendronate Sodium] Other (See Comments)    Reflux increase   Penicillins Hives   Shellfish Allergy    Iodine Rash    Current Outpatient Medications on File Prior to Visit  Medication Sig   albuterol (VENTOLIN HFA) 108 (90 Base) MCG/ACT inhaler Use  2 Inhalations  15 minutes  Apart  every 4 hours  as need to Rescue Asthma Attack   Ascorbic Acid (VITAMIN C) 500 MG CAPS Take 1 capsule by mouth daily.   aspirin 81  MG chewable tablet Chew 81 mg by mouth daily.   Calcium Carb-Cholecalciferol 1000-800 MG-UNIT TABS Take by mouth.   cetirizine (ZYRTEC) 10 MG tablet Take 10 mg by mouth daily.   Cholecalciferol (VITAMIN D) 2000 UNITS CAPS Take by mouth.   cyanocobalamin 500 MCG tablet Take 500 mcg by mouth daily.   diclofenac sodium (VOLTAREN) 1 % GEL APPLY 2 GRAMS TO AFFECTED AREA FOUR TIMES DAILY   levothyroxine (SYNTHROID) 75 MCG tablet Take  1 tablet  Daily  on an empty stomach with only water for 30 minutes & no Antacid meds, Calcium or Magnesium for 4 hours &  avoid Biotin   Magnesium 500 MG TABS Take 1 tablet by mouth daily.   montelukast (SINGULAIR) 10 MG tablet Take  1 tablet  Daily for Allergies                                                            /                      TAKE 1 TABLET BY MOUTH   Multiple Vitamins-Minerals (MULTIVITAMIN PO) Take by mouth daily.   Omega-3 Fatty Acids (FISH OIL PO) Take by mouth daily.   omeprazole (PRILOSEC) 20 MG capsule TAKE ONE CAPSULE BY MOUTH DAILY TO PREVENT INDIGESTION AND HEARTBURN   Probiotic Product (PROBIOTIC DAILY PO) Take by mouth daily.   rosuvastatin (CRESTOR) 20 MG tablet TAKE 1 TABLET BY MOUTH DAILY FOR CHOLESTEROL   verapamil (CALAN-SR) 120 MG CR tablet TAKE 1 TABLET BY MOUTH AT BEDTIME FOR BLOOD PRESSURE   zinc gluconate 50 MG tablet Take 50 mg by mouth daily.   No current facility-administered medications on file prior to visit.    ROS: all negative except what is noted in the HPI.   Physical Exam:  BP 136/80   Pulse 82   Temp 97.9 F (36.6 C)   Resp 16   Ht 5' 3.5" (1.613 m)   Wt 146 lb 6.4 oz (66.4 kg)   SpO2 94%   BMI 25.53 kg/m   General Appearance: NAD.  Awake, conversant and cooperative. Eyes: PERRLA, EOMs intact.  Sclera white.  Conjunctiva without erythema. Sinuses: No frontal/maxillary tenderness.  No nasal discharge. Nares patent.  ENT/Mouth: Ext aud canals clear.  Bilateral TMs w/DOL and without erythema or bulging. Hearing intact.  Posterior pharynx without swelling or exudate.  Tonsils without swelling or erythema.  Neck: Supple.  No masses, nodules or thyromegaly. Respiratory: Effort is regular with non-labored breathing. Breath sounds are equal bilaterally without rales, rhonchi, wheezing or stridor.  Cardio: RRR with no MRGs. Brisk peripheral pulses without edema.  Abdomen: Guarding to LLQ upon palpation.  Generalized tenderness to all other quadrants upon palpation.  Active BS in all four quadrants.   Lymphatics: Non tender without lymphadenopathy.   Musculoskeletal: Full ROM, 5/5 strength, normal ambulation.  No clubbing or cyanosis. Skin: Clammy. Appropriate color for ethnicity. Warm without rashes, lesions, ecchymosis, ulcers.  Neuro: CN II-XII grossly normal. Normal muscle tone without cerebellar symptoms and intact sensation.   Psych: AO X 3,  appropriate mood and affect, insight and judgment.     Darrol Jump, NP 10:58 AM [facility]

## 2022-07-19 NOTE — Patient Instructions (Signed)
Diverticulitis  Diverticulitis is when small pouches in your colon (large intestine) get infected or swollen. This causes pain in the belly (abdomen) and watery poop (diarrhea). These pouches are called diverticula. The pouches form in people who have a condition called diverticulosis. What are the causes? This condition may be caused by poop (stool) that gets trapped in the pouches in your colon. The poop lets germs (bacteria) grow in the pouches. This causes the infection. What increases the risk? You are more likely to get this condition if you have small pouches in your colon. The risk is higher if: You are overweight or very overweight (obese). You do not exercise enough. You drink alcohol. You smoke or use products with tobacco in them. You eat a diet that has a lot of red meat such as beef, pork, or lamb. You eat a diet that does not have enough fiber in it. You are older than 80 years of age. What are the signs or symptoms? Pain in the belly. Pain is often on the left side, but it may be in other areas. Fever and feeling cold. Feeling like you may vomit. Vomiting. Having cramps. Feeling full. Changes to how often you poop. Blood in your poop. How is this treated? Most cases are treated at home by: Taking over-the-counter pain medicines. Following a clear liquid diet. Taking antibiotic medicines. Resting. Very bad cases may need to be treated at a hospital. This may include: Not eating or drinking. Taking prescription pain medicine. Getting antibiotic medicines through an IV tube. Getting fluid and food through an IV tube. Having surgery. When you are feeling better, your doctor may tell you to have a test to check your colon (colonoscopy). Follow these instructions at home: Medicines Take over-the-counter and prescription medicines only as told by your doctor. These include: Antibiotics. Pain medicines. Fiber pills. Probiotics. Stool softeners. If you were  prescribed an antibiotic medicine, take it as told by your doctor. Do not stop taking the antibiotic even if you start to feel better. Ask your doctor if the medicine prescribed to you requires you to avoid driving or using machinery. Eating and drinking  Follow a diet as told by your doctor. When you feel better, your doctor may tell you to change your diet. You may need to eat a lot of fiber. Fiber makes it easier to poop (have a bowel movement). Foods with fiber include: Berries. Beans. Lentils. Green vegetables. Avoid eating red meat. General instructions Do not use any products that contain nicotine or tobacco, such as cigarettes, e-cigarettes, and chewing tobacco. If you need help quitting, ask your doctor. Exercise 3 or more times a week. Try to get 30 minutes each time. Exercise enough to sweat and make your heart beat faster. Keep all follow-up visits as told by your doctor. This is important. Contact a doctor if: Your pain does not get better. You are not pooping like normal. Get help right away if: Your pain gets worse. Your symptoms do not get better. Your symptoms get worse very fast. You have a fever. You vomit more than one time. You have poop that is: Bloody. Black. Tarry. Summary This condition happens when small pouches in your colon get infected or swollen. Take medicines only as told by your doctor. Follow a diet as told by your doctor. Keep all follow-up visits as told by your doctor. This is important. This information is not intended to replace advice given to you by your health care provider. Make sure   you discuss any questions you have with your health care provider. Document Revised: 06/07/2019 Document Reviewed: 06/07/2019 Elsevier Patient Education  2023 [employer]vier Inc.  

## 2022-07-24 ENCOUNTER — Other Ambulatory Visit: Payer: Self-pay

## 2022-07-24 MED ORDER — ALBUTEROL SULFATE HFA 108 (90 BASE) MCG/ACT IN AERS: INHALATION_SPRAY | RESPIRATORY_TRACT | 3 refills | Status: DC

## 2022-07-25 ENCOUNTER — Other Ambulatory Visit: Payer: Medicare Other

## 2022-07-25 DIAGNOSIS — E876 Hypokalemia: Secondary | ICD-10-CM | POA: Diagnosis not present

## 2022-07-26 LAB — POTASSIUM: Potassium: 4.7 mmol/L (ref 3.5–5.3)

## 2022-08-28 ENCOUNTER — Other Ambulatory Visit: Payer: Self-pay | Admitting: Internal Medicine

## 2022-08-30 ENCOUNTER — Ambulatory Visit: Payer: Medicare Other | Admitting: Nurse Practitioner

## 2022-09-03 ENCOUNTER — Ambulatory Visit (INDEPENDENT_AMBULATORY_CARE_PROVIDER_SITE_OTHER): Payer: Medicare Other | Admitting: Nurse Practitioner

## 2022-09-03 ENCOUNTER — Encounter: Payer: Self-pay | Admitting: Nurse Practitioner

## 2022-09-03 VITALS — BP 132/80 | HR 66 | Temp 97.2°F | Ht 63.5 in | Wt 144.0 lb

## 2022-09-03 DIAGNOSIS — J45909 Unspecified asthma, uncomplicated: Secondary | ICD-10-CM

## 2022-09-03 DIAGNOSIS — Z0001 Encounter for general adult medical examination with abnormal findings: Secondary | ICD-10-CM | POA: Diagnosis not present

## 2022-09-03 DIAGNOSIS — R6889 Other general symptoms and signs: Secondary | ICD-10-CM

## 2022-09-03 DIAGNOSIS — I341 Nonrheumatic mitral (valve) prolapse: Secondary | ICD-10-CM | POA: Diagnosis not present

## 2022-09-03 DIAGNOSIS — E785 Hyperlipidemia, unspecified: Secondary | ICD-10-CM | POA: Diagnosis not present

## 2022-09-03 DIAGNOSIS — Z79899 Other long term (current) drug therapy: Secondary | ICD-10-CM

## 2022-09-03 DIAGNOSIS — I10 Essential (primary) hypertension: Secondary | ICD-10-CM | POA: Diagnosis not present

## 2022-09-03 DIAGNOSIS — E559 Vitamin D deficiency, unspecified: Secondary | ICD-10-CM

## 2022-09-03 DIAGNOSIS — D34 Benign neoplasm of thyroid gland: Secondary | ICD-10-CM | POA: Diagnosis not present

## 2022-09-03 DIAGNOSIS — Z0000 Encounter for general adult medical examination without abnormal findings: Secondary | ICD-10-CM

## 2022-09-03 DIAGNOSIS — M810 Age-related osteoporosis without current pathological fracture: Secondary | ICD-10-CM

## 2022-09-03 DIAGNOSIS — E663 Overweight: Secondary | ICD-10-CM

## 2022-09-03 NOTE — Patient Instructions (Signed)

## 2022-09-03 NOTE — Progress Notes (Signed)
Medicare wellness and OV  Assessment:   Encounter for Medicare annual wellness exam Due Annually Health maintenance reviewed  Essential hypertension Discussed DASH (Dietary Approaches to Stop Hypertension) DASH diet is lower in sodium than a typical American diet. Cut back on foods that are high in saturated fat, cholesterol, and trans fats. Eat more whole-grain foods, fish, poultry, and nuts Remain active and exercise as tolerated daily.  Monitor BP at home-Call if greater than 130/80.  Check CMP/CBC   Hyperlipidemia, unspecified hyperlipidemia type Discussed lifestyle modifications. Recommended diet heavy in fruits and veggies, omega 3's. Decrease consumption of animal meats, cheeses, and dairy products. Remain active and exercise as tolerated. Continue to monitor. Check lipids/TSH   Prediabetes Discussed disease progression and risks Discussed diet/exercise, weight management and risk modification  Medication management All medications discussed and reviewed in full. All questions and concerns regarding medications addressed.    Anemia, unspecified type Monitor CBC  Gastroesophageal reflux disease, esophagitis presence not specified No suspected reflux complications (Barret/stricture). Lifestyle modification:  wt loss, avoid meals 2-3h before bedtime. Consider eliminating food triggers:  chocolate, caffeine, EtOH, acid/spicy food.  Uncomplicated asthma, unspecified asthma severity, unspecified whether persistent No recent flare Monitor, controlled  MVP (mitral valve prolapse) Controlled Monitor  Vitamin D deficiency Continue supplement Monitor levels at CPE  BMI 23.0-23.9, adult Discussed appropriate BMI Diet modification. Physical activity. Encouraged/praised to build confidence.  Osteoporosis Pursue a combination of weight-bearing exercises and strength training. Advised on fall prevention measures including proper lighting in all rooms, removal of  area rugs and floor clutter, use of walking devices as deemed appropriate, avoidance of uneven walking surfaces. Smoking cessation and moderate alcohol consumption if applicable Consume 732 to 1000 IU of vitamin D daily with a goal vitamin D serum value of 30 ng/mL or higher. Aim for 1000 to 1200 mg of elemental calcium daily through supplements and/or dietary sources.  Orders Placed This Encounter  Procedures   DG Bone Density    Standing Status:   Future    Standing Expiration Date:   09/04/2023    Order Specific Question:   Reason for Exam (SYMPTOM  OR DIAGNOSIS REQUIRED)    Answer:   Osteoporosis    Order Specific Question:   Preferred imaging location?    Answer:   [facility]   CBC with Differential/Platelet   COMPLETE METABOLIC PANEL WITH GFR   Lipid panel   TSH    Notify office for further evaluation and treatment, questions or concerns if any reported s/s fail to improve.   The patient was advised to call back or seek an in-person evaluation if any symptoms worsen or if the condition fails to improve as anticipated.   Further disposition pending results of labs. Discussed med's effects and SE's.    I discussed the assessment and treatment plan with the patient. The patient was provided an opportunity to ask questions and all were answered. The patient agreed with the plan and demonstrated an understanding of the instructions.  Discussed med's effects and SE's. Screening labs and tests as requested with regular follow-up as recommended.  I provided 40 minutes of face-to-face time during this encounter including counseling, chart review, and critical decision making was preformed.   Future Appointments  Date Time Provider [place]  12/02/2022  9:30 AM Unk Pinto, MD [facility] None  04/17/2023 11:00 AM Unk Pinto, MD [facility] None  09/04/2023 11:00 AM Darrol Jump, NP [facility] None     Plan:   During the course of  the visit the patient  was educated and counseled about appropriate screening and preventive services including:   Pneumococcal vaccine  Influenza vaccine Td vaccine Screening electrocardiogram Screening mammography Bone densitometry screening Colorectal cancer screening Diabetes screening Glaucoma screening Nutrition counseling   Subjective:   Isabel Oliver is a  81 y.o. WMF who presents for Medicare Annual Wellness Visit and follow up for chronic illness including HTN, chol, preDM, vitamin D def, osteoporosis.    Overall she reports feeling well today.  Recently treated for diverticulitis flare.  Completed abx therapy.  Advancing diet as tolerating.    Had + cologuard with subsequent colonoscopy 04/2018, benign polyp, 5 [id number]) with Dr. Oletta Lamas.   Her blood pressure has been controlled at home, today their BP is BP: 132/80 BP Readings from Last 3 Encounters:  09/03/22 132/80  07/19/22 136/80  05/14/22 138/72    She does workout, and continues to walk about a mile daily. Denies shortness of breath, dizziness. She has asthma, does inhaler once every day in the AM.   She is on cholesterol medication and denies myalgias. Her cholesterol is not at goal. The cholesterol last visit was:   Lab Results  Component Value Date   CHOL 154 04/15/2022   HDL 62 04/15/2022   LDLCALC 73 04/15/2022   TRIG 103 04/15/2022   CHOLHDL 2.5 04/15/2022    Last A1C in the office was:  Lab Results  Component Value Date   HGBA1C 5.2 04/15/2022   Patient is on Vitamin D supplement, 2000 international units she thinks..   Lab Results  Component Value Date   VD25OH 28 04/15/2022   She has history of osteoporosis, could not tolerate calcitonin or fosamax due to her GERD.  BMI is Body mass index is 25.11 kg/m. weight is up some, contributes it to the holidays. Wt Readings from Last 3 Encounters:  09/03/22 144 lb (65.3 kg)  07/19/22 146 lb 6.4 oz (66.4 kg)  05/14/22 145 lb 3.2 oz (65.9 kg)     Names  of Other Physician/Practitioners you currently use: 1. [facility]- here for primary care 2. Dr. Katy Fitch, eye doctor, 2021 3. Dr Baldomero Lamy. In [place], dentist, last visit q 6 months 03/2021 Patient Care Team: Unk Pinto, MD as PCP - General (Internal Medicine) Laurence Spates, MD (Inactive) as Consulting Physician (Gastroenterology) Gus Height, MD (Inactive) as Consulting Physician (Obstetrics and Gynecology) Danella Sensing, MD as Consulting Physician (Dermatology)   Medication Review Current Outpatient Medications on File Prior to Visit  Medication Sig Dispense Refill   albuterol (VENTOLIN HFA) 108 (90 Base) MCG/ACT inhaler Use  2 Inhalations  15 minutes  Apart  every 4 hours  as need to Rescue Asthma Attack 54 g 3   Ascorbic Acid (VITAMIN C) 500 MG CAPS Take 1 capsule by mouth daily.     aspirin 81 MG chewable tablet Chew 81 mg by mouth daily.     Calcium Carb-Cholecalciferol 1000-800 MG-UNIT TABS Take by mouth.     cetirizine (ZYRTEC) 10 MG tablet Take 10 mg by mouth daily.     Cholecalciferol (VITAMIN D) 2000 UNITS CAPS Take by mouth.     cyanocobalamin 500 MCG tablet Take 500 mcg by mouth daily.     diclofenac sodium (VOLTAREN) 1 % GEL APPLY 2 GRAMS TO AFFECTED AREA FOUR TIMES DAILY 100 g 0   levothyroxine (SYNTHROID) 75 MCG tablet Take  1 tablet  Daily  on an empty stomach with only water for 30  minutes & no Antacid meds, Calcium or Magnesium for 4 hours & avoid Biotin 90 tablet 1   Magnesium 500 MG TABS Take 1 tablet by mouth daily.     montelukast (SINGULAIR) 10 MG tablet TAKE 1 TABLET BY MOUTH DAILY FOR ALLERGIES 90 tablet 3   Multiple Vitamins-Minerals (MULTIVITAMIN PO) Take by mouth daily.     Omega-3 Fatty Acids (FISH OIL PO) Take by mouth daily.     omeprazole (PRILOSEC) 20 MG capsule TAKE ONE CAPSULE BY MOUTH DAILY TO PREVENT INDIGESTION AND HEARTBURN 90 capsule 3   Probiotic Product (PROBIOTIC DAILY PO) Take by mouth daily.      rosuvastatin (CRESTOR) 20 MG tablet TAKE 1 TABLET BY MOUTH DAILY FOR CHOLESTEROL 90 tablet 3   verapamil (CALAN-SR) 120 MG CR tablet TAKE 1 TABLET BY MOUTH AT BEDTIME FOR BLOOD PRESSURE 90 tablet 1   zinc gluconate 50 MG tablet Take 50 mg by mouth daily.     No current facility-administered medications on file prior to visit.    Current Problems (verified) Patient Active Problem List   Diagnosis Date Noted   Hyperthyroidism 06/09/2019   Encounter for Medicare annual wellness exam 03/29/2015   Medication management 12/05/2014   Vitamin D deficiency 12/05/2014   Other abnormal glucose    MVP (mitral valve prolapse)    Hypertension    Hyperlipidemia    GERD (gastroesophageal reflux disease)    Asthma     Screening Tests Immunization History  Administered Date(s) Administered   Influenza, High Dose Seasonal PF 07/14/2014, 05/21/2016, 06/20/2017, 05/19/2019, 06/06/2020, 06/05/2021, 06/12/2022   Influenza-Unspecified 06/28/2013, 07/06/2015, 06/09/2018   Moderna Covid-19 Vaccine Bivalent Booster 47yr & up 06/05/2021   Moderna SARS-COV2 Booster Vaccination 10/19/2019, 07/12/2020, 07/12/2020, 06/05/2021   Moderna Sars-Covid-2 Vaccination 09/30/2019, 10/19/2019   PNEUMOCOCCAL CONJUGATE-20 08/30/2021   Pfizer Covid-19 Vaccine Bivalent Booster 127yr& up 06/12/2022   Pneumococcal Conjugate-13 02/01/2014   Pneumococcal-Unspecified 09/10/2007   Tdap 12/05/2009, 08/30/2020   Zoster, Live 09/09/2005   Preventative care: Last colonoscopy: 04/2018 due 2024 Last mammogram: 04/2022 negative Last pap smear/pelvic exam: 2011 remote DEXA: 10/25/19 osteoporosis- on D 3 and calcium Due  Prior vaccinations: TD or Tdap: 2021  Influenza: 06/2022  Pneumococcal 20: 08/30/21 RSV:  06/2022 Prevnar 13: 2015, 06/2022 Shingles/Zostavax: 2007- suggest new vaccine  Allergies Allergies  Allergen Reactions   Adhesive [Tape] Hives   Codeine Hives   Fosamax [Alendronate Sodium] Other (See Comments)     Reflux increase   Penicillins Hives   Shellfish Allergy    Iodine Rash    SURGICAL HISTORY She  has a past surgical history that includes Appendectomy; Cholecystectomy; Eye surgery; and Tonsillectomy and adenoidectomy. FAMILY HISTORY Her family history includes Diabetes in her father; Heart disease in her father; Hyperlipidemia in her brother and sister; Hypertension in her mother. SOCIAL HISTORY She  reports that she has never smoked. She has never used smokeless tobacco. She reports that she does not drink alcohol and does not use drugs.  MEDICARE WELLNESS OBJECTIVES: Physical activity:   Cardiac risk factors:   Depression/mood screen:      09/03/2022   11:53 AM  Depression screen PHQ 2/9  Decreased Interest 0  Down, Depressed, Hopeless 0  PHQ - 2 Score 0    ADLs:     09/03/2022   11:51 AM 04/13/2022   11:21 PM  In your present state of health, do you have any difficulty performing the following activities:  Hearing? 0 0  Vision? 0 0  Difficulty concentrating or making decisions? 0 0  Walking or climbing stairs? 0 0  Dressing or bathing? 0 0  Doing errands, shopping? 0 0  Preparing Food and eating ? N   Using the Toilet? N   In the past six months, have you accidently leaked urine? N   Do you have problems with loss of bowel control? N   Managing your Medications? N   Managing your Finances? N   Housekeeping or managing your Housekeeping? N      Cognitive Testing  Alert? Yes  Normal Appearance?Yes  Oriented to person? Yes  Place? Yes   Time? Yes  Recall of three objects?  Yes  Can perform simple calculations? Yes  Displays appropriate judgment?Yes  Can read the correct time from a watch face?Yes  EOL planning: Does Patient Have a Medical Advance Directive?: Yes   Objective:     Blood pressure 132/80, pulse 66, temperature (!) 97.2 F (36.2 C), height 5' 3.5" (1.613 m), weight 144 lb (65.3 kg), SpO2 95 %. Body mass index is 25.11 kg/m.  General  appearance: alert, no distress, WD/WN,  female HEENT: normocephalic, sclerae anicteric, TMs pearly, nares patent, no discharge or erythema, pharynx normal Oral cavity: MMM, no lesions Neck: supple, no lymphadenopathy, no thyromegaly, no masses Heart: RRR, normal S1, S2, no murmurs Lungs: CTA bilaterally, no wheezes, rhonchi, or rales Abdomen: +bs, soft, non tender, non distended, no masses, no hepatomegaly, no splenomegaly Musculoskeletal:  no swelling, no obvious deformity. Maximal tenderness over greater trochanter left hip Extremities: no edema, no cyanosis, no clubbing Pulses: 2+ symmetric, upper and lower extremities, normal cap refill Neurological: alert, oriented x 3, CN2-12 intact, strength normal upper extremities and lower extremities, sensation normal throughout, DTRs 2+ throughout, no cerebellar signs, gait normal Psychiatric: normal affect, behavior normal, pleasant  Breast: defer Gyn: defer Rectal: defer  Left greater trochanter was prepped with betadine. A trigger point injection was performed at the site of maximal tenderness of left greater trochanter bursa using 1% plain Lidocaine and dexamethasone. This was well tolerated, and followed by moderate relief of pain.      Medicare Attestation I have personally reviewed: The patient's medical and social history Their use of alcohol, tobacco or illicit drugs Their current medications and supplements The patient's functional ability including ADLs,fall risks, home safety risks, cognitive, and hearing and visual impairment Diet and physical activities Evidence for depression or mood disorders  The patient's weight, height, BMI, and visual acuity have been recorded in the chart.  I have made referrals, counseling, and provided education to the patient based on review of the above and I have provided the patient with a written personalized care plan for preventive services.     Darrol Jump, NP   09/03/2022

## 2022-09-04 LAB — CBC WITH DIFFERENTIAL/PLATELET
Absolute Monocytes: 660 cells/uL (ref 200–950)
Basophils Absolute: 33 cells/uL (ref 0–200)
Basophils Relative: 0.5 %
Eosinophils Absolute: 119 cells/uL (ref 15–500)
Eosinophils Relative: 1.8 %
HCT: 39.9 % (ref 35.0–45.0)
Hemoglobin: 13.5 g/dL (ref 11.7–15.5)
Lymphs Abs: 1835 cells/uL (ref 850–3900)
MCH: 31.8 pg (ref 27.0–33.0)
MCHC: 33.8 g/dL (ref 32.0–36.0)
MCV: 94.1 fL (ref 80.0–100.0)
MPV: 9.2 fL (ref 7.5–12.5)
Monocytes Relative: 10 %
Neutro Abs: 3953 cells/uL (ref 1500–7800)
Neutrophils Relative %: 59.9 %
Platelets: 280 10*3/uL (ref 140–400)
RBC: 4.24 10*6/uL (ref 3.80–5.10)
RDW: 11.8 % (ref 11.0–15.0)
Total Lymphocyte: 27.8 %
WBC: 6.6 10*3/uL (ref 3.8–10.8)

## 2022-09-04 LAB — COMPLETE METABOLIC PANEL WITH GFR
AG Ratio: 1.8 (calc) (ref 1.0–2.5)
ALT: 17 U/L (ref 6–29)
AST: 20 U/L (ref 10–35)
Albumin: 4.4 g/dL (ref 3.6–5.1)
Alkaline phosphatase (APISO): 57 U/L (ref 37–153)
BUN: 15 mg/dL (ref 7–25)
CO2: 29 mmol/L (ref 20–32)
Calcium: 10.1 mg/dL (ref 8.6–10.4)
Chloride: 104 mmol/L (ref 98–110)
Creat: 0.76 mg/dL (ref 0.60–0.95)
Globulin: 2.5 g/dL (calc) (ref 1.9–3.7)
Glucose, Bld: 82 mg/dL (ref 65–99)
Potassium: 4.6 mmol/L (ref 3.5–5.3)
Sodium: 140 mmol/L (ref 135–146)
Total Bilirubin: 0.5 mg/dL (ref 0.2–1.2)
Total Protein: 6.9 g/dL (ref 6.1–8.1)
eGFR: 79 mL/min/{1.73_m2} (ref >=60)

## 2022-09-04 LAB — LIPID PANEL
Cholesterol: 166 mg/dL (ref <200)
HDL: 60 mg/dL (ref >=50)
LDL Cholesterol (Calc): 85 mg/dL (calc)
Non-HDL Cholesterol (Calc): 106 mg/dL (calc) (ref <130)
Total CHOL/HDL Ratio: 2.8 (calc) (ref <5.0)
Triglycerides: 115 mg/dL (ref <150)

## 2022-09-04 LAB — TSH: TSH: 1.68 mIU/L (ref 0.40–4.50)

## 2022-10-10 ENCOUNTER — Other Ambulatory Visit: Payer: Self-pay | Admitting: Internal Medicine

## 2022-10-10 DIAGNOSIS — E039 Hypothyroidism, unspecified: Secondary | ICD-10-CM

## 2022-11-26 ENCOUNTER — Other Ambulatory Visit: Payer: Self-pay | Admitting: Nurse Practitioner

## 2022-11-26 DIAGNOSIS — K219 Gastro-esophageal reflux disease without esophagitis: Secondary | ICD-10-CM

## 2022-11-26 DIAGNOSIS — E782 Mixed hyperlipidemia: Secondary | ICD-10-CM

## 2022-11-26 MED ORDER — ROSUVASTATIN CALCIUM 20 MG PO TABS: ORAL_TABLET | ORAL | 3 refills | Status: DC

## 2022-11-26 MED ORDER — OMEPRAZOLE 20 MG PO CPDR: DELAYED_RELEASE_CAPSULE | ORAL | 3 refills | Status: DC

## 2022-11-26 NOTE — Addendum Note (Signed)
Addended by: Chancy Hurter on: 11/26/2022 11:05 AM   Modules accepted: Orders

## 2022-12-01 ENCOUNTER — Encounter: Payer: Self-pay | Admitting: Internal Medicine

## 2022-12-01 NOTE — Progress Notes (Unsigned)
Future Appointments  Date Time Provider Department  12/02/2022  9:30 AM Unk Pinto, MD [facility]  04/17/2023 11:00 AM Unk Pinto, MD [facility]  09/04/2023 11:00 AM Darrol Jump, NP [facility]    History of Present Illness:       This very nice 82 y.o. MWF presents for 6 month follow up with HTN, HLD, Hypothyroidism,  Pre-Diabetes and Vitamin D Deficiency.  Patient has GERD controlled on her meds.         Patient is treated for HTN  since  & BP has been controlled at home. Today's  BP is at goal - 120/78.   Patient has had no complaints of any cardiac type chest pain, palpitations, dyspnea / orthopnea / PND, dizziness, claudication, or dependent edema.        Hyperlipidemia is controlled with diet & meds. Patient denies myalgias or other med SE's. Last Lipids were at goal :  Lab Results  Component Value Date   CHOL 166 09/03/2022   HDL 60 09/03/2022   LDLCALC 85 09/03/2022   TRIG 115 09/03/2022   CHOLHDL 2.8 09/03/2022      Also, the patient has history of PreDiabetes  (A1c 6.0% /2014)  and has had no symptoms of reactive hypoglycemia, diabetic polys, paresthesias or visual blurring.  Last A1c was at goal :  Lab Results  Component Value Date   HGBA1C 5.2 04/15/2022         In Apr/May 2022, patient was treated with RAI*131 and then started on thyroid replacement.                                                    Further, the patient also has history of Vitamin D Deficiency and supplements vitamin D . Last vitamin D wa  at goal :  Lab Results  Component Value Date   VD25OH 71 04/15/2022     Current Outpatient Medications on File Prior to Visit  Medication Sig   albuterol HFA inhaler Use  2 Inhalations  every 4 hours  as needed to Rescue Asthma    VITAMIN C 500 MG CAPS Take 1 capsule \\daily .   aspirin 81 MG chewable tablet Chew  daily.    Calcium Carb-Cholecalciferol 1000-800 MG-UNIT TABS Take daily   cetirizine  10 MG tablet Take daily.   VITAMIN D 2000 UNITS  Take   cyanocobalamin 500 MCG tablet Take   daily.   diclofenac 1 % GEL APPLY 2 GRAMS TO AFFECTED AREA FOUR TIMES DAILY   levothyroxine  75 MCG tablet Take  1 tablet  Daily    Magnesium 500 MG TABS Take 1 tablet  daily.   montelukast  10 MG tablet TAKE 1 TABLET BY MOUTH DAILY FOR ALLERGIES   Multiple Vitamins-Minerals Take by mouth daily.   Omega-3 FISH OIL Take by mouth daily.   omeprazole  20 MG capsule TAKE ONE CAPSULE BY MOUTH DAILY TO PREVENT INDIGESTION AND HEARTBURN   Probiotic Product  Take by mouth daily.   rosuvastatin  20 MG tablet TAKE 1 TABLET BY MOUTH DAILY FOR CHOLESTEROL   verapamil \ 120 MG CR tablet TAKE 1 TABLET BY MOUTH AT BEDTIME FOR BLOOD PRESSURE   zinc \\50 MG tablet Take 50 mg by mouth daily.     Allergies  Allergen Reactions   Adhesive [Tape] Hives  Codeine Hives   Fosamax [Alendronate Sodium] Other (See Comments)    Reflux increase   Penicillins Hives   Shellfish Allergy    Iodine Rash     PMHx:   Past Medical History:  Diagnosis Date   Anemia    Asthma    GERD (gastroesophageal reflux disease)    Goiter    Hyperlipidemia    Hypertension    MVP (mitral valve prolapse)    Osteopenia    Other abnormal glucose    Positive colorectal cancer screening using Cologuard test 08/11/2017      Immunization History  Administered Date(s) Administered   Influenza, High Dose Seasonal PF 07/14/2014, 05/21/2016, 06/20/2017, 05/19/2019, 06/06/2020, 06/05/2021, 06/12/2022   Influenza-Unspecified 06/28/2013, 07/06/2015, 06/09/2018   Moderna Covid-19 Vaccine Bivalent Booster 56yrs & up 06/05/2021   Moderna SARS-COV2 Booster Vaccination 10/19/2019, 07/12/2020, 07/12/2020, 06/05/2021   Moderna Sars-Covid-2 Vacc 09/30/2019, 10/19/2019   PNEUMOCOCCAL -20 08/30/2021   Pfizer Covid-19 Bivalent Booster  06/12/2022   Pneumococcal - 13  02/01/2014   Pneumococcal - 23 09/10/2007   Tdap 12/05/2009, 08/30/2020   Zoster, Live 09/09/2005     Past Surgical History:  Procedure Laterality Date   APPENDECTOMY     CHOLECYSTECTOMY     EYE SURGERY     TONSILLECTOMY AND ADENOIDECTOMY       FHx:    Reviewed / unchanged   SHx:    Reviewed / unchanged    Systems Review:  Constitutional: Denies fever, chills, wt changes, headaches, insomnia, fatigue, night sweats, change in appetite. Eyes: Denies redness, blurred vision, diplopia, discharge, itchy, watery eyes.  ENT: Denies discharge, congestion, post nasal drip, epistaxis, sore throat, earache, hearing loss, dental pain, tinnitus, vertigo, sinus pain, snoring.  CV: Denies chest pain, palpitations, irregular heartbeat, syncope, dyspnea, diaphoresis, orthopnea, PND, claudication or edema. Respiratory: denies cough, dyspnea, DOE, pleurisy, hoarseness, laryngitis, wheezing.  Gastrointestinal: Denies dysphagia, odynophagia, heartburn, reflux, water brash, abdominal pain or cramps, nausea, vomiting, bloating, diarrhea, constipation, hematemesis, melena, hematochezia  or hemorrhoids. Genitourinary: Denies dysuria, frequency, urgency, nocturia, hesitancy, discharge, hematuria or flank pain. Musculoskeletal: Denies arthralgias, myalgias, stiffness, jt. swelling, pain, limping or strain/sprain.  Skin: Denies pruritus, rash, hives, warts, acne, eczema or change in skin lesion(s). Neuro: No weakness, tremor, incoordination, spasms, paresthesia or pain. Psychiatric: Denies confusion, memory loss or sensory loss. Endo: Denies change in weight, skin or hair change.  Heme/Lymph: No excessive bleeding, bruising or enlarged lymph nodes.   Physical Exam  BP 120/78   Pulse 74   Temp 97.9 F (36.6 C)   Resp 16   Ht 5' 3.5" (1.613 m)   Wt 147 lb 6.4 oz (66.9 kg)   SpO2 99%   BMI 25.70 kg/m   Appears  well nourished, well groomed  and in no distress.  Eyes: PERRLA, EOMs, conjunctiva  no swelling or erythema. Sinuses: No frontal/maxillary tenderness ENT/Mouth: EAC's clear, TM's nl w/o erythema, bulging. Nares clear w/o erythema, swelling, exudates. Oropharynx clear without erythema or exudates. Oral hygiene is good. Tongue normal, non obstructing. Hearing intact.  Neck: Supple. Thyroid not palpable. Car 2+/2+ without bruits, nodes or JVD.  1. Essential hypertension F2Chest: Respirations nl with BS clear & equal w/o rales, rhonchi, wheezing or stridor.  Cor: Heart sounds normal w/ regular rate and rhythm without sig. murmurs, gallops, clicks or rubs. Peripheral pulses normal and equal  without edema.  Abdomen: Soft & bowel sounds normal. Non-tender w/o guarding, rebound, hernias, masses or organomegaly.  Lymphatics: Unremarkable.  Musculoskeletal: Full ROM all  peripheral extremities, joint stability, 5/5 strength and normal gait.  Skin: Warm, dry without exposed rashes, lesions or ecchymosis apparent.  Neuro: Cranial nerves intact, reflexes equal bilaterally. Sensory-motor testing grossly intact. Tendon reflexes grossly intact.  Pysch: Alert & oriented x 3.  Insight and judgement nl & appropriate. No ideations.   Assessment and Plan:  - Continue medication, monitor blood pressure at home.  - Continue DASH diet.  Reminder to go to the ER if any CP,  SOB, nausea, dizziness, severe HA, changes vision/speech.   - CBC with Differential/Platelet - COMPLETE METABOLIC PANEL WITH GFR - Magnesium - TSH  2. Hyperlipidemia, mixed  - Continue diet/meds, exercise,& lifestyle modifications.  - Continue monitor periodic cholesterol/liver & renal functions     - Lipid panel - TSH  3. Abnormal glucose  - Continue diet, exercise  - Lifestyle modifications.  - Monitor appropriate labs    - Hemoglobin A1c - Insulin, random  4. Vitamin D deficiency  - Continue supplementation   - VITAMIN D 25 Hydroxy   5. Hypothyroidism,  - TSH  6. Medication management  - CBC  with Differential/Platelet - COMPLETE METABOLIC PANEL WITH GFR - Magnesium - Lipid panel - TSH - Hemoglobin A1c - Insulin, random - VITAMIN D 25 Hydroxy        Discussed  regular exercise, BP monitoring, weight control to achieve/maintain BMI less than 25 and discussed med and SE's. Recommended labs to assess /monitor clinical status .  I discussed the assessment and treatment plan with the patient. The patient was provided an opportunity to ask questions and all were answered. The patient agreed with the plan and demonstrated an understanding of the instructions.  I provided over 30 minutes of exam, counseling, chart review and  complex critical decision making.        The patient was advised to call back or seek an in-person evaluation if the symptoms worsen or if the condition fails to improve as anticipated.   Kirtland Bouchard, MD

## 2022-12-01 NOTE — Patient Instructions (Signed)

## 2022-12-02 ENCOUNTER — Ambulatory Visit (INDEPENDENT_AMBULATORY_CARE_PROVIDER_SITE_OTHER): Payer: Medicare Other | Admitting: Internal Medicine

## 2022-12-02 ENCOUNTER — Encounter: Payer: Self-pay | Admitting: Internal Medicine

## 2022-12-02 VITALS — BP 120/78 | HR 74 | Temp 97.9°F | Resp 16 | Ht 63.5 in | Wt 147.4 lb

## 2022-12-02 DIAGNOSIS — I10 Essential (primary) hypertension: Secondary | ICD-10-CM | POA: Diagnosis not present

## 2022-12-02 DIAGNOSIS — E039 Hypothyroidism, unspecified: Secondary | ICD-10-CM

## 2022-12-02 DIAGNOSIS — Z79899 Other long term (current) drug therapy: Secondary | ICD-10-CM

## 2022-12-02 DIAGNOSIS — E782 Mixed hyperlipidemia: Secondary | ICD-10-CM

## 2022-12-02 DIAGNOSIS — R7309 Other abnormal glucose: Secondary | ICD-10-CM | POA: Diagnosis not present

## 2022-12-02 DIAGNOSIS — E559 Vitamin D deficiency, unspecified: Secondary | ICD-10-CM

## 2022-12-03 LAB — COMPLETE METABOLIC PANEL WITH GFR
AG Ratio: 1.8 (calc) (ref 1.0–2.5)
ALT: 20 U/L (ref 6–29)
AST: 20 U/L (ref 10–35)
Albumin: 4.5 g/dL (ref 3.6–5.1)
Alkaline phosphatase (APISO): 62 U/L (ref 37–153)
BUN: 19 mg/dL (ref 7–25)
CO2: 27 mmol/L (ref 20–32)
Calcium: 9.9 mg/dL (ref 8.6–10.4)
Chloride: 106 mmol/L (ref 98–110)
Creat: 0.72 mg/dL (ref 0.60–0.95)
Globulin: 2.5 g/dL (calc) (ref 1.9–3.7)
Glucose, Bld: 85 mg/dL (ref 65–99)
Potassium: 4.5 mmol/L (ref 3.5–5.3)
Sodium: 139 mmol/L (ref 135–146)
Total Bilirubin: 0.3 mg/dL (ref 0.2–1.2)
Total Protein: 7 g/dL (ref 6.1–8.1)
eGFR: 84 mL/min/{1.73_m2} (ref >=60)

## 2022-12-03 LAB — HEMOGLOBIN A1C
Hgb A1c MFr Bld: 5.5 % of total Hgb (ref <5.7)
Mean Plasma Glucose: 111 mg/dL
eAG (mmol/L): 6.2 mmol/L

## 2022-12-03 LAB — MAGNESIUM: Magnesium: 2.1 mg/dL (ref 1.5–2.5)

## 2022-12-03 LAB — CBC WITH DIFFERENTIAL/PLATELET
Absolute Monocytes: 687 cells/uL (ref 200–950)
Basophils Absolute: 47 cells/uL (ref 0–200)
Basophils Relative: 0.6 %
Eosinophils Absolute: 142 cells/uL (ref 15–500)
Eosinophils Relative: 1.8 %
HCT: 41.4 % (ref 35.0–45.0)
Hemoglobin: 13.7 g/dL (ref 11.7–15.5)
Lymphs Abs: 1778 cells/uL (ref 850–3900)
MCH: 30.9 pg (ref 27.0–33.0)
MCHC: 33.1 g/dL (ref 32.0–36.0)
MCV: 93.2 fL (ref 80.0–100.0)
MPV: 9.1 fL (ref 7.5–12.5)
Monocytes Relative: 8.7 %
Neutro Abs: 5246 cells/uL (ref 1500–7800)
Neutrophils Relative %: 66.4 %
Platelets: 270 10*3/uL (ref 140–400)
RBC: 4.44 10*6/uL (ref 3.80–5.10)
RDW: 11.9 % (ref 11.0–15.0)
Total Lymphocyte: 22.5 %
WBC: 7.9 10*3/uL (ref 3.8–10.8)

## 2022-12-03 LAB — LIPID PANEL
Cholesterol: 158 mg/dL (ref <200)
HDL: 58 mg/dL (ref >=50)
LDL Cholesterol (Calc): 72 mg/dL (calc)
Non-HDL Cholesterol (Calc): 100 mg/dL (calc) (ref <130)
Total CHOL/HDL Ratio: 2.7 (calc) (ref <5.0)
Triglycerides: 188 mg/dL — ABNORMAL HIGH (ref <150)

## 2022-12-03 LAB — INSULIN, RANDOM: Insulin: 28.3 u[IU]/mL — ABNORMAL HIGH

## 2022-12-03 LAB — VITAMIN D 25 HYDROXY (VIT D DEFICIENCY, FRACTURES): Vit D, 25-Hydroxy: 61 ng/mL (ref 30–100)

## 2022-12-03 LAB — TSH: TSH: 2.63 mIU/L (ref 0.40–4.50)

## 2022-12-04 NOTE — Progress Notes (Signed)
<><><><><><><><><><><><><><><><><><><><><><><><><><><><><><><><><> <><><><><><><><><><><><><><><><><><><><><><><><><><><><><><><><><> -   Test results slightly outside the reference range are not unusual. If there is anything important, I will review this with you,  otherwise it is considered normal test values.  If you have further questions,  please do not hesitate to contact me at the office or via My Chart.  <><><><><><><><><><><><><><><><><><><><><><><><><><><><><><><><><> <><><><><><><><><><><><><><><><><><><><><><><><><><><><><><><><><>  -  Chol = 158  &  LDL = 72  - Both  Excellent   - Very low risk for Heart Attack  / Stroke <><><><><><><><><><><><><><><><><><><><><><><><><><><><><><><><><> <><><><><><><><><><><><><><><><><><><><><><><><><><><><><><><><><>  - A1c - Normal - No Diabetes  - Great ! <><><><><><><><><><><><><><><><><><><><><><><><><><><><><><><><><> <><><><><><><><><><><><><><><><><><><><><><><><><><><><><><><><><>  - Vitamin D = 61 - Excellent - Please keep dosing same  <><><><><><><><><><><><><><><><><><><><><><><><><><><><><><><><><> <><><><><><><><><><><><><><><><><><><><><><><><><><><><><><><><><>  - All Else - CBC - Kidneys - Electrolytes - Liver - Magnesium & Thyroid    - all  Normal / OK <><><><><><><><><><><><><><><><><><><><><><><><><><><><><><><><><> <><><><><><><><><><><><><><><><><><><><><><><><><><><><><><><><><>   -  Keep up the [place] Work  ! <><><><><><><><><><><><><><><><><><><><><><><><><><><><><><><><><> <><><><><><><><><><><><><><><><><><><><><><><><><><><><><><><><><>

## 2022-12-30 ENCOUNTER — Ambulatory Visit (INDEPENDENT_AMBULATORY_CARE_PROVIDER_SITE_OTHER): Payer: Medicare Other | Admitting: Nurse Practitioner

## 2022-12-30 ENCOUNTER — Encounter: Payer: Self-pay | Admitting: Nurse Practitioner

## 2022-12-30 DIAGNOSIS — J45909 Unspecified asthma, uncomplicated: Secondary | ICD-10-CM | POA: Diagnosis not present

## 2022-12-30 DIAGNOSIS — U071 COVID-19: Secondary | ICD-10-CM | POA: Diagnosis not present

## 2022-12-30 DIAGNOSIS — R0602 Shortness of breath: Secondary | ICD-10-CM

## 2022-12-30 MED ORDER — PREDNISONE 10 MG PO TABS: ORAL_TABLET | ORAL | 0 refills | Status: DC

## 2022-12-30 MED ORDER — TRELEGY ELLIPTA 100-62.5-25 MCG/ACT IN AEPB: 100.0000 ug | INHALATION_SPRAY | Freq: Every day | RESPIRATORY_TRACT | 6 refills | Status: DC

## 2022-12-30 NOTE — Patient Instructions (Signed)
COVID-19 COVID-19 is an infection caused by a virus called SARS-CoV-2. Most people who get COVID-19 have mild to moderate symptoms. Some have little to no symptoms. In others, the virus may cause a severe infection. What are the causes? COVID-19 is caused by a coronavirus. The virus may be in the air as droplets or as tiny specks of fluid (aerosols). It may also be on surfaces. You may catch the virus if you: Breathe in droplets when a person with COVID-19 breathes, speaks, sings, coughs, or sneezes. Touch something that has the virus on it and then touch your mouth, nose, or eyes. What increases the risk? Risk for infection: You are more likely to get COVID-19 if: You are within 6 ft (1.8 m) of a person who has COVID-19 for 15 minutes or longer. You provide care to a person who has COVID-19. You are in close contact with others. This includes hugging, kissing, or sharing utensils. Risk for serious illness caused by COVID-19: You are more likely to get very ill from COVID-19 if: You have cancer. You have a long-term (chronic) disease. This may be: A chronic lung disease, such as pulmonary embolism, chronic obstructive pulmonary disease (COPD), or cystic fibrosis. A disease that affects your body's defense system (immune system). If you have a weak immune system, you are said to be immunocompromised. A serious heart condition, such as heart failure, coronary artery disease, or cardiomyopathy. Diabetes. Chronic kidney disease. A liver disease, such as cirrhosis, nonalcoholic fatty liver disease, alcoholic liver disease, or autoimmune hepatitis. You are obese. You are pregnant or were just pregnant. You have sickle cell disease. What are the signs or symptoms? Symptoms of COVID-19 can range from mild to severe. They may appear any time from 2 to 14 days after you are exposed. They include: Fever or chills. Shortness of breath or trouble breathing. Feeling tired. Headaches, body aches, or  muscle aches. A runny or stuffy nose. Sneezing, coughing, or a sore throat. New loss of taste or smell. You may also have stomach problems, such as nausea, vomiting, or diarrhea. In some cases, you may not have any symptoms. How is this diagnosed? COVID-19 may be diagnosed by testing a sample to check for the virus. The most common tests are the PCR test and the antigen test. Tests may be done in the lab or at home. They include: Using a swab to take a sample of fluid from your nose. Testing a sample of saliva from your mouth. Testing a sample of mucus from your lungs (sputum). How is this treated? Treatment for COVID-19 depends on how severe your condition is. Mild symptoms can be treated at home. You should rest, drink fluids, and take over-the-counter medicine. If you have symptoms and risk factors, you may be prescribed a medicine that fights viruses (antiviral). Severe symptoms may be treated in a hospital intensive care unit (ICU). Treatment may include: Extra oxygen given through a tube in the nose, a face mask, or a hood. Medicines. These may include: Antivirals, such as remdesivir. Anti-inflammatories, such as corticosteroids. These help reduce inflammation. Antithrombotics. These help prevent or treat blood clots. Convalescent plasma. This helps boost your immune system. Prone positioning. This is when you are laid on your stomach to help oxygen get into your lungs. Infection control measures. If you are at risk for a more serious illness, your health care provider may prescribe two medicines to help your immune system protect you. These are called long-acting monoclonal antibodies. They are given together   every 6 months. How is this prevented? To protect yourself: Get the vaccine or vaccine series if you meet the guidelines. You can even get the vaccine while you are pregnant or making breast milk (lactating). Get an added dose of the vaccine if you are immunocompromised. This  applies if you have had an organ transplant or if you have a condition that affects your immune system. You should get the added dose 4 weeks after you got the first one. If you get an mRNA vaccine, you will need to get 3 doses. Talk to your provider about getting experimental monoclonal antibodies. This treatment can help prevent severe illness. It may be given to you if: You are immunocompromised. You cannot get the vaccine. You may not get the vaccine if you have a severe allergic reaction to it or to what it is made of. You are not fully vaccinated. You are in a place where there is COVID-19 and: You are in close contact with someone who has COVID-19. You are at high risk of being exposed. You are at risk of illness from new variants of the virus. To protect others: If you have symptoms of COVID-19, take steps to stop the virus from spreading. Stay home. Leave your house only to get medical care. Do not use public transit. Do not travel while you are sick. Wash your hands often with soap and water for at least 20 seconds. If soap and water are not available, use alcohol-based hand sanitizer. Make sure that all people in your household wash their hands well and often. Cough or sneeze into a tissue or your sleeve or elbow. Do not cough or sneeze into your hand or into the air. Where to find more information Centers for Disease Control and Prevention (CDC): cdc.gov World Health Organization (WHO): who.int Get help right away if: You have trouble breathing. You have pain or pressure in your chest. You are confused. Your lips or fingernails turn blue. You have trouble waking from sleep. Your symptoms get worse. These symptoms may be an emergency. Get help right away. Call 911. Do not wait to see if the symptoms will go away. Do not drive yourself to the hospital. This information is not intended to replace advice given to you by your health care provider. Make sure you discuss any  questions you have with your health care provider. Document Revised: 05/10/2022 Document Reviewed: 05/10/2022 Elsevier Patient Education  2023 [employer] Inc[employer]  

## 2022-12-30 NOTE — Progress Notes (Signed)
THIS ENCOUNTER IS A VIRTUAL VISIT DUE TO COVID-19 - PATIENT WAS NOT SEEN IN THE OFFICE.  PATIENT HAS CONSENTED TO VIRTUAL VISIT / TELEMEDICINE VISIT   Virtual Visit via telephone Note  I connected with  Isabel Oliver on 12/30/2022 by telephone.  I verified that I am speaking with the correct person using two identifiers.    I discussed the limitations of evaluation and management by telemedicine and the availability of in person appointments. The patient expressed understanding and agreed to proceed.  History of Present Illness:  There were no vitals taken for this visit. 82 y.o. patient contacted office reporting URI sx cough, sore throat, runny. she tested positive by home test last night. OV was conducted by telephone to minimize exposure. This patient has been vaccinated for covid 19, last 06/12/2022  Sx began 1 day days ago with cough, sore throat, runny nose.   Treatments tried so far: Tylenol  Exposures: Unknown   Medications  Current Outpatient Medications (Endocrine & Metabolic):    levothyroxine (SYNTHROID) 75 MCG tablet, Take  1 tablet  Daily  on an empty stomach with only water for 30 minutes & no Antacid meds, Calcium or Magnesium for 4 hours & avoid Biotin                                                                         /                                                                                  TAKE                                BY                                        MOUTH  Current Outpatient Medications (Cardiovascular):    rosuvastatin (CRESTOR) 20 MG tablet, TAKE 1 TABLET BY MOUTH DAILY FOR CHOLESTEROL   verapamil (CALAN-SR) 120 MG CR tablet, TAKE 1 TABLET BY MOUTH AT BEDTIME FOR BLOOD PRESSURE  Current Outpatient Medications (Respiratory):    albuterol (VENTOLIN HFA) 108 (90 Base) MCG/ACT inhaler, Use  2 Inhalations  15 minutes  Apart  every 4 hours  as need to Rescue Asthma Attack   cetirizine (ZYRTEC) 10 MG tablet, Take 10 mg by mouth  daily.   montelukast (SINGULAIR) 10 MG tablet, TAKE 1 TABLET BY MOUTH DAILY FOR ALLERGIES  Current Outpatient Medications (Analgesics):    aspirin 81 MG chewable tablet, Chew 81 mg by mouth daily.  Current Outpatient Medications (Hematological):    cyanocobalamin 500 MCG tablet, Take 500 mcg by mouth daily.  Current Outpatient Medications (Other):    Ascorbic Acid (VITAMIN C) 500 MG CAPS, Take 1 capsule by mouth daily.  Calcium Carb-Cholecalciferol 1000-800 MG-UNIT TABS, Take by mouth.   Cholecalciferol (VITAMIN D) 2000 UNITS CAPS, Take by mouth.   diclofenac sodium (VOLTAREN) 1 % GEL, APPLY 2 GRAMS TO AFFECTED AREA FOUR TIMES DAILY   Magnesium 500 MG TABS, Take 1 tablet by mouth daily.   Multiple Vitamins-Minerals (MULTIVITAMIN PO), Take by mouth daily.   Omega-3 Fatty Acids (FISH OIL PO), Take by mouth daily.   omeprazole (PRILOSEC) 20 MG capsule, TAKE ONE CAPSULE BY MOUTH DAILY TO PREVENT INDIGESTION AND HEARTBURN   Probiotic Product (PROBIOTIC DAILY PO), Take by mouth daily.   zinc gluconate 50 MG tablet, Take 50 mg by mouth daily.  Allergies:  Allergies  Allergen Reactions   Adhesive [Tape] Hives   Codeine Hives   Fosamax [Alendronate Sodium] Other (See Comments)    Reflux increase   Penicillins Hives   Shellfish Allergy    Iodine Rash    Problem list She has Other abnormal glucose; MVP (mitral valve prolapse); Hypertension; Hyperlipidemia; GERD (gastroesophageal reflux disease); Asthma; Medication management; Vitamin D deficiency; Encounter for Medicare annual wellness exam; and Hyperthyroidism on their problem list.   Social History:   reports that she has never smoked. She has never used smokeless tobacco. She reports that she does not drink alcohol and does not use drugs.  Observations/Objective:  General : Well sounding patient in no apparent distress HEENT: no hoarseness, no cough for duration of visit Lungs: speaks in complete sentences, no audible wheezing, no  apparent distress Neurological: alert, oriented x 3 Psychiatric: pleasant, judgement appropriate   Assessment and Plan:  Covid 19 Covid 19 positive per rapid screening test in home Risk factors include:  has Other abnormal glucose; MVP (mitral valve prolapse); Hypertension; Hyperlipidemia; GERD (gastroesophageal reflux disease); Asthma; Medication management; Vitamin D deficiency; Encounter for Medicare annual wellness exam; and Hyperthyroidism on their problem list.       Symptoms are: mild Due to co morbid conditions and risk factors, discussed antivirals Plaxlovid Immue support reviewed Vitamin C, Vitamin D, Zinc Take tylenol PRN temp 101+ Push hydration Regular ambulation or calf exercises exercises for clot prevention and 81 mg ASA unless contraindicated Sx supportive therapy suggested Follow up via mychart or telephone if needed Advised patient obtain O2 monitor; present to ED if persistently <90% or with severe dyspnea, CP, fever uncontrolled by tylenol, confusion, sudden decline  Wear mask in public.  COVID-19  - predniSONE (DELTASONE) 10 MG tablet; 1 tab 3 x day for 2 days, then 1 tab 2 x day for 2 days, then 1 tab 1 x day for 3 days  Dispense: 13 tablet; Refill: 0  Shortness of breath  - Fluticasone-Umeclidin-Vilant (TRELEGY ELLIPTA) 100-62.5-25 MCG/ACT AEPB; Inhale 100 mcg into the lungs daily.  Dispense: 60 each; Refill: 6 - predniSONE (DELTASONE) 10 MG tablet; 1 tab 3 x day for 2 days, then 1 tab 2 x day for 2 days, then 1 tab 1 x day for 3 days  Dispense: 13 tablet; Refill: 0  Uncomplicated asthma, unspecified asthma severity, unspecified whether persistent Continue Albuterol as directed Continue to monitor O2 with pulse ox. Call 911 for any readings <90%.  Follow Up Instructions:  I discussed the assessment and treatment plan with the patient. The patient was provided an opportunity to ask questions and all were answered. The patient agreed with the plan and  demonstrated an understanding of the instructions.   The patient was advised to call back or seek an in-person evaluation if the symptoms worsen or if  the condition fails to improve as anticipated.  I provided 15 minutes of non-face-to-face time during this encounter.   Adela Glimpse, NP

## 2023-01-06 DIAGNOSIS — R000 Tachycardia, unspecified: Secondary | ICD-10-CM | POA: Diagnosis not present

## 2023-01-06 DIAGNOSIS — I10 Essential (primary) hypertension: Secondary | ICD-10-CM | POA: Diagnosis not present

## 2023-01-06 DIAGNOSIS — Z7951 Long term (current) use of inhaled steroids: Secondary | ICD-10-CM | POA: Diagnosis not present

## 2023-01-06 DIAGNOSIS — Z885 Allergy status to narcotic agent status: Secondary | ICD-10-CM | POA: Diagnosis not present

## 2023-01-06 DIAGNOSIS — R7989 Other specified abnormal findings of blood chemistry: Secondary | ICD-10-CM | POA: Diagnosis not present

## 2023-01-06 DIAGNOSIS — Z79899 Other long term (current) drug therapy: Secondary | ICD-10-CM | POA: Diagnosis not present

## 2023-01-06 DIAGNOSIS — J45909 Unspecified asthma, uncomplicated: Secondary | ICD-10-CM | POA: Diagnosis not present

## 2023-01-06 DIAGNOSIS — R531 Weakness: Secondary | ICD-10-CM | POA: Diagnosis not present

## 2023-01-06 DIAGNOSIS — U071 COVID-19: Secondary | ICD-10-CM | POA: Diagnosis not present

## 2023-01-06 DIAGNOSIS — Z79890 Hormone replacement therapy (postmenopausal): Secondary | ICD-10-CM | POA: Diagnosis not present

## 2023-01-06 DIAGNOSIS — R079 Chest pain, unspecified: Secondary | ICD-10-CM | POA: Diagnosis not present

## 2023-01-07 DIAGNOSIS — J45909 Unspecified asthma, uncomplicated: Secondary | ICD-10-CM | POA: Diagnosis not present

## 2023-01-07 DIAGNOSIS — I080 Rheumatic disorders of both mitral and aortic valves: Secondary | ICD-10-CM | POA: Diagnosis not present

## 2023-01-07 DIAGNOSIS — R7989 Other specified abnormal findings of blood chemistry: Secondary | ICD-10-CM | POA: Diagnosis not present

## 2023-01-07 DIAGNOSIS — U071 COVID-19: Secondary | ICD-10-CM | POA: Diagnosis not present

## 2023-01-07 DIAGNOSIS — I10 Essential (primary) hypertension: Secondary | ICD-10-CM | POA: Diagnosis not present

## 2023-01-09 NOTE — Progress Notes (Signed)
ER follow up  Assessment and Plan: ER visit follow up for:   Isabel Oliver was seen today for hospitalization follow-up.  Diagnoses and all orders for this visit:  COVID-19?weakness Improving Fatigue persists- continue vitamin supplementation and increase activity as tolerated  Essential hypertension - continue medications, DASH diet, exercise and monitor at home. Call if greater than 130/80.    Hypothyroidism, unspecified type Please take your thyroid medication greater than 30 min before breakfast, separated by at least 4 hours  from antacids, calcium, iron, and multivitamins.  Medication management Continued  Diastolic dysfunction Referred to cardiology for further evaluation and to be set up with 30 day monitor Go to the ER if any chest pain, shortness of breath, nausea, dizziness, severe HA, changes vision/speech  -     Ambulatory referral to Cardiology      All medications were reviewed with patient and family and fully reconciled. All questions answered fully, and patient and family members were encouraged to call the office with any further questions or concerns. Discussed goal to avoid readmission related to this diagnosis.  There are no discontinued medications.   Over 40 minutes of exam, counseling, chart review, and complex, high/moderate level critical decision making was performed this visit.   Future Appointments  Date Time Provider Department Center  01/13/2023 10:00 AM Raynelle Dick, NP [facility] None  02/21/2023  8:00 AM [facility] DX DEXA 1 [facility]  04/17/2023 11:00 AM Lucky Cowboy, MD [facility] None  09/04/2023 11:00 AM Adela Glimpse, NP [facility] None     HPI 82 y.o.female presents for follow up for transition from ER visit for COvid  In ED, vital signs stable. CMP and CBC unremarkable. Initial troponin T was 13 with 1 hr repeat of 20 (delta 7). 3 hour repeat was 30 (delta 3 hr was 17). CXR showed no acute cardiopulmonary issues. ECG  with NSR. Pt was admitted to med/tele floor and treated for the following:  # COVID-19 virus infection-continue supportive care. Pt is not hypoxic so does not qualify for steroids.  # Elevated troponin I level-pt was monitored on telemetry for 24 hrs with no events noted. Pt denied chest pain or shortness of breath. ECG with no ST elevation. Likely secondary to tachycardia versus covid infection. Echo was performed and showed EF of 55-60%, mild diastolic dysfunction, and no wall motion abnormalities. NM lung scan was low probability for PE. Will refer patient to cardiology for 30 day cardiac event monitor.  # Hypertension-continue verapamil  # Asthma-continue prn nebs  # Weakness-likely secondary to COVID  On day of discharge, pt was hemodynamically stable. She can f/u with PCP in 1 week  Post Hospital Care Activity as tolerated 30 Day Cardiac Event Monitor-Hookup Recording Referral Type: Cardiology Discharge instructions -f/u with cardiology for 30 day event monitor -f/u with PCP in 1 week. Follow-up with Primary Care Physician F/u with PCP in 1 week. Reason for referral: post-hospital discharge Referral Type: Consultation Evaluate and Return Contact information for follow-up Follow-up with Primary Care Physician Next Steps: Follow up Instructions: post-hospital discharge   She is feeling better, still has some fatigue.  Denies shortness of breath, cough, fever, loss of smell/taste.  Her appetite has also improved.   BP well controlled with verapamil 120 mg QD.  Denies headaches, chest pain, shortness of breath and dizziness BP Readings from Last 3 Encounters:  01/13/23 128/62  12/02/22 120/78  09/03/22 132/80   BMI is Body mass index is 25.74 kg/m., she has not been  working on diet and exercise. Wt Readings from Last 3 Encounters:  01/13/23 147 lb 9.6 oz (67 kg)  12/02/22 147 lb 6.4 oz (66.9 kg)  09/03/22 144 lb (65.3 kg)      Images while in the hospital: MM 3D  SCREEN BREAST BILATERAL  Result Date: 04/25/2022 CLINICAL DATA:  Screening. EXAM: DIGITAL SCREENING BILATERAL MAMMOGRAM WITH TOMOSYNTHESIS AND CAD TECHNIQUE: Bilateral screening digital craniocaudal and mediolateral oblique mammograms were obtained. Bilateral screening digital breast tomosynthesis was performed. The images were evaluated with computer-aided detection. COMPARISON:  Previous exam(s). ACR Breast Density Category b: There are scattered areas of fibroglandular density. FINDINGS: There are no findings suspicious for malignancy. IMPRESSION: No mammographic evidence of malignancy. A result letter of this screening mammogram will be mailed directly to the patient. RECOMMENDATION: Screening mammogram in one year. (Code:SM-B-01Y) BI-RADS CATEGORY  1: Negative. Electronically Signed   By: Norva Pavlov M.D.   On: 04/25/2022 13:07     Current Outpatient Medications (Endocrine & Metabolic):    levothyroxine (SYNTHROID) 75 MCG tablet, Take  1 tablet  Daily  on an empty stomach with only water for 30 minutes & no Antacid meds, Calcium or Magnesium for 4 hours & avoid Biotin                                                                         /                                                                                  TAKE                                BY                                        MOUTH   predniSONE (DELTASONE) 10 MG tablet, 1 tab 3 x day for 2 days, then 1 tab 2 x day for 2 days, then 1 tab 1 x day for 3 days  Current Outpatient Medications (Cardiovascular):    rosuvastatin (CRESTOR) 20 MG tablet, TAKE 1 TABLET BY MOUTH DAILY FOR CHOLESTEROL   verapamil (CALAN-SR) 120 MG CR tablet, TAKE 1 TABLET BY MOUTH AT BEDTIME FOR BLOOD PRESSURE  Current Outpatient Medications (Respiratory):    albuterol (VENTOLIN HFA) 108 (90 Base) MCG/ACT inhaler, Use  2 Inhalations  15 minutes  Apart  every 4 hours  as need to Rescue Asthma Attack   cetirizine (ZYRTEC) 10 MG tablet, Take 10 mg by  mouth daily.   Fluticasone-Umeclidin-Vilant (TRELEGY ELLIPTA) 100-62.5-25 MCG/ACT AEPB, Inhale 100 mcg into the lungs daily.   montelukast (SINGULAIR) 10 MG tablet, TAKE 1 TABLET BY MOUTH DAILY FOR ALLERGIES  Current Outpatient Medications (Analgesics):    aspirin 81 MG chewable  tablet, Chew 81 mg by mouth daily.  Current Outpatient Medications (Hematological):    cyanocobalamin 500 MCG tablet, Take 500 mcg by mouth daily.  Current Outpatient Medications (Other):    Ascorbic Acid (VITAMIN C) 500 MG CAPS, Take 1 capsule by mouth daily.   Calcium Carb-Cholecalciferol 1000-800 MG-UNIT TABS, Take by mouth.   Cholecalciferol (VITAMIN D) 2000 UNITS CAPS, Take by mouth.   diclofenac sodium (VOLTAREN) 1 % GEL, APPLY 2 GRAMS TO AFFECTED AREA FOUR TIMES DAILY   Magnesium 500 MG TABS, Take 1 tablet by mouth daily.   Multiple Vitamins-Minerals (MULTIVITAMIN PO), Take by mouth daily.   Omega-3 Fatty Acids (FISH OIL PO), Take by mouth daily.   omeprazole (PRILOSEC) 20 MG capsule, TAKE ONE CAPSULE BY MOUTH DAILY TO PREVENT INDIGESTION AND HEARTBURN   Probiotic Product (PROBIOTIC DAILY PO), Take by mouth daily.   zinc gluconate 50 MG tablet, Take 50 mg by mouth daily.  Past Medical History:  Diagnosis Date   Anemia    Asthma    GERD (gastroesophageal reflux disease)    Goiter    Hyperlipidemia    Hypertension    MVP (mitral valve prolapse)    Osteopenia    Other abnormal glucose    Positive colorectal cancer screening using Cologuard test 08/11/2017     Allergies  Allergen Reactions   Adhesive [Tape] Hives   Codeine Hives   Fosamax [Alendronate Sodium] Other (See Comments)    Reflux increase   Penicillins Hives   Shellfish Allergy    Iodine Rash    ROS: all negative except above.   Physical Exam: There were no vitals filed for this visit. There were no vitals taken for this visit. General Appearance: Well nourished, in no apparent distress. Eyes: PERRLA, EOMs, conjunctiva no  swelling or erythema Sinuses: No Frontal/maxillary tenderness ENT/Mouth: Ext aud canals clear, TMs without erythema, bulging. No erythema, swelling, or exudate on post pharynx.  Tonsils not swollen or erythematous. Hearing normal.  Neck: Supple, thyroid normal.  Respiratory: Respiratory effort normal, BS equal bilaterally without rales, rhonchi, wheezing or stridor.  Cardio: RRR with no MRGs. Brisk peripheral pulses without edema.  Abdomen: Soft, + BS.  Non tender, no guarding, rebound, hernias, masses. Lymphatics: Non tender without lymphadenopathy.  Musculoskeletal: Full ROM, 5/5 strength, normal gait.  Skin: Warm, dry without rashes, lesions, ecchymosis.  Neuro: Cranial nerves intact. Normal muscle tone, no cerebellar symptoms. Sensation intact.  Psych: Awake and oriented X 3, normal affect, Insight and Judgment appropriate.     Raynelle Dick, NP 1:42 PM [facility]

## 2023-01-13 ENCOUNTER — Ambulatory Visit (INDEPENDENT_AMBULATORY_CARE_PROVIDER_SITE_OTHER): Payer: Medicare Other | Admitting: Nurse Practitioner

## 2023-01-13 ENCOUNTER — Encounter: Payer: Self-pay | Admitting: Nurse Practitioner

## 2023-01-13 VITALS — BP 128/62 | HR 73 | Temp 97.7°F | Ht 63.5 in | Wt 147.6 lb

## 2023-01-13 DIAGNOSIS — R531 Weakness: Secondary | ICD-10-CM

## 2023-01-13 DIAGNOSIS — I5189 Other ill-defined heart diseases: Secondary | ICD-10-CM

## 2023-01-13 DIAGNOSIS — U071 COVID-19: Secondary | ICD-10-CM | POA: Diagnosis not present

## 2023-01-13 DIAGNOSIS — Z79899 Other long term (current) drug therapy: Secondary | ICD-10-CM

## 2023-01-13 DIAGNOSIS — I10 Essential (primary) hypertension: Secondary | ICD-10-CM

## 2023-01-13 DIAGNOSIS — J45909 Unspecified asthma, uncomplicated: Secondary | ICD-10-CM

## 2023-01-13 DIAGNOSIS — E559 Vitamin D deficiency, unspecified: Secondary | ICD-10-CM

## 2023-01-13 DIAGNOSIS — E039 Hypothyroidism, unspecified: Secondary | ICD-10-CM

## 2023-01-13 NOTE — Patient Instructions (Signed)
Fatigue If you have fatigue, you feel tired all the time and have a lack of energy or a lack of motivation. Fatigue may make it difficult to start or complete tasks because of exhaustion. Occasional or mild fatigue is often a normal response to activity or life. However, long-term (chronic) or extreme fatigue may be a symptom of a medical condition such as: Depression. Not having enough red blood cells or hemoglobin in the blood (anemia). A problem with a small gland located in the lower front part of the neck (thyroid disorder). Rheumatologic conditions. These are problems related to the body's defense system (immune system). Infections, especially certain viral infections. Fatigue can also lead to negative health outcomes over time. Follow these instructions at home: Medicines Take over-the-counter and prescription medicines only as told by your health care provider. Take a multivitamin if told by your health care provider. Do not use herbal or dietary supplements unless they are approved by your health care provider. Eating and drinking  Avoid heavy meals in the evening. Eat a well-balanced diet, which includes lean proteins, whole grains, plenty of fruits and vegetables, and low-fat dairy products. Avoid eating or drinking too many products with caffeine in them. Avoid alcohol. Drink enough fluid to keep your urine pale yellow. Activity  Exercise regularly, as told by your health care provider. Use or practice techniques to help you relax, such as yoga, tai chi, meditation, or massage therapy. Lifestyle Change situations that cause you stress. Try to keep your work and personal schedules in balance. Do not use recreational or illegal drugs. General instructions Monitor your fatigue for any changes. Go to bed and get up at the same time every day. Avoid fatigue by pacing yourself during the day and getting enough sleep at night. Maintain a healthy weight. Contact a health care  provider if: Your fatigue does not get better. You have a fever. You suddenly lose or gain weight. You have headaches. You have trouble falling asleep or sleeping through the night. You feel angry, guilty, anxious, or sad. You have swelling in your legs or another part of your body. Get help right away if: You feel confused, feel like you might faint, or faint. Your vision is blurry or you have a severe headache. You have severe pain in your abdomen, your back, or the area between your waist and hips (pelvis). You have chest pain, shortness of breath, or an irregular or fast heartbeat. You are unable to urinate, or you urinate less than normal. You have abnormal bleeding from the rectum, nose, lungs, nipples, or, if you are female, the vagina. You vomit blood. You have thoughts about hurting yourself or others. These symptoms may be an emergency. Get help right away. Call 911. Do not wait to see if the symptoms will go away. Do not drive yourself to the hospital. Get help right away if you feel like you may hurt yourself or others, or have thoughts about taking your own life. Go to your nearest emergency room or: Call 911. Call the National Suicide Prevention Lifeline at 1-800-273-8255 or 988. This is open 24 hours a day. Text the Crisis Text Line at 741741. Summary If you have fatigue, you feel tired all the time and have a lack of energy or a lack of motivation. Fatigue may make it difficult to start or complete tasks because of exhaustion. Long-term (chronic) or extreme fatigue may be a symptom of a medical condition. Exercise regularly, as told by your health care provider.   Change situations that cause you stress. Try to keep your work and personal schedules in balance. This information is not intended to replace advice given to you by your health care provider. Make sure you discuss any questions you have with your health care provider. Document Revised: 06/18/2021 Document  Reviewed: 06/18/2021 Elsevier Patient Education  2023 Elsevier Inc.  

## 2023-01-15 ENCOUNTER — Telehealth: Payer: Self-pay | Admitting: Nurse Practitioner

## 2023-01-15 NOTE — Telephone Encounter (Signed)
Pt was supposed to get a heart monitor ordered while in the hospital but they set up a cardiology referral for them to do it. The cardiologist can not see her until June. Can we order this for her?

## 2023-01-20 ENCOUNTER — Ambulatory Visit: Payer: Medicare Other | Attending: Nurse Practitioner

## 2023-01-20 ENCOUNTER — Other Ambulatory Visit: Payer: Self-pay | Admitting: Nurse Practitioner

## 2023-01-20 ENCOUNTER — Other Ambulatory Visit: Payer: Self-pay | Admitting: Internal Medicine

## 2023-01-20 DIAGNOSIS — R000 Tachycardia, unspecified: Secondary | ICD-10-CM

## 2023-01-20 DIAGNOSIS — R002 Palpitations: Secondary | ICD-10-CM

## 2023-01-20 DIAGNOSIS — I5189 Other ill-defined heart diseases: Secondary | ICD-10-CM

## 2023-01-20 NOTE — Progress Notes (Unsigned)
Enrolled for [employer] to mail a ZIO XT long term holter monitor to the patients address on file.   DOD to read. 

## 2023-01-20 NOTE — Telephone Encounter (Signed)
Called patient, advised per Anda Kraft, agreed to order 14 Day Holter Monitor, based on 01-06-23 [facility] ER visit and follow up with Anda Kraft office visit. Still waiting to hear back from Dr. Clayborne Dana office regarding the heart monitor he recommends prior to new patient visit.

## 2023-01-20 NOTE — Telephone Encounter (Signed)
Please call pt and advise will be evaluated at cardiology and they will order event monitor

## 2023-01-22 DIAGNOSIS — R000 Tachycardia, unspecified: Secondary | ICD-10-CM | POA: Diagnosis not present

## 2023-01-22 DIAGNOSIS — R002 Palpitations: Secondary | ICD-10-CM | POA: Diagnosis not present

## 2023-01-22 DIAGNOSIS — I5189 Other ill-defined heart diseases: Secondary | ICD-10-CM | POA: Diagnosis not present

## 2023-02-12 DIAGNOSIS — R002 Palpitations: Secondary | ICD-10-CM | POA: Diagnosis not present

## 2023-02-12 DIAGNOSIS — I5189 Other ill-defined heart diseases: Secondary | ICD-10-CM | POA: Diagnosis not present

## 2023-02-19 ENCOUNTER — Encounter: Payer: Self-pay | Admitting: Cardiovascular Disease

## 2023-02-19 ENCOUNTER — Ambulatory Visit: Payer: Medicare Other | Attending: Cardiovascular Disease | Admitting: Cardiovascular Disease

## 2023-02-19 VITALS — BP 136/80 | HR 80 | Ht 64.0 in | Wt 143.0 lb

## 2023-02-19 DIAGNOSIS — R002 Palpitations: Secondary | ICD-10-CM | POA: Diagnosis not present

## 2023-02-19 DIAGNOSIS — E782 Mixed hyperlipidemia: Secondary | ICD-10-CM | POA: Diagnosis not present

## 2023-02-19 DIAGNOSIS — I10 Essential (primary) hypertension: Secondary | ICD-10-CM | POA: Diagnosis not present

## 2023-02-19 NOTE — Patient Instructions (Signed)
Medication Instructions:  Your physician recommends that you continue on your current medications as directed. Please refer to the Current Medication list given to you today.  *If you need a refill on your cardiac medications before your next appointment, please call your pharmacy*   Testing/Procedures: Your physician has requested that you have a carotid duplex. This test is an ultrasound of the carotid arteries in your neck. It looks at blood flow through these arteries that supply the brain with blood. Allow one hour for this exam. There are no restrictions or special instructions. This will take place at [address], [address].    Follow-Up: At [facility], you and your health needs are our priority.  As part of our continuing mission to provide you with exceptional heart care, we have created designated Provider Care Teams.  These Care Teams include your primary Cardiologist (physician) and Advanced Practice Providers (APPs -  Physician Assistants and Nurse Practitioners) who all work together to provide you with the care you need, when you need it.  We recommend signing up for the patient portal called "MyChart".  Sign up information is provided on this After Visit Summary.  MyChart is used to connect with patients for Virtual Visits (Telemedicine).  Patients are able to view lab/test results, encounter notes, upcoming appointments, etc.  Non-urgent messages can be sent to your provider as well.   To learn more about what you can do with MyChart, go to [email or web address].    Your next appointment:   We will see you on an as needed basis.  Provider:   Nanetta Batty, MD

## 2023-02-19 NOTE — Assessment & Plan Note (Signed)
History of hyperlipidemia on statin therapy with lipid profile performed 12/02/2022 revealing total cholesterol of 58, LDL 72 and HDL 58.

## 2023-02-19 NOTE — Progress Notes (Signed)
02/19/2023 Isabel Oliver   [birth year]  [id number]  Primary Physician Lucky Cowboy, MD Primary Cardiologist: Runell Gess MD Nicholes Calamity, [place]  HPI:  Isabel Oliver is a 82 y.o. thin-appearing widowed Caucasian female mother of 2 children, grandmother of 2 grandchildren who was referred by Jordan Likes, NP for evaluation of tachypalpitations.  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  Risk factors include treated hypertension and hyperlipidemia.  Her father had bypass surgery at age 10.  She has never had a heart attack or stroke.  She denies chest pain or shortness of breath.  She has a history of "mitral valve prolapse although echo performed at [facility] did not show this.  She contracted COVID 12/30/2022 which she has recovered from.  After that she had some tachypalpitations probably SVT and was taken by EMS to [facility] where she was hospitalized.  Enzymes were apparently mildly elevated.  2D echo was normal and a subsequent Zio patch showed several short runs of SVT.   Current Meds  Medication Sig   acetaminophen (TYLENOL) 500 MG tablet Take by mouth.   albuterol (VENTOLIN HFA) 108 (90 Base) MCG/ACT inhaler Use  2 Inhalations  15 minutes  Apart  every 4 hours  as need to Rescue Asthma Attack   Ascorbic Acid (VITAMIN C) 500 MG CAPS Take 1 capsule by mouth daily.   aspirin 81 MG chewable tablet Chew 81 mg by mouth daily.   Calcium Carb-Cholecalciferol 1000-800 MG-UNIT TABS Take by mouth.   cetirizine (ZYRTEC) 10 MG tablet Take 10 mg by mouth daily.   Cholecalciferol (VITAMIN D) 2000 UNITS CAPS Take by mouth.   cyanocobalamin 500 MCG tablet Take 500 mcg by mouth daily.   diclofenac sodium (VOLTAREN) 1 % GEL APPLY 2 GRAMS TO AFFECTED AREA FOUR TIMES DAILY   Fluticasone-Umeclidin-Vilant (TRELEGY ELLIPTA) 100-62.5-25 MCG/ACT AEPB Inhale 100 mcg into the lungs daily.   levothyroxine (SYNTHROID) 75 MCG tablet Take  1  tablet  Daily  on an empty stomach with only water for 30 minutes & no Antacid meds, Calcium or Magnesium for 4 hours & avoid Biotin                                                                         /                                                                                  TAKE                                BY  MOUTH   Magnesium 500 MG TABS Take 1 tablet by mouth daily.   montelukast (SINGULAIR) 10 MG tablet TAKE 1 TABLET BY MOUTH DAILY FOR ALLERGIES   Multiple Vitamins-Minerals (MULTIVITAMIN PO) Take by mouth daily.   Omega-3 Fatty Acids (FISH OIL PO) Take by mouth daily.   omeprazole (PRILOSEC) 20 MG capsule TAKE ONE CAPSULE BY MOUTH DAILY TO PREVENT INDIGESTION AND HEARTBURN   Probiotic Product (PROBIOTIC DAILY PO) Take by mouth daily.   rosuvastatin (CRESTOR) 20 MG tablet TAKE 1 TABLET BY MOUTH DAILY FOR CHOLESTEROL   verapamil (CALAN-SR) 120 MG CR tablet TAKE 1 TABLET BY MOUTH AT BEDTIME FOR BLOOD PRESSURE   zinc gluconate 50 MG tablet Take 50 mg by mouth daily. Takes 100mg      Allergies  Allergen Reactions   Adhesive [Tape] Hives   Codeine Hives   Fosamax [Alendronate Sodium] Other (See Comments)    Reflux increase   Penicillins Hives   Shellfish Allergy    Iodine Rash    Social History   Socioeconomic History   Marital status: Widowed    Spouse name: Not on file   Number of children: Not on file   Years of education: Not on file   Highest education level: Not on file  Occupational History   Not on file  Tobacco Use   Smoking status: Never   Smokeless tobacco: Never  Substance and Sexual Activity   Alcohol use: No   Drug use: No   Sexual activity: Not on file  Other Topics Concern   Not on file  Social History Narrative   Not on file   Social Determinants of Health   Financial Resource Strain: Not on file  Food Insecurity: Not on file  Transportation Needs: Not on file  Physical Activity: Not on file   Stress: Not on file  Social Connections: Not on file  Intimate Partner Violence: Not on file     Review of Systems: General: negative for chills, fever, night sweats or weight changes.  Cardiovascular: negative for chest pain, dyspnea on exertion, edema, orthopnea, palpitations, paroxysmal nocturnal dyspnea or shortness of breath Dermatological: negative for rash Respiratory: negative for cough or wheezing Urologic: negative for hematuria Abdominal: negative for nausea, vomiting, diarrhea, bright red blood per rectum, melena, or hematemesis Neurologic: negative for visual changes, syncope, or dizziness All other systems reviewed and are otherwise negative except as noted above.    Blood pressure 136/80, pulse 80, height 5\' 4"  (1.626 m), weight 143 lb (64.9 kg), SpO2 94 %.  General appearance: alert and no distress Neck: no adenopathy, no carotid bruit, no JVD, supple, symmetrical, trachea midline, and thyroid not enlarged, symmetric, no tenderness/mass/nodules Lungs: clear to auscultation bilaterally Heart: regular rate and rhythm, S1, S2 normal, no murmur, click, rub or gallop Extremities: extremities normal, atraumatic, no cyanosis or edema Pulses: 2+ and symmetric Skin: Skin color, texture, turgor normal. No rashes or lesions Neurologic: Grossly normal  EKG sinus rhythm at 80 without ST or T wave changes.  I personally reviewed this EKG.  ASSESSMENT AND PLAN:   Hypertension History of essential hypertension blood pressure measured today at 136/80.  She is on verapamil.  Hyperlipidemia History of hyperlipidemia on statin therapy with lipid profile performed 12/02/2022 revealing total cholesterol of 58, LDL 72 and HDL 58.  Palpitations Patient had COVID 12/30/22.  She was seen at [facility] was hospitalized overnight.  2D echo was normal.  She did have event monitor that showed some short runs of SVT after  that but has had no palpitations since.     Runell Gess MD  FACP,FACC,FAHA, [facility] 02/19/2023 3:11 PM

## 2023-02-19 NOTE — Assessment & Plan Note (Signed)
History of essential hypertension blood pressure measured today at 136/80.  She is on verapamil.

## 2023-02-19 NOTE — Assessment & Plan Note (Signed)
Patient had COVID 12/30/22.  She was seen at [facility] was hospitalized overnight.  2D echo was normal.  She did have event monitor that showed some short runs of SVT after that but has had no palpitations since.

## 2023-02-21 ENCOUNTER — Ambulatory Visit
Admission: RE | Admit: 2023-02-21 | Discharge: 2023-02-21 | Disposition: A | Discharge status: Home / Self Care | Payer: Medicare Other | Source: Ambulatory Visit | Attending: Nurse Practitioner | Admitting: Nurse Practitioner

## 2023-02-21 DIAGNOSIS — Z79899 Other long term (current) drug therapy: Secondary | ICD-10-CM | POA: Diagnosis not present

## 2023-02-21 DIAGNOSIS — N951 Menopausal and female climacteric states: Secondary | ICD-10-CM | POA: Diagnosis not present

## 2023-02-21 DIAGNOSIS — M810 Age-related osteoporosis without current pathological fracture: Secondary | ICD-10-CM

## 2023-02-21 DIAGNOSIS — E039 Hypothyroidism, unspecified: Secondary | ICD-10-CM | POA: Diagnosis not present

## 2023-02-21 DIAGNOSIS — E2839 Other primary ovarian failure: Secondary | ICD-10-CM | POA: Diagnosis not present

## 2023-02-21 DIAGNOSIS — R29890 Loss of height: Secondary | ICD-10-CM | POA: Diagnosis not present

## 2023-03-07 ENCOUNTER — Ambulatory Visit (HOSPITAL_COMMUNITY)
Admission: RE | Admit: 2023-03-07 | Discharge: 2023-03-07 | Disposition: A | Discharge status: Home / Self Care | Payer: Medicare Other | Source: Ambulatory Visit | Attending: Cardiovascular Disease | Admitting: Cardiovascular Disease

## 2023-03-07 DIAGNOSIS — I10 Essential (primary) hypertension: Secondary | ICD-10-CM | POA: Insufficient documentation

## 2023-03-07 DIAGNOSIS — E782 Mixed hyperlipidemia: Secondary | ICD-10-CM | POA: Diagnosis not present

## 2023-04-01 ENCOUNTER — Other Ambulatory Visit: Payer: Self-pay | Admitting: Internal Medicine

## 2023-04-01 DIAGNOSIS — Z1231 Encounter for screening mammogram for malignant neoplasm of breast: Secondary | ICD-10-CM

## 2023-04-02 DIAGNOSIS — K080 Exfoliation of teeth due to systemic causes: Secondary | ICD-10-CM | POA: Diagnosis not present

## 2023-04-17 ENCOUNTER — Encounter: Payer: Self-pay | Admitting: Internal Medicine

## 2023-04-17 ENCOUNTER — Ambulatory Visit: Payer: Medicare Other | Admitting: Internal Medicine

## 2023-04-17 NOTE — Progress Notes (Signed)
R  E S  C  H  E D  U  L  E  D   Due to [facility] Screening/Preventative Visit & Comprehensive Evaluation &  Examination  Future Appointments  Date Time Provider Department  04/17/2023                        cpe 11:00 AM Lucky Cowboy, MD [facility]  09/04/2023                    wellness 11:00 AM Adela Glimpse, NP [facility]  04/29/2024                       cpe 11:00 AM Lucky Cowboy, MD [facility]         This very nice 82 y.o. WWF with  HTN, HLD, Hypothyroidism, Prediabetes  and Vitamin D Deficiency   presents for a Screening /Preventative Visit & comprehensive evaluation and management of multiple medical co-morbidities.  Patient's GERD is controlled on her meds.        HTN predates circa 1995. Her BP has been controlled at home and patient denies any cardiac symptoms as chest pain, palpitations, shortness of breath, dizziness or ankle swelling. Today's BP is at goal -                        .       Her hyperlipidemia is controlled with diet and Rosuvastatin. Patient denies myalgias or other medication SE's. Last lipids were at goal  except elevated Trig's :  Lab Results  Component Value Date   CHOL 158 12/02/2022   HDL 58 12/02/2022   LDLCALC 72 12/02/2022   TRIG 188 (H) 12/02/2022   CHOLHDL 2.7 12/02/2022         Patient has hx/o prediabetes predating (A1c 6.0% /2014) and patient denies reactive hypoglycemic symptoms, visual blurring, diabetic polys or paresthesias. 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         Finally, patient has history of Vitamin D Deficiency and last Vitamin D was at goal:  Lab Results  Component Value Date   VD25OH 61 12/02/2022       Current Outpatient Medications on File Prior to Visit  Medication Sig   albuterol VENTOLIN HFA  inhaler Use  2 Inhalations  every 4 hrs as needed to Rescue Asthma Attack   VITAMIN C 500 MG CAPS Take 1 capsule daily.   aspirin 81 MG tablet Take daily.   Calcium -Cholecalciferol 1000 mg - 800 u Take daily   cetirizine 10 MG tablet Take 1 daily.   VITAMIN D 2000 UNITS  Take daily   cyanocobalamin 500 MCG tablet Take 500 mcg  daily.   diclofenac 1 % GEL APPLY 2 GRAMS  FOUR TIMES DAILY   LOMOTIL  tablet Take 1 to 2 tablets every 4 hours if needed for Diarrhea   levothyroxine  100 MCG tablet Take  1 tablet  Daily     Magnesium 500 MG TABS Take 1 tablet  daily.   montelukast  10 MG tablet TAKE 1 TABLET  DAILY    Multiple Vitamins-Minerals Take daily.   Omega-3 FISH OIL  Take daily.   omeprazole 20 MG capsule Take  1 capsule  Daily     Probiotic  Take daily.   rosuvastatin  20 MG tablet Take  1 tablet  Daily  for Cholesterol   verapamil-SR 120 MG CR  Take 1 tablet at bedtime for blood pressure.   zinc 50 MG tablet Take daily.    Allergies  Allergen Reactions   Adhesive [Tape] Hives   Codeine Hives   Fosamax [Alendronate Sodium] Other (See Comments)    Reflux increase   Penicillins Hives   Shellfish Allergy    Iodine Rash     Past Medical History:  Diagnosis Date   Anemia    Asthma    GERD (gastroesophageal reflux disease)    Goiter    Hyperlipidemia    Hypertension    MVP (mitral valve prolapse)    Osteopenia    Other abnormal glucose    Positive colorectal cancer screening using  Cologuard test 08/11/2017     Health Maintenance  Topic Date Due   Hepatitis C Screening  Never done   Zoster Vaccines- Shingrix (1 of 2) Never done   COVID-19 Vaccine (3 - Mixed Product risk series) 11/16/2019   INFLUENZA VACCINE  04/09/2021   COLONOSCOPY (Pts 45-74yrs Insurance coverage will need to be confirmed)  03/31/2023   TETANUS/TDAP  08/30/2030   DEXA SCAN  Completed   PNA vac Low Risk Adult  Completed   HPV VACCINES  Aged Out    Immunization History  Administered Date(s) Administered   Influenza, High Dose\ 06/20/2017, 05/19/2019, 06/06/2020   Influenza 06/28/2013, 07/06/2015, 06/09/2018   Moderna Sars-Covid-2 Vacc 09/30/2019, 10/19/2019   Pneumococcal -13 02/01/2014   Pneumococcal-23 09/10/2007   Tdap 12/05/2009, 08/30/2020   Zoster, Live 09/09/2005    Last Colon - 09/30/2017 - Dr Neomia Glass recc 5 yr f/u due Jan 2024.   Last MGM -  04/25/2022  Last dexa BMD - 02/21/2023    Past Surgical History:  Procedure Laterality Date   APPENDECTOMY     CHOLECYSTECTOMY     EYE SURGERY  TONSILLECTOMY AND ADENOIDECTOMY       Family History  Problem Relation Age of Onset   Hypertension Mother    Diabetes Father    Heart disease Father    Hyperlipidemia Sister    Hyperlipidemia Brother    Breast cancer Neg Hx      Social History   Tobacco Use   Smoking status: Never   Smokeless tobacco: Never  Substance Use Topics   Alcohol use: No   Drug use: No     ROS  Constitutional: Denies fever, chills, weight loss/gain, headaches, insomnia,  night sweats, and change in appetite. Does c/o fatigue. Eyes: Denies redness, blurred vision, diplopia, discharge, itchy, watery eyes.  ENT: Denies discharge, congestion, post nasal drip, epistaxis, sore throat, earache, hearing loss, dental pain, Tinnitus, Vertigo, Sinus pain, snoring.  Cardio: Denies chest pain, palpitations, irregular heartbeat, syncope, dyspnea, diaphoresis, orthopnea, PND, claudication,  edema Respiratory: denies cough, dyspnea, DOE, pleurisy, hoarseness, laryngitis, wheezing.  Gastrointestinal: Denies dysphagia, heartburn, reflux, water brash, pain, cramps, nausea, vomiting, bloating, diarrhea, constipation, hematemesis, melena, hematochezia, jaundice, hemorrhoids Genitourinary: Denies dysuria, frequency, urgency, nocturia, hesitancy, discharge, hematuria, flank pain Breast: Breast lumps, nipple discharge, bleeding.  Musculoskeletal: Denies arthralgia, myalgia, stiffness, Jt. Swelling, pain, limp, and strain/sprain. Denies falls. Skin: Denies puritis, rash, hives, warts, acne, eczema, changing in skin lesion Neuro: No weakness, tremor, incoordination, spasms, paresthesia, pain Psychiatric: Denies confusion, memory loss, sensory loss. Denies Depression. Endocrine: Denies change in weight, skin, hair change, nocturia, and paresthesia, diabetic polys, visual blurring, hyper / hypo glycemic episodes.  Heme/Lymph: No excessive bleeding, bruising, enlarged lymph nodes.  Physical Exam  There were no vitals taken for this visit.  General Appearance: Well nourished, well groomed and in no apparent distress.  Eyes: PERRLA, EOMs, conjunctiva no swelling or erythema, normal fundi and vessels. Sinuses: No frontal/maxillary tenderness ENT/Mouth: EACs patent / TMs  nl. Nares clear without erythema, swelling, mucoid exudates. Oral hygiene is good. No erythema, swelling, or exudate. Tongue normal, non-obstructing. Tonsils not swollen or erythematous. Hearing normal.  Neck: Supple, thyroid not palpable. No bruits, nodes or JVD. Respiratory: Respiratory effort normal.  BS equal and clear bilateral without rales, rhonci, wheezing or stridor. Cardio: Heart sounds are normal with regular rate and rhythm and no murmurs, rubs or gallops. Peripheral pulses are normal and equal bilaterally without edema. No aortic or femoral bruits. Chest: symmetric with normal excursions and percussion. Breasts:  Deferred to upcoming MGM.  Abdomen: Flat, soft with bowel sounds active. Nontender, no guarding, rebound, hernias, masses, or organomegaly.  Lymphatics: Non tender without lymphadenopathy.  Musculoskeletal: Full ROM all peripheral extremities, joint stability, 5/5 strength, and normal gait. Skin: Warm and dry without rashes, lesions, cyanosis, clubbing or  ecchymosis.  Neuro: Cranial nerves intact, reflexes equal bilaterally. Normal muscle tone, no cerebellar symptoms. Sensation intact.  Pysch: Alert and oriented X 3, normal affect, Insight and Judgment appropriate.    Assessment and Plan  1. Annual Preventative Screening Examination   2. Essential hypertension  - EKG 12-Lead - Urinalysis, Routine w reflex microscopic - Microalbumin / creatinine urine ratio - CBC with Differential/Platelet - COMPLETE METABOLIC PANEL WITH GFR - Magnesium - TSH   3. Hyperlipidemia, mixed  - EKG 12-Lead - Lipid panel - TSH   4. Abnormal glucose  - EKG 12-Lead - Hemoglobin A1c - Insulin, random  5. Vitamin D deficiency  - VITAMIN D 25 Hydroxy    6. Hypothyroidism,   - TSH   7. Gastroesophageal reflux disease without esophagitis  8. Screening for colorectal cancer  - POC Hemoccult Bld/Stl (3-Cd Home Screen); Future   9. Screening for heart disease  - EKG 12-Lead   10. FHx: heart disease  - EKG 12-Lead   11. Medication management  - Urinalysis, Routine w reflex microscopic - Microalbumin / creatinine urine ratio - CBC with Differential/Platelet - COMPLETE METABOLIC PANEL WITH GFR - Magnesium - Lipid panel - TSH - Hemoglobin A1c - Insulin, random - VITAMIN D 25 Hydroxy            Patient was counseled in prudent diet to achieve/maintain BMI less than 25 for weight control, BP monitoring, regular exercise and medications. Discussed med's effects and SE's. Screening labs and tests as requested with regular follow-up as recommended. Over 40 minutes of exam,  counseling, chart review and high complex critical decision making was performed.   Marinus Maw, MD

## 2023-04-17 NOTE — Patient Instructions (Signed)
Due to recent changes in healthcare laws, you Dudash see the results of your imaging and laboratory studies on MyChart before your provider has had a chance to review them.  We understand that in some cases there Gin be results that are confusing or concerning to you. Not all laboratory results come back in the same time frame and the provider Hoback be waiting for multiple results in order to interpret others.  Please give [place] 48 hours in order for your provider to thoroughly review all the results before contacting the office for clarification of your results.   +++++++++++++++++++++++++  Vit D  & Vit C 1,000 mg   are recommended to help protect  against the Covid-19 and other Corona viruses.    Also it's recommended  to take  Zinc 50 mg  to help  protect against the Covid-19   and best place to get  is also on [email or web address]  and don't pay more than 6-8 cents /pill !  ================================ Coronavirus (COVID-19) Are you at risk?  Are you at risk for the Coronavirus (COVID-19)?  To be considered HIGH RISK for Coronavirus (COVID-19), you have to meet the following criteria:  Traveled to [place], [place], [place], [place] or [place]; or in the [place] to [place], [place], [place]  or [place]; and have fever, cough, and shortness of breath within the last 2 weeks of travel OR Been in close contact with a person diagnosed with COVID-19 within the last 2 weeks and have  fever, cough,and shortness of breath  IF YOU DO NOT MEET THESE CRITERIA, YOU ARE CONSIDERED LOW RISK FOR COVID-19.  What to do if you are HIGH RISK for COVID-19?  If you are having a medical emergency, call 911. Seek medical care right away. Before you go to a doctor's office, urgent care or emergency department,  call ahead and tell them about your recent travel, contact with someone diagnosed with COVID-19   and your symptoms.  You should receive instructions from your physician's office regarding next  steps of care.  When you arrive at healthcare provider, tell the healthcare staff immediately you have returned from  visiting [place], [place], [place], [place] or [place]; or traveled in the [place] to [place], [place],  [place] or [place] in the last two weeks or you have been in close contact with a person diagnosed with  COVID-19 in the last 2 weeks.   Tell the health care staff about your symptoms: fever, cough and shortness of breath. After you have been seen by a medical provider, you will be either: Tested for (COVID-19) and discharged home on quarantine except to seek medical care if  symptoms worsen, and asked to  Stay home and avoid contact with others until you get your results (4-5 days)  Avoid travel on public transportation if possible (such as bus, train, or airplane) or Sent to the Emergency Department by EMS for evaluation, COVID-19 testing  and  possible admission depending on your condition and test results.  What to do if you are LOW RISK for COVID-19?  Reduce your risk of any infection by using the same precautions used for avoiding the common cold or flu:  Wash your hands often with soap and warm water for at least 20 seconds.  If soap and water are not readily available,  use an alcohol-based hand sanitizer with at least 60% alcohol.  If coughing or sneezing, cover your mouth and nose by coughing  or sneezing into the elbow areas of your shirt or coat,  into a tissue or into your sleeve (not your hands). Avoid shaking hands with others and consider head nods or verbal greetings only. Avoid touching your eyes, nose, or mouth with unwashed hands.  Avoid close contact with people who are sick. Avoid places or events with large numbers of people in one location, like concerts or sporting events. Carefully consider travel plans you have or are making. If you are planning any travel outside or inside the [place], visit the CDC's Travelers' Health webpage for the  latest health notices. If you have some symptoms but not all symptoms, continue to monitor at home and seek medical attention  if your symptoms worsen. If you are having a medical emergency, call 911.   >>>>>>>>>>>>>>>>>>>>>>>>>>>>>>>>>  We Do NOT Approve of [employer] > > > > > > > > > > > > > > > > > > > > > > > > > > > > > > > > > > > > > > >  Preventive Care for Adults  A healthy lifestyle and preventive care can promote health and wellness. Preventive health guidelines for women include the following key practices. A routine yearly physical is a good way to check with your health care provider about your health and preventive screening. It is a chance to share any concerns and updates on your health and to receive a thorough exam. Visit your dentist for a routine exam and preventive care every 6 months. Brush your teeth twice a day and floss once a day. Good oral hygiene prevents tooth decay and gum disease. The frequency of eye exams is based on your age, health, family medical history, use of contact lenses, and other factors. Follow your health care provider's recommendations for frequency of eye exams. Eat a healthy diet. Foods like vegetables, fruits, whole grains, low-fat dairy products, and lean protein foods contain the nutrients you need without too many calories. Decrease your intake of foods high in solid fats, added sugars, and salt. Eat the right amount of calories for you. Get information about a proper diet from your health care provider, if necessary. Regular physical exercise is one of the most important things you can do for your health. Most adults should get at least 150 minutes of moderate-intensity exercise (any activity that increases your heart rate and causes you to sweat) each week. In addition, most adults need muscle-strengthening exercises on 2 or more days a week. Maintain a healthy weight. The body mass index (BMI) is a screening tool to identify  possible weight problems. It provides an estimate of body fat based on height and weight. Your health care provider can find your BMI and can help you achieve or maintain a healthy weight. For adults 20 years and older: A BMI below 18.5 is considered underweight. A BMI of 18.5 to 24.9 is normal. A BMI of 25 to 29.9 is considered overweight. A BMI of 30 and above is considered obese. Maintain normal blood lipids and cholesterol levels by exercising and minimizing your intake of saturated fat. Eat a balanced diet with plenty of fruit and vegetables. If your lipid or cholesterol levels are high, you are over 50, or you are at high risk for heart disease, you may need your cholesterol levels checked more frequently. Ongoing high lipid and cholesterol levels should be treated with medicines if diet and exercise are not working. If you smoke, find out from   your health care provider how to quit. If you do not use tobacco, do not start. Lung cancer screening is recommended for adults aged 55-80 years who are at high risk for developing lung cancer because of a history of smoking. A yearly low-dose CT scan of the lungs is recommended for people who have at least a 30-pack-year history of smoking and are a current smoker or have quit within the past 15 years. A pack year of smoking is smoking an average of 1 pack of cigarettes a day for 1 year (for example: 1 pack a day for 30 years or 2 packs a day for 15 years). Yearly screening should continue until the smoker has stopped smoking for at least 15 years. Yearly screening should be stopped for people who develop a health problem that would prevent them from having lung cancer treatment. Avoid use of street drugs. Do not share needles with anyone. Ask for help if you need support or instructions about stopping the use of drugs. High blood pressure causes heart disease and increases the risk of stroke.  Ongoing high blood pressure should be treated with medicines if  weight loss and exercise do not work. If you are 55-79 years old, ask your health care provider if you should take aspirin to prevent strokes. Diabetes screening involves taking a blood sample to check your fasting blood sugar level. This should be done once every 3 years, after age 45, if you are within normal weight and without risk factors for diabetes. Testing should be considered at a younger age or be carried out more frequently if you are overweight and have at least 1 risk factor for diabetes. Breast cancer screening is essential preventive care for women. You should practice "breast self-awareness." This means understanding the normal appearance and feel of your breasts and may include breast self-examination. Any changes detected, no matter how small, should be reported to a health care provider. Women in their 20s and 30s should have a clinical breast exam (CBE) by a health care provider as part of a regular health exam every 1 to 3 years. After age 40, women should have a CBE every year. Starting at age 40, women should consider having a mammogram (breast X-ray test) every year. Women who have a family history of breast cancer should talk to their health care provider about genetic screening. Women at a high risk of breast cancer should talk to their health care providers about having an MRI and a mammogram every year. Breast cancer gene (BRCA)-related cancer risk assessment is recommended for women who have family members with BRCA-related cancers. BRCA-related cancers include breast, ovarian, tubal, and peritoneal cancers. Having family members with these cancers may be associated with an increased risk for harmful changes (mutations) in the breast cancer genes BRCA1 and BRCA2. Results of the assessment will determine the need for genetic counseling and BRCA1 and BRCA2 testing. Routine pelvic exams to screen for cancer are no longer recommended for nonpregnant women who are considered low risk for  cancer of the pelvic organs (ovaries, uterus, and vagina) and who do not have symptoms. Ask your health care provider if a screening pelvic exam is right for you. If you have had past treatment for cervical cancer or a condition that could lead to cancer, you need Pap tests and screening for cancer for at least 20 years after your treatment. If Pap tests have been discontinued, your risk factors (such as having a new sexual partner) need to be   reassessed to determine if screening should be resumed. Some women have medical problems that increase the chance of getting cervical cancer. In these cases, your health care provider may recommend more frequent screening and Pap tests.  Colorectal cancer can be detected and often prevented. Most routine colorectal cancer screening begins at the age of 9 years and continues through age 47 years. However, your health care provider may recommend screening at an earlier age if you have risk factors for colon cancer. On a yearly basis, your health care provider may provide home test kits to check for hidden blood in the stool. Use of a small camera at the end of a tube, to directly examine the colon (sigmoidoscopy or colonoscopy), can detect the earliest forms of colorectal cancer. Talk to your health care provider about this at age 66, when routine screening begins.  Direct exam of the colon should be repeated every 5-10 years through age 98 years, unless early forms of pre-cancerous polyps or small growths are found. Osteoporosis is a disease in which the bones lose minerals and strength with aging. This can result in serious bone fractures or breaks. The risk of osteoporosis can be identified using a bone density scan. Women ages 48 years and over and women at risk for fractures or osteoporosis should discuss screening with their health care providers. Ask your health care provider whether you should take a calcium supplement or vitamin D to reduce the rate of  osteoporosis. Menopause can be associated with physical symptoms and risks. Hormone replacement therapy is available to decrease symptoms and risks. You should talk to your health care provider about whether hormone replacement therapy is right for you. Use sunscreen. Apply sunscreen liberally and repeatedly throughout the day. You should seek shade when your shadow is shorter than you. Protect yourself by wearing long sleeves, pants, a wide-brimmed hat, and sunglasses year round, whenever you are outdoors. Once a month, do a whole body skin exam, using a mirror to look at the skin on your back. Tell your health care provider of new moles, moles that have irregular borders, moles that are larger than a pencil eraser, or moles that have changed in shape or color. Stay current with required vaccines (immunizations). Influenza vaccine. All adults should be immunized every year. Tetanus, diphtheria, and acellular pertussis (Td, Tdap) vaccine. Pregnant women should receive 1 dose of Tdap vaccine during each pregnancy. The dose should be obtained regardless of the length of time since the last dose. Immunization is preferred during the 27th-36th week of gestation. An adult who has not previously received Tdap or who does not know her vaccine status should receive 1 dose of Tdap. This initial dose should be followed by tetanus and diphtheria toxoids (Td) booster doses every 10 years. Adults with an unknown or incomplete history of completing a 3-dose immunization series with Td-containing vaccines should begin or complete a primary immunization series including a Tdap dose. Adults should receive a Td booster every 10 years.  Zoster vaccine. One dose is recommended for adults aged 47 years or older unless certain conditions are present.  Pneumococcal 13-valent conjugate (PCV13) vaccine. When indicated, a person who is uncertain of her immunization history and has no record of immunization should receive the PCV13  vaccine. An adult aged 63 years or older who has certain medical conditions and has not been previously immunized should receive 1 dose of PCV13 vaccine. This PCV13 should be followed with a dose of pneumococcal polysaccharide (PPSV23) vaccine. The PPSV23  vaccine dose should be obtained at least 1 or more year(s) after the dose of PCV13 vaccine. An adult aged 19 years or older who has certain medical conditions and previously received 1 or more doses of PPSV23 vaccine should receive 1 dose of PCV13. The PCV13 vaccine dose should be obtained 1 or more years after the last PPSV23 vaccine dose.  Pneumococcal polysaccharide (PPSV23) vaccine. When PCV13 is also indicated, PCV13 should be obtained first. All adults aged 65 years and older should be immunized. An adult younger than age 65 years who has certain medical conditions should be immunized. Any person who resides in a nursing home or long-term care facility should be immunized. An adult smoker should be immunized. People with an immunocompromised condition and certain other conditions should receive both PCV13 and PPSV23 vaccines. People with human immunodeficiency virus (HIV) infection should be immunized as soon as possible after diagnosis. Immunization during chemotherapy or radiation therapy should be avoided. Routine use of PPSV23 vaccine is not recommended for American Indians, [place] Natives, or people younger than 65 years unless there are medical conditions that require PPSV23 vaccine. When indicated, people who have unknown immunization and have no record of immunization should receive PPSV23 vaccine. One-time revaccination 5 years after the first dose of PPSV23 is recommended for people aged 19-64 years who have chronic kidney failure, nephrotic syndrome, asplenia, or immunocompromised conditions. People who received 1-2 doses of PPSV23 before age 65 years should receive another dose of PPSV23 vaccine at age 65 years or later if at least 5 years have  passed since the previous dose. Doses of PPSV23 are not needed for people immunized with PPSV23 at or after age 65 years.  Preventive Services / Frequency  Ages 65 years and over Blood pressure check. Lipid and cholesterol check. Lung cancer screening. / Every year if you are aged 55-80 years and have a 30-pack-year history of smoking and currently smoke or have quit within the past 15 years. Yearly screening is stopped once you have quit smoking for at least 15 years or develop a health problem that would prevent you from having lung cancer treatment. Clinical breast exam.** / Every year after age 40 years.  BRCA-related cancer risk assessment.** / For women who have family members with a BRCA-related cancer (breast, ovarian, tubal, or peritoneal cancers). Mammogram.** / Every year beginning at age 40 years and continuing for as long as you are in good health. Consult with your health care provider. Pap test.** / Every 3 years starting at age 30 years through age 65 or 70 years with 3 consecutive normal Pap tests. Testing can be stopped between 65 and 70 years with 3 consecutive normal Pap tests and no abnormal Pap or HPV tests in the past 10 years. Fecal occult blood test (FOBT) of stool. / Every year beginning at age 50 years and continuing until age 75 years. You may not need to do this test if you get a colonoscopy every 10 years. Flexible sigmoidoscopy or colonoscopy.** / Every 5 years for a flexible sigmoidoscopy or every 10 years for a colonoscopy beginning at age 50 years and continuing until age 75 years. Hepatitis C blood test.** / For all people born from 1945 through 1965 and any individual with known risks for hepatitis C. Osteoporosis screening.** / A one-time screening for women ages 65 years and over and women at risk for fractures or osteoporosis. Skin self-exam. / Monthly. Influenza vaccine. / Every year. Tetanus, diphtheria, and acellular pertussis (Tdap/Td) vaccine.** /   1 dose  of Td every 10 years. Zoster vaccine.** / 1 dose for adults aged 60 years or older. Pneumococcal 13-valent conjugate (PCV13) vaccine.** / Consult your health care provider. Pneumococcal polysaccharide (PPSV23) vaccine.** / 1 dose for all adults aged 65 years and older. Screening for abdominal aortic aneurysm (AAA)  by ultrasound is recommended for people who have history of high blood pressure or who are current or former smokers. ++++++++++++++++++++ Recommend Adult Low Dose Aspirin or  coated  Aspirin 81 mg daily  To reduce risk of Colon Cancer 40 %,  Skin Cancer 26 % ,  Melanoma 46%  and  Pancreatic cancer 60% ++++++++++++++++++++ Vitamin D goal  is between 70-100.  Please make sure that you are taking your Vitamin D as directed.  It is very important as a natural anti-inflammatory  helping hair, skin, and nails, as well as reducing stroke and heart attack risk.  It helps your bones and helps with mood. It also decreases numerous cancer risks so please take it as directed.  Low Vit D is associated with a 200-300% higher risk for CANCER  and 200-300% higher risk for HEART   ATTACK  &  STROKE.   ...................................... It is also associated with higher death rate at younger ages,  autoimmune diseases like Rheumatoid arthritis, Lupus, Multiple Sclerosis.    Also many other serious conditions, like depression, Alzheimer's Dementia, infertility, muscle aches, fatigue, fibromyalgia - just to name a few. ++++++++++++++++++ Recommend the book "The END of DIETING" by Dr Joel Fuhrman  & the book "The END of DIABETES " by Dr Joel Fuhrman At [employer] - get book & Audio CD's    Being diabetic has a  300% increased risk for heart attack, stroke, cancer, and alzheimer- type vascular dementia. It is very important that you work harder with diet by avoiding all foods that are white. Avoid white rice (brown & wild rice is OK), white potatoes (sweetpotatoes in moderation is OK),  White bread or wheat bread or anything made out of white flour like bagels, donuts, rolls, buns, biscuits, cakes, pastries, cookies, pizza crust, and pasta (made from white flour & egg whites) - vegetarian pasta or spinach or wheat pasta is OK. Multigrain breads like Arnold's or [employer], or multigrain sandwich thins or flatbreads.  Diet, exercise and weight loss can reverse and cure diabetes in the early stages.  Diet, exercise and weight loss is very important in the control and prevention of complications of diabetes which affects every system in your body, ie. Brain - dementia/stroke, eyes - glaucoma/blindness, heart - heart attack/heart failure, kidneys - dialysis, stomach - gastric paralysis, intestines - malabsorption, nerves - severe painful neuritis, circulation - gangrene & loss of a leg(s), and finally cancer and Alzheimers.    I recommend avoid fried & greasy foods,  sweets/candy, white rice (brown or wild rice or Quinoa is OK), white potatoes (sweet potatoes are OK) - anything made from white flour - bagels, doughnuts, rolls, buns, biscuits,white and wheat breads, pizza crust and traditional pasta made of white flour & egg white(vegetarian pasta or spinach or wheat pasta is OK).  Multi-grain bread is OK - like multi-grain flat bread or sandwich thins. Avoid alcohol in excess. Exercise is also important.    Eat all the vegetables you want - avoid meat, especially red meat and dairy - especially cheese.  Cheese is the most concentrated form of trans-fats which is the worst thing to clog up our arteries. Veggie cheese is OK   which can be found in the fresh produce section at [employer] or [employer] or [employer]  +++++++++++++++++++ DASH Eating Plan  DASH stands for "Dietary Approaches to Stop Hypertension."   The DASH eating plan is a healthy eating plan that has been shown to reduce high blood pressure (hypertension). Additional health benefits may include reducing the risk of type 2  diabetes mellitus, heart disease, and stroke. The DASH eating plan may also help with weight loss. WHAT DO I NEED TO KNOW ABOUT THE DASH EATING PLAN? For the DASH eating plan, you will follow these general guidelines: Choose foods with a percent daily value for sodium of less than 5% (as listed on the food label). Use salt-free seasonings or herbs instead of table salt or sea salt. Check with your health care provider or pharmacist before using salt substitutes. Eat lower-sodium products, often labeled as "lower sodium" or "no salt added." Eat fresh foods. Eat more vegetables, fruits, and low-fat dairy products. Choose whole grains. Look for the word "whole" as the first word in the ingredient list. Choose fish  Limit sweets, desserts, sugars, and sugary drinks. Choose heart-healthy fats. Eat veggie cheese  Eat more home-cooked food and less restaurant, buffet, and fast food. Limit fried foods. Cook foods using methods other than frying. Limit canned vegetables. If you do use them, rinse them well to decrease the sodium. When eating at a restaurant, ask that your food be prepared with less salt, or no salt if possible.                      WHAT FOODS CAN I EAT? Read Dr Joel Fuhrman's books on The End of Dieting & The End of Diabetes  Grains Whole grain or whole wheat bread. Brown rice. Whole grain or whole wheat pasta. Quinoa, bulgur, and whole grain cereals. Low-sodium cereals. Corn or whole wheat flour tortillas. Whole grain cornbread. Whole grain crackers. Low-sodium crackers.  Vegetables Fresh or frozen vegetables (raw, steamed, roasted, or grilled). Low-sodium or reduced-sodium tomato and vegetable juices. Low-sodium or reduced-sodium tomato sauce and paste. Low-sodium or reduced-sodium canned vegetables.   Fruits All fresh, canned (in natural juice), or frozen fruits.  Protein Products  All fish and seafood.  Dried beans, peas, or lentils. Unsalted nuts and seeds. Unsalted  canned beans.  Dairy Low-fat dairy products, such as skim or 1% milk, 2% or reduced-fat cheeses, low-fat ricotta or cottage cheese, or plain low-fat yogurt. Low-sodium or reduced-sodium cheeses.  Fats and Oils Tub margarines without trans fats. Light or reduced-fat mayonnaise and salad dressings (reduced sodium). Avocado. Safflower, olive, or canola oils. Natural peanut or almond butter.  Other Unsalted popcorn and pretzels. The items listed above may not be a complete list of recommended foods or beverages. Contact your dietitian for more options.  +++++++++++++++  WHAT FOODS ARE NOT RECOMMENDED? Grains/ White flour or wheat flour White bread. White pasta. White rice. Refined cornbread. Bagels and croissants. Crackers that contain trans fat.  Vegetables  Creamed or fried vegetables. Vegetables in a . Regular canned vegetables. Regular canned tomato sauce and paste. Regular tomato and vegetable juices.  Fruits Dried fruits. Canned fruit in light or heavy syrup. Fruit juice.  Meat and Other Protein Products Meat in general - RED meat & White meat.  Fatty cuts of meat. Ribs, chicken wings, all processed meats as bacon, sausage, bologna, salami, fatback, hot dogs, bratwurst and packaged luncheon meats.  Dairy Whole or 2% milk, cream, half-and-half, and cream cheese.   Whole-fat or sweetened yogurt. Full-fat cheeses or blue cheese. Non-dairy creamers and whipped toppings. Processed cheese, cheese spreads, or cheese curds.  Condiments Onion and garlic salt, seasoned salt, table salt, and sea salt. Canned and packaged gravies. Worcestershire sauce. Tartar sauce. Barbecue sauce. Teriyaki sauce. Soy sauce, including reduced sodium. Steak sauce. Fish sauce. Oyster sauce. Cocktail sauce. Horseradish. Ketchup and mustard. Meat flavorings and tenderizers. Bouillon cubes. Hot sauce. Tabasco sauce. Marinades. Taco seasonings. Relishes.  Fats and Oils Butter, stick margarine, lard, shortening and  bacon fat. Coconut, palm kernel, or palm oils. Regular salad dressings.  Pickles and olives. Salted popcorn and pretzels.  The items listed above may not be a complete list of foods and beverages to avoid.   

## 2023-04-29 ENCOUNTER — Ambulatory Visit
Admission: RE | Admit: 2023-04-29 | Discharge: 2023-04-29 | Disposition: A | Discharge status: Home / Self Care | Payer: Medicare Other | Source: Ambulatory Visit | Attending: Internal Medicine | Admitting: Internal Medicine

## 2023-04-29 DIAGNOSIS — Z1231 Encounter for screening mammogram for malignant neoplasm of breast: Secondary | ICD-10-CM | POA: Diagnosis not present

## 2023-05-25 ENCOUNTER — Other Ambulatory Visit: Payer: Self-pay | Admitting: Nurse Practitioner

## 2023-06-10 ENCOUNTER — Ambulatory Visit (INDEPENDENT_AMBULATORY_CARE_PROVIDER_SITE_OTHER): Payer: Medicare Other | Admitting: Internal Medicine

## 2023-06-10 ENCOUNTER — Encounter: Payer: Self-pay | Admitting: Internal Medicine

## 2023-06-10 VITALS — BP 136/78 | HR 84 | Temp 97.9°F | Resp 16 | Ht 64.0 in | Wt 145.4 lb

## 2023-06-10 DIAGNOSIS — I10 Essential (primary) hypertension: Secondary | ICD-10-CM

## 2023-06-10 DIAGNOSIS — E559 Vitamin D deficiency, unspecified: Secondary | ICD-10-CM | POA: Diagnosis not present

## 2023-06-10 DIAGNOSIS — Z79899 Other long term (current) drug therapy: Secondary | ICD-10-CM | POA: Diagnosis not present

## 2023-06-10 DIAGNOSIS — Z0000 Encounter for general adult medical examination without abnormal findings: Secondary | ICD-10-CM

## 2023-06-10 DIAGNOSIS — K219 Gastro-esophageal reflux disease without esophagitis: Secondary | ICD-10-CM

## 2023-06-10 DIAGNOSIS — Z8249 Family history of ischemic heart disease and other diseases of the circulatory system: Secondary | ICD-10-CM

## 2023-06-10 DIAGNOSIS — Z0001 Encounter for general adult medical examination with abnormal findings: Secondary | ICD-10-CM

## 2023-06-10 DIAGNOSIS — Z860100 Personal history of colon polyps, unspecified: Secondary | ICD-10-CM

## 2023-06-10 DIAGNOSIS — R7309 Other abnormal glucose: Secondary | ICD-10-CM

## 2023-06-10 DIAGNOSIS — E039 Hypothyroidism, unspecified: Secondary | ICD-10-CM

## 2023-06-10 DIAGNOSIS — Z136 Encounter for screening for cardiovascular disorders: Secondary | ICD-10-CM

## 2023-06-10 DIAGNOSIS — Z1211 Encounter for screening for malignant neoplasm of colon: Secondary | ICD-10-CM

## 2023-06-10 DIAGNOSIS — E782 Mixed hyperlipidemia: Secondary | ICD-10-CM

## 2023-06-10 NOTE — Patient Instructions (Signed)
Due to recent changes in healthcare laws, you may see the results of your imaging and laboratory studies on MyChart before your provider has had a chance to review them.  We understand that in some cases there may be results that are confusing or concerning to you. Not all laboratory results come back in the same time frame and the provider may be waiting for multiple results in order to interpret others.  Please give us 48 hours in order for your provider to thoroughly review all the results before contacting the office for clarification of your results.   +++++++++++++++++++++++++  Vit D  & Vit C 1,000 mg   are recommended to help protect  against the Covid-19 and other Corona viruses.    Also it's recommended  to take  Zinc 50 mg  to help  protect against the Covid-19   and best place to get  is also on [employer]  and don't pay more than 6-8 cents /pill !  ================================ Coronavirus (COVID-19) Are you at risk?  Are you at risk for the Coronavirus (COVID-19)?  To be considered HIGH RISK for Coronavirus (COVID-19), you have to meet the following criteria:  Traveled to [place], [place], [place], [place] or [place]; or in the [place] to [place], [place], [place]  or [place]; and have fever, cough, and shortness of breath within the last 2 weeks of travel OR Been in close contact with a person diagnosed with COVID-19 within the last 2 weeks and have  fever, cough,and shortness of breath  IF YOU DO NOT MEET THESE CRITERIA, YOU ARE CONSIDERED LOW RISK FOR COVID-19.  What to do if you are HIGH RISK for COVID-19?  If you are having a medical emergency, call 911. Seek medical care right away. Before you go to a doctor's office, urgent care or emergency department,  call ahead and tell them about your recent travel, contact with someone diagnosed with COVID-19   and your symptoms.  You should receive instructions from your physician's office regarding next  steps of care.  When you arrive at healthcare provider, tell the healthcare staff immediately you have returned from  visiting [place], [place], [place], [place] or [place]; or traveled in the [place] to [place], [place],  [place] or [place] in the last two weeks or you have been in close contact with a person diagnosed with  COVID-19 in the last 2 weeks.   Tell the health care staff about your symptoms: fever, cough and shortness of breath. After you have been seen by a medical provider, you will be either: Tested for (COVID-19) and discharged home on quarantine except to seek medical care if  symptoms worsen, and asked to  Stay home and avoid contact with others until you get your results (4-5 days)  Avoid travel on public transportation if possible (such as bus, train, or airplane) or Sent to the Emergency Department by EMS for evaluation, COVID-19 testing  and  possible admission depending on your condition and test results.  What to do if you are LOW RISK for COVID-19?  Reduce your risk of any infection by using the same precautions used for avoiding the common cold or flu:  Wash your hands often with soap and warm water for at least 20 seconds.  If soap and water are not readily available,  use an alcohol-based hand sanitizer with at least 60% alcohol.  If coughing or sneezing, cover your mouth and nose by coughing   or sneezing into the elbow areas of your shirt or coat,  into a tissue or into your sleeve (not your hands). Avoid shaking hands with others and consider head nods or verbal greetings only. Avoid touching your eyes, nose, or mouth with unwashed hands.  Avoid close contact with people who are sick. Avoid places or events with large numbers of people in one location, like concerts or sporting events. Carefully consider travel plans you have or are making. If you are planning any travel outside or inside the [place], visit the CDC's Travelers' Health webpage for the  latest health notices. If you have some symptoms but not all symptoms, continue to monitor at home and seek medical attention  if your symptoms worsen. If you are having a medical emergency, call 911.   >>>>>>>>>>>>>>>>>>>>>>>>>>>>>>>>>  We Do NOT Approve of [employer] > > > > > > > > > > > > > > > > > > > > > > > > > > > > > > > > > > > > > > >  Preventive Care for Adults  A healthy lifestyle and preventive care can promote health and wellness. Preventive health guidelines for women include the following key practices. A routine yearly physical is a good way to check with your health care provider about your health and preventive screening. It is a chance to share any concerns and updates on your health and to receive a thorough exam. Visit your dentist for a routine exam and preventive care every 6 months. Brush your teeth twice a day and floss once a day. Good oral hygiene prevents tooth decay and gum disease. The frequency of eye exams is based on your age, health, family medical history, use of contact lenses, and other factors. Follow your health care provider's recommendations for frequency of eye exams. Eat a healthy diet. Foods like vegetables, fruits, whole grains, low-fat dairy products, and lean protein foods contain the nutrients you need without too many calories. Decrease your intake of foods high in solid fats, added sugars, and salt. Eat the right amount of calories for you. Get information about a proper diet from your health care provider, if necessary. Regular physical exercise is one of the most important things you can do for your health. Most adults should get at least 150 minutes of moderate-intensity exercise (any activity that increases your heart rate and causes you to sweat) each week. In addition, most adults need muscle-strengthening exercises on 2 or more days a week. Maintain a healthy weight. The body mass index (BMI) is a screening tool to identify  possible weight problems. It provides an estimate of body fat based on height and weight. Your health care provider can find your BMI and can help you achieve or maintain a healthy weight. For adults 20 years and older: A BMI below 18.5 is considered underweight. A BMI of 18.5 to 24.9 is normal. A BMI of 25 to 29.9 is considered overweight. A BMI of 30 and above is considered obese. Maintain normal blood lipids and cholesterol levels by exercising and minimizing your intake of saturated fat. Eat a balanced diet with plenty of fruit and vegetables. If your lipid or cholesterol levels are high, you are over 50, or you are at high risk for heart disease, you may need your cholesterol levels checked more frequently. Ongoing high lipid and cholesterol levels should be treated with medicines if diet and exercise are not working. If you smoke, find out from   your health care provider how to quit. If you do not use tobacco, do not start. Lung cancer screening is recommended for adults aged 55-80 years who are at high risk for developing lung cancer because of a history of smoking. A yearly low-dose CT scan of the lungs is recommended for people who have at least a 30-pack-year history of smoking and are a current smoker or have quit within the past 15 years. A pack year of smoking is smoking an average of 1 pack of cigarettes a day for 1 year (for example: 1 pack a day for 30 years or 2 packs a day for 15 years). Yearly screening should continue until the smoker has stopped smoking for at least 15 years. Yearly screening should be stopped for people who develop a health problem that would prevent them from having lung cancer treatment. Avoid use of street drugs. Do not share needles with anyone. Ask for help if you need support or instructions about stopping the use of drugs. High blood pressure causes heart disease and increases the risk of stroke.  Ongoing high blood pressure should be treated with medicines if  weight loss and exercise do not work. If you are 55-79 years old, ask your health care provider if you should take aspirin to prevent strokes. Diabetes screening involves taking a blood sample to check your fasting blood sugar level. This should be done once every 3 years, after age 45, if you are within normal weight and without risk factors for diabetes. Testing should be considered at a younger age or be carried out more frequently if you are overweight and have at least 1 risk factor for diabetes. Breast cancer screening is essential preventive care for women. You should practice "breast self-awareness." This means understanding the normal appearance and feel of your breasts and may include breast self-examination. Any changes detected, no matter how small, should be reported to a health care provider. Women in their 20s and 30s should have a clinical breast exam (CBE) by a health care provider as part of a regular health exam every 1 to 3 years. After age 40, women should have a CBE every year. Starting at age 40, women should consider having a mammogram (breast X-ray test) every year. Women who have a family history of breast cancer should talk to their health care provider about genetic screening. Women at a high risk of breast cancer should talk to their health care providers about having an MRI and a mammogram every year. Breast cancer gene (BRCA)-related cancer risk assessment is recommended for women who have family members with BRCA-related cancers. BRCA-related cancers include breast, ovarian, tubal, and peritoneal cancers. Having family members with these cancers may be associated with an increased risk for harmful changes (mutations) in the breast cancer genes BRCA1 and BRCA2. Results of the assessment will determine the need for genetic counseling and BRCA1 and BRCA2 testing. Routine pelvic exams to screen for cancer are no longer recommended for nonpregnant women who are considered low risk for  cancer of the pelvic organs (ovaries, uterus, and vagina) and who do not have symptoms. Ask your health care provider if a screening pelvic exam is right for you. If you have had past treatment for cervical cancer or a condition that could lead to cancer, you need Pap tests and screening for cancer for at least 20 years after your treatment. If Pap tests have been discontinued, your risk factors (such as having a new sexual partner) need to be   reassessed to determine if screening should be resumed. Some women have medical problems that increase the chance of getting cervical cancer. In these cases, your health care provider may recommend more frequent screening and Pap tests.  Colorectal cancer can be detected and often prevented. Most routine colorectal cancer screening begins at the age of 9 years and continues through age 47 years. However, your health care provider may recommend screening at an earlier age if you have risk factors for colon cancer. On a yearly basis, your health care provider may provide home test kits to check for hidden blood in the stool. Use of a small camera at the end of a tube, to directly examine the colon (sigmoidoscopy or colonoscopy), can detect the earliest forms of colorectal cancer. Talk to your health care provider about this at age 66, when routine screening begins.  Direct exam of the colon should be repeated every 5-10 years through age 98 years, unless early forms of pre-cancerous polyps or small growths are found. Osteoporosis is a disease in which the bones lose minerals and strength with aging. This can result in serious bone fractures or breaks. The risk of osteoporosis can be identified using a bone density scan. Women ages 48 years and over and women at risk for fractures or osteoporosis should discuss screening with their health care providers. Ask your health care provider whether you should take a calcium supplement or vitamin D to reduce the rate of  osteoporosis. Menopause can be associated with physical symptoms and risks. Hormone replacement therapy is available to decrease symptoms and risks. You should talk to your health care provider about whether hormone replacement therapy is right for you. Use sunscreen. Apply sunscreen liberally and repeatedly throughout the day. You should seek shade when your shadow is shorter than you. Protect yourself by wearing long sleeves, pants, a wide-brimmed hat, and sunglasses year round, whenever you are outdoors. Once a month, do a whole body skin exam, using a mirror to look at the skin on your back. Tell your health care provider of new moles, moles that have irregular borders, moles that are larger than a pencil eraser, or moles that have changed in shape or color. Stay current with required vaccines (immunizations). Influenza vaccine. All adults should be immunized every year. Tetanus, diphtheria, and acellular pertussis (Td, Tdap) vaccine. Pregnant women should receive 1 dose of Tdap vaccine during each pregnancy. The dose should be obtained regardless of the length of time since the last dose. Immunization is preferred during the 27th-36th week of gestation. An adult who has not previously received Tdap or who does not know her vaccine status should receive 1 dose of Tdap. This initial dose should be followed by tetanus and diphtheria toxoids (Td) booster doses every 10 years. Adults with an unknown or incomplete history of completing a 3-dose immunization series with Td-containing vaccines should begin or complete a primary immunization series including a Tdap dose. Adults should receive a Td booster every 10 years.  Zoster vaccine. One dose is recommended for adults aged 47 years or older unless certain conditions are present.  Pneumococcal 13-valent conjugate (PCV13) vaccine. When indicated, a person who is uncertain of her immunization history and has no record of immunization should receive the PCV13  vaccine. An adult aged 63 years or older who has certain medical conditions and has not been previously immunized should receive 1 dose of PCV13 vaccine. This PCV13 should be followed with a dose of pneumococcal polysaccharide (PPSV23) vaccine. The PPSV23  vaccine dose should be obtained at least 1 or more year(s) after the dose of PCV13 vaccine. An adult aged 19 years or older who has certain medical conditions and previously received 1 or more doses of PPSV23 vaccine should receive 1 dose of PCV13. The PCV13 vaccine dose should be obtained 1 or more years after the last PPSV23 vaccine dose.  Pneumococcal polysaccharide (PPSV23) vaccine. When PCV13 is also indicated, PCV13 should be obtained first. All adults aged 65 years and older should be immunized. An adult younger than age 65 years who has certain medical conditions should be immunized. Any person who resides in a nursing home or long-term care facility should be immunized. An adult smoker should be immunized. People with an immunocompromised condition and certain other conditions should receive both PCV13 and PPSV23 vaccines. People with human immunodeficiency virus (HIV) infection should be immunized as soon as possible after diagnosis. Immunization during chemotherapy or radiation therapy should be avoided. Routine use of PPSV23 vaccine is not recommended for American Indians, [place] Natives, or people younger than 65 years unless there are medical conditions that require PPSV23 vaccine. When indicated, people who have unknown immunization and have no record of immunization should receive PPSV23 vaccine. One-time revaccination 5 years after the first dose of PPSV23 is recommended for people aged 19-64 years who have chronic kidney failure, nephrotic syndrome, asplenia, or immunocompromised conditions. People who received 1-2 doses of PPSV23 before age 65 years should receive another dose of PPSV23 vaccine at age 65 years or later if at least 5 years have  passed since the previous dose. Doses of PPSV23 are not needed for people immunized with PPSV23 at or after age 65 years.  Preventive Services / Frequency  Ages 65 years and over Blood pressure check. Lipid and cholesterol check. Lung cancer screening. / Every year if you are aged 55-80 years and have a 30-pack-year history of smoking and currently smoke or have quit within the past 15 years. Yearly screening is stopped once you have quit smoking for at least 15 years or develop a health problem that would prevent you from having lung cancer treatment. Clinical breast exam.** / Every year after age 40 years.  BRCA-related cancer risk assessment.** / For women who have family members with a BRCA-related cancer (breast, ovarian, tubal, or peritoneal cancers). Mammogram.** / Every year beginning at age 40 years and continuing for as long as you are in good health. Consult with your health care provider. Pap test.** / Every 3 years starting at age 30 years through age 65 or 70 years with 3 consecutive normal Pap tests. Testing can be stopped between 65 and 70 years with 3 consecutive normal Pap tests and no abnormal Pap or HPV tests in the past 10 years. Fecal occult blood test (FOBT) of stool. / Every year beginning at age 50 years and continuing until age 75 years. You may not need to do this test if you get a colonoscopy every 10 years. Flexible sigmoidoscopy or colonoscopy.** / Every 5 years for a flexible sigmoidoscopy or every 10 years for a colonoscopy beginning at age 50 years and continuing until age 75 years. Hepatitis C blood test.** / For all people born from 1945 through 1965 and any individual with known risks for hepatitis C. Osteoporosis screening.** / A one-time screening for women ages 65 years and over and women at risk for fractures or osteoporosis. Skin self-exam. / Monthly. Influenza vaccine. / Every year. Tetanus, diphtheria, and acellular pertussis (Tdap/Td) vaccine.** /   1 dose  of Td every 10 years. Zoster vaccine.** / 1 dose for adults aged 60 years or older. Pneumococcal 13-valent conjugate (PCV13) vaccine.** / Consult your health care provider. Pneumococcal polysaccharide (PPSV23) vaccine.** / 1 dose for all adults aged 65 years and older. Screening for abdominal aortic aneurysm (AAA)  by ultrasound is recommended for people who have history of high blood pressure or who are current or former smokers. ++++++++++++++++++++ Recommend Adult Low Dose Aspirin or  coated  Aspirin 81 mg daily  To reduce risk of Colon Cancer 40 %,  Skin Cancer 26 % ,  Melanoma 46%  and  Pancreatic cancer 60% ++++++++++++++++++++ Vitamin D goal  is between 70-100.  Please make sure that you are taking your Vitamin D as directed.  It is very important as a natural anti-inflammatory  helping hair, skin, and nails, as well as reducing stroke and heart attack risk.  It helps your bones and helps with mood. It also decreases numerous cancer risks so please take it as directed.  Low Vit D is associated with a 200-300% higher risk for CANCER  and 200-300% higher risk for HEART   ATTACK  &  STROKE.   ...................................... It is also associated with higher death rate at younger ages,  autoimmune diseases like Rheumatoid arthritis, Lupus, Multiple Sclerosis.    Also many other serious conditions, like depression, Alzheimer's Dementia, infertility, muscle aches, fatigue, fibromyalgia - just to name a few. ++++++++++++++++++ Recommend the book "The END of DIETING" by Dr Joel Fuhrman  & the book "The END of DIABETES " by Dr Joel Fuhrman At [employer] - get book & Audio CD's    Being diabetic has a  300% increased risk for heart attack, stroke, cancer, and alzheimer- type vascular dementia. It is very important that you work harder with diet by avoiding all foods that are white. Avoid white rice (brown & wild rice is OK), white potatoes (sweetpotatoes in moderation is OK),  White bread or wheat bread or anything made out of white flour like bagels, donuts, rolls, buns, biscuits, cakes, pastries, cookies, pizza crust, and pasta (made from white flour & egg whites) - vegetarian pasta or spinach or wheat pasta is OK. Multigrain breads like Arnold's or Pepper[employer], or multigrain sandwich thins or flatbreads.  Diet, exercise and weight loss can reverse and cure diabetes in the early stages.  Diet, exercise and weight loss is very important in the control and prevention of complications of diabetes which affects every system in your body, ie. Brain - dementia/stroke, eyes - glaucoma/blindness, heart - heart attack/heart failure, kidneys - dialysis, stomach - gastric paralysis, intestines - malabsorption, nerves - severe painful neuritis, circulation - gangrene & loss of a leg(s), and finally cancer and Alzheimers.    I recommend avoid fried & greasy foods,  sweets/candy, white rice (brown or wild rice or Quinoa is OK), white potatoes (sweet potatoes are OK) - anything made from white flour - bagels, doughnuts, rolls, buns, biscuits,white and wheat breads, pizza crust and traditional pasta made of white flour & egg white(vegetarian pasta or spinach or wheat pasta is OK).  Multi-grain bread is OK - like multi-grain flat bread or sandwich thins. Avoid alcohol in excess. Exercise is also important.    Eat all the vegetables you want - avoid meat, especially red meat and dairy - especially cheese.  Cheese is the most concentrated form of trans-fats which is the worst thing to clog up our arteries. Veggie cheese is OK   which can be found in the fresh produce section at [employer] or [employer] or [employer]  +++++++++++++++++++ DASH Eating Plan  DASH stands for "Dietary Approaches to Stop Hypertension."   The DASH eating plan is a healthy eating plan that has been shown to reduce high blood pressure (hypertension). Additional health benefits may include reducing the risk of type 2  diabetes mellitus, heart disease, and stroke. The DASH eating plan may also help with weight loss. WHAT DO I NEED TO KNOW ABOUT THE DASH EATING PLAN? For the DASH eating plan, you will follow these general guidelines: Choose foods with a percent daily value for sodium of less than 5% (as listed on the food label). Use salt-free seasonings or herbs instead of table salt or sea salt. Check with your health care provider or pharmacist before using salt substitutes. Eat lower-sodium products, often labeled as "lower sodium" or "no salt added." Eat fresh foods. Eat more vegetables, fruits, and low-fat dairy products. Choose whole grains. Look for the word "whole" as the first word in the ingredient list. Choose fish  Limit sweets, desserts, sugars, and sugary drinks. Choose heart-healthy fats. Eat veggie cheese  Eat more home-cooked food and less restaurant, buffet, and fast food. Limit fried foods. Cook foods using methods other than frying. Limit canned vegetables. If you do use them, rinse them well to decrease the sodium. When eating at a restaurant, ask that your food be prepared with less salt, or no salt if possible.                      WHAT FOODS CAN I EAT? Read Dr Joel Fuhrman's books on The End of Dieting & The End of Diabetes  Grains Whole grain or whole wheat bread. Brown rice. Whole grain or whole wheat pasta. Quinoa, bulgur, and whole grain cereals. Low-sodium cereals. Corn or whole wheat flour tortillas. Whole grain cornbread. Whole grain crackers. Low-sodium crackers.  Vegetables Fresh or frozen vegetables (raw, steamed, roasted, or grilled). Low-sodium or reduced-sodium tomato and vegetable juices. Low-sodium or reduced-sodium tomato sauce and paste. Low-sodium or reduced-sodium canned vegetables.   Fruits All fresh, canned (in natural juice), or frozen fruits.  Protein Products  All fish and seafood.  Dried beans, peas, or lentils. Unsalted nuts and seeds. Unsalted  canned beans.  Dairy Low-fat dairy products, such as skim or 1% milk, 2% or reduced-fat cheeses, low-fat ricotta or cottage cheese, or plain low-fat yogurt. Low-sodium or reduced-sodium cheeses.  Fats and Oils Tub margarines without trans fats. Light or reduced-fat mayonnaise and salad dressings (reduced sodium). Avocado. Safflower, olive, or canola oils. Natural peanut or almond butter.  Other Unsalted popcorn and pretzels. The items listed above may not be a complete list of recommended foods or beverages. Contact your dietitian for more options.  +++++++++++++++  WHAT FOODS ARE NOT RECOMMENDED? Grains/ White flour or wheat flour White bread. White pasta. White rice. Refined cornbread. Bagels and croissants. Crackers that contain trans fat.  Vegetables  Creamed or fried vegetables. Vegetables in a . Regular canned vegetables. Regular canned tomato sauce and paste. Regular tomato and vegetable juices.  Fruits Dried fruits. Canned fruit in light or heavy syrup. Fruit juice.  Meat and Other Protein Products Meat in general - RED meat & White meat.  Fatty cuts of meat. Ribs, chicken wings, all processed meats as bacon, sausage, bologna, salami, fatback, hot dogs, bratwurst and packaged luncheon meats.  Dairy Whole or 2% milk, cream, half-and-half, and cream cheese.   Whole-fat or sweetened yogurt. Full-fat cheeses or blue cheese. Non-dairy creamers and whipped toppings. Processed cheese, cheese spreads, or cheese curds.  Condiments Onion and garlic salt, seasoned salt, table salt, and sea salt. Canned and packaged gravies. Worcestershire sauce. Tartar sauce. Barbecue sauce. Teriyaki sauce. Soy sauce, including reduced sodium. Steak sauce. Fish sauce. Oyster sauce. Cocktail sauce. Horseradish. Ketchup and mustard. Meat flavorings and tenderizers. Bouillon cubes. Hot sauce. Tabasco sauce. Marinades. Taco seasonings. Relishes.  Fats and Oils Butter, stick margarine, lard, shortening and  bacon fat. Coconut, palm kernel, or palm oils. Regular salad dressings.  Pickles and olives. Salted popcorn and pretzels.  The items listed above may not be a complete list of foods and beverages to avoid.   

## 2023-06-10 NOTE — Progress Notes (Signed)
Annual Screening/Preventative Visit & Comprehensive Evaluation &  Examination  Future Appointments  Date Time Provider Department  06/10/2023                     cpe 11:00 AM Lucky Cowboy, MD [facility]  09/04/2023                   wellness 11:00 AM Adela Glimpse, NP [facility]  06/16/2024                      cpe 11:00 AM Lucky Cowboy, MD [facility]        This very nice 82 y.o. WWF with  HTN, HLD, Hypothyroidism, Prediabetes  and Vitamin D Deficiency   presents for a Screening /Preventative Visit & comprehensive evaluation and management of multiple medical co-morbidities.  Patient's GERD is controlled on her meds.        HTN predates circa 1995. Her BP has been controlled at home and patient denies any cardiac symptoms as chest pain, palpitations, shortness of breath, dizziness or ankle swelling. Today's BP is at goal - 136/78 .       Her hyperlipidemia is controlled with diet and Rosuvastatin. Patient denies myalgias or other medication SE's. Last lipids were at goal  except elevated Trig's :  Lab Results  Component Value Date   CHOL 158 12/02/2022   HDL 58 12/02/2022   LDLCALC 72 12/02/2022   TRIG 188 (H) 12/02/2022   CHOLHDL 2.7 12/02/2022         Patient has hx/o prediabetes predating (A1c 6.0% /2014) and patient denies reactive hypoglycemic symptoms, visual blurring, diabetic polys or paresthesias. 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         Finally, patient has history of Vitamin D Deficiency and last Vitamin D was at goal :  Lab Results  Component Value Date   VD25OH 61 12/02/2022       Current Outpatient Medications on File Prior to Visit  Medication Sig   albuterol VENTOLIN HFA  inhaler Use  2 Inhalations   every 4 hrs as needed to Rescue Asthma Attack   VITAMIN C 500 MG CAPS Take 1 capsule daily.   aspirin 81 MG tablet Take daily.   Calcium -Cholecalciferol 1000 mg - 800 u Take daily   cetirizine 10 MG tablet Take 1 daily.   VITAMIN D 2000 UNITS  Take daily   cyanocobalamin 500 MCG tablet Take 500 mcg  daily.   diclofenac 1 % GEL APPLY 2 GRAMS  FOUR TIMES DAILY   LOMOTIL  tablet Take 1 to 2 tablets every 4 hours if needed for Diarrhea   levothyroxine  100 MCG tablet Take  1 tablet  Daily     Magnesium 500 MG TABS Take 1 tablet  daily.   montelukast  10 MG tablet TAKE 1 TABLET  DAILY    Multiple Vitamins-Minerals Take daily.   Omega-3 FISH OIL  Take daily.   omeprazole 20 MG capsule Take  1 capsule  Daily     Probiotic  Take daily.   rosuvastatin  20 MG tablet Take  1 tablet  Daily  for Cholesterol   verapamil-SR 120 MG CR  Take 1 tablet at bedtime for blood pressure.   zinc 50 MG tablet Take daily.    Allergies  Allergen Reactions   Adhesive [Tape] Hives   Codeine Hives   Fosamax [Alendronate Sodium] Other (See Comments)    Reflux increase   Penicillins Hives   Shellfish Allergy    Iodine Rash     Past Medical History:  Diagnosis Date   Anemia    Asthma    GERD (gastroesophageal reflux disease)    Goiter    Hyperlipidemia    Hypertension    MVP (mitral valve prolapse)    Osteopenia    Other abnormal glucose    Positive colorectal cancer screening using Cologuard test 08/11/2017     Health Maintenance  Topic Date Due   Hepatitis C Screening  Never done   Zoster Vaccines- Shingrix (1 of 2) Never done   COVID-19 Vaccine (3 - Mixed Product risk series) 11/16/2019   INFLUENZA VACCINE  04/09/2021   COLONOSCOPY (Pts 45-44yrs Insurance coverage will need to be confirmed)  03/31/2023   TETANUS/TDAP  08/30/2030   DEXA SCAN  Completed   PNA vac Low Risk Adult  Completed   HPV VACCINES  Aged Out    Immunization History  Administered Date(s) Administered   Influenza,  High Dose\ 06/20/2017, 05/19/2019, 06/06/2020   Influenza 06/28/2013, 07/06/2015, 06/09/2018   Moderna Sars-Covid-2 Vacc 09/30/2019, 10/19/2019   Pneumococcal -13 02/01/2014   Pneumococcal-23 09/10/2007   Tdap 12/05/2009, 08/30/2020   Zoster, Live 09/09/2005    Last Colon - 09/30/2017 - Dr Neomia Glass recc 5 yr f/u due Jan 2024.   Last MGM -  04/25/2022  Last dexa BMD - 02/21/2023    Past Surgical History:  Procedure Laterality Date   APPENDECTOMY     CHOLECYSTECTOMY     EYE SURGERY     TONSILLECTOMY AND ADENOIDECTOMY       Family History  Problem Relation Age of Onset   Hypertension Mother    Diabetes Father    Heart disease Father    Hyperlipidemia Sister    Hyperlipidemia Brother    Breast cancer Neg Hx      Social History   Tobacco Use   Smoking status: Never   Smokeless tobacco: Never  Substance Use Topics   Alcohol use: No   Drug use: No     ROS  Constitutional: Denies fever, chills, weight loss/gain, headaches, insomnia,  night sweats, and change in appetite. Does c/o fatigue. Eyes: Denies redness, blurred vision, diplopia, discharge, itchy, watery eyes.  ENT: Denies discharge, congestion, post nasal drip, epistaxis, sore throat, earache, hearing loss, dental pain, Tinnitus, Vertigo, Sinus pain, snoring.  Cardio: Denies chest pain, palpitations, irregular heartbeat, syncope, dyspnea, diaphoresis, orthopnea, PND, claudication, edema Respiratory: denies cough, dyspnea, DOE, pleurisy, hoarseness, laryngitis, wheezing.  Gastrointestinal: Denies dysphagia, heartburn, reflux, water brash, pain, cramps, nausea, vomiting, bloating, diarrhea, constipation, hematemesis, melena, hematochezia, jaundice, hemorrhoids Genitourinary: Denies dysuria, frequency, urgency, nocturia, hesitancy, discharge, hematuria, flank pain Breast: Breast lumps, nipple discharge, bleeding.  Musculoskeletal: Denies arthralgia, myalgia, stiffness, Jt. Swelling, pain, limp, and  strain/sprain. Denies falls. Skin: Denies puritis, rash, hives, warts, acne, eczema, changing in skin lesion Neuro: No weakness, tremor, incoordination, spasms, paresthesia, pain Psychiatric: Denies confusion, memory loss, sensory loss. Denies Depression. Endocrine: Denies change in weight, skin, hair change, nocturia, and paresthesia, diabetic polys, visual blurring, hyper / hypo glycemic episodes.  Heme/Lymph: No excessive bleeding, bruising, enlarged lymph nodes.  Physical Exam  BP 136/78   Pulse 84   Temp 97.9 F (36.6 C)   Resp 16   Ht 5\' 4"  (1.626 m)   Wt 145 lb 6.4 oz (66 kg)   SpO2 96%   BMI 24.96 kg/m   General Appearance: Well nourished, well groomed and in no apparent distress.  Eyes: PERRLA, EOMs, conjunctiva no swelling or erythema, normal fundi and vessels. Sinuses: No frontal/maxillary tenderness ENT/Mouth: EACs patent / TMs  nl. Nares clear without erythema, swelling, mucoid exudates. Oral hygiene is good. No erythema, swelling, or exudate. Tongue normal, non-obstructing. Tonsils not swollen or erythematous. Hearing normal.  Neck: Supple, thyroid not palpable. No bruits, nodes or JVD. Respiratory: Respiratory effort normal.  BS equal and clear bilateral without rales, rhonci, wheezing or stridor. Cardio: Heart sounds are normal with regular rate and rhythm and no murmurs, rubs or gallops. Peripheral pulses are normal and equal bilaterally without edema. No aortic or femoral bruits. Chest: symmetric with normal excursions and percussion. Breasts: Deferred to upcoming MGM.  Abdomen: Flat, soft with bowel sounds active. Nontender, no guarding, rebound, hernias, masses, or organomegaly.  Lymphatics: Non tender without lymphadenopathy.  Musculoskeletal: Full ROM all peripheral extremities, joint stability, 5/5 strength, and normal gait. Skin: Warm and dry without rashes, lesions, cyanosis, clubbing or  ecchymosis.  Neuro: Cranial nerves intact, reflexes equal bilaterally.  Normal muscle tone, no cerebellar symptoms. Sensation intact.  Pysch: Alert and oriented X 3, normal affect, Insight and Judgment appropriate.    Assessment and Plan  1. Annual Preventative Screening Examination   2. Essential hypertension  - EKG 12-Lead - Urinalysis, Routine w reflex microscopic - Microalbumin / creatinine urine ratio - CBC with Differential/Platelet - COMPLETE METABOLIC PANEL WITH GFR - Magnesium - TSH   3. Hyperlipidemia, mixed  - EKG 12-Lead - Lipid panel - TSH   4. Abnormal glucose  - EKG 12-Lead - Hemoglobin A1c - Insulin, random  5. Vitamin D deficiency  - VITAMIN D 25 Hydroxy    6. Hypothyroidism,   - TSH   7. Gastroesophageal reflux disease without esophagitis   8. Screening for colorectal cancer  - POC Hemoccult Bld/Stl (3-Cd Home Screen); Future   9. Screening for heart disease  - EKG 12-Lead   10. FHx: heart disease  - EKG 12-Lead   11. Medication management  - Urinalysis, Routine w reflex microscopic - Microalbumin / creatinine urine ratio - CBC with Differential/Platelet - COMPLETE METABOLIC PANEL WITH GFR - Magnesium - Lipid panel - TSH - Hemoglobin A1c - Insulin, random - VITAMIN D 25 Hydroxy            Patient was counseled in prudent diet to achieve/maintain BMI less than 25 for weight control, BP monitoring, regular exercise and medications. Discussed med's effects and SE's. Screening labs and tests as requested with regular follow-up as recommended. Over 40 minutes of exam, counseling, chart review and high complex critical decision making was performed.   Marinus Maw, MD

## 2023-06-11 LAB — COMPLETE METABOLIC PANEL WITH GFR
AG Ratio: 1.8 (calc) (ref 1.0–2.5)
ALT: 19 U/L (ref 6–29)
AST: 19 U/L (ref 10–35)
Albumin: 4.5 g/dL (ref 3.6–5.1)
Alkaline phosphatase (APISO): 58 U/L (ref 37–153)
BUN: 17 mg/dL (ref 7–25)
CO2: 26 mmol/L (ref 20–32)
Calcium: 10.1 mg/dL (ref 8.6–10.4)
Chloride: 106 mmol/L (ref 98–110)
Creat: 0.79 mg/dL (ref 0.60–0.95)
Globulin: 2.5 g/dL (ref 1.9–3.7)
Glucose, Bld: 103 mg/dL — ABNORMAL HIGH (ref 65–99)
Potassium: 4.4 mmol/L (ref 3.5–5.3)
Sodium: 140 mmol/L (ref 135–146)
Total Bilirubin: 0.4 mg/dL (ref 0.2–1.2)
Total Protein: 7 g/dL (ref 6.1–8.1)
eGFR: 76 mL/min/{1.73_m2} (ref >=60)

## 2023-06-11 LAB — CBC WITH DIFFERENTIAL/PLATELET
Absolute Monocytes: 650 {cells}/uL (ref 200–950)
Basophils Absolute: 52 {cells}/uL (ref 0–200)
Basophils Relative: 0.8 %
Eosinophils Absolute: 98 {cells}/uL (ref 15–500)
Eosinophils Relative: 1.5 %
HCT: 44.3 % (ref 35.0–45.0)
Hemoglobin: 14.6 g/dL (ref 11.7–15.5)
Lymphs Abs: 1853 {cells}/uL (ref 850–3900)
MCH: 31.3 pg (ref 27.0–33.0)
MCHC: 33 g/dL (ref 32.0–36.0)
MCV: 95.1 fL (ref 80.0–100.0)
MPV: 9.7 fL (ref 7.5–12.5)
Monocytes Relative: 10 %
Neutro Abs: 3848 {cells}/uL (ref 1500–7800)
Neutrophils Relative %: 59.2 %
Platelets: 286 10*3/uL (ref 140–400)
RBC: 4.66 10*6/uL (ref 3.80–5.10)
RDW: 12.2 % (ref 11.0–15.0)
Total Lymphocyte: 28.5 %
WBC: 6.5 10*3/uL (ref 3.8–10.8)

## 2023-06-11 LAB — HEMOGLOBIN A1C
Hgb A1c MFr Bld: 5.4 %{Hb} (ref <5.7)
Mean Plasma Glucose: 108 mg/dL
eAG (mmol/L): 6 mmol/L

## 2023-06-11 LAB — URINALYSIS, ROUTINE W REFLEX MICROSCOPIC
Bacteria, UA: NONE SEEN /[HPF]
Bilirubin Urine: NEGATIVE
Glucose, UA: NEGATIVE
Hgb urine dipstick: NEGATIVE
Hyaline Cast: NONE SEEN /[LPF]
Ketones, ur: NEGATIVE
Nitrite: NEGATIVE
Protein, ur: NEGATIVE
RBC / HPF: NONE SEEN /[HPF] (ref 0–2)
Specific Gravity, Urine: 1.004 (ref 1.001–1.035)
Squamous Epithelial / HPF: NONE SEEN /[HPF] (ref <=5)
WBC, UA: NONE SEEN /[HPF] (ref 0–5)
pH: 6 (ref 5.0–8.0)

## 2023-06-11 LAB — TSH: TSH: 2.08 m[IU]/L (ref 0.40–4.50)

## 2023-06-11 LAB — INSULIN, RANDOM: Insulin: 26.1 u[IU]/mL — ABNORMAL HIGH

## 2023-06-11 LAB — MICROALBUMIN / CREATININE URINE RATIO
Creatinine, Urine: 21 mg/dL (ref 20–275)
Microalb, Ur: <0.2 mg/dL

## 2023-06-11 LAB — LIPID PANEL
Cholesterol: 167 mg/dL (ref <200)
HDL: 54 mg/dL (ref >=50)
LDL Cholesterol (Calc): 86 mg/dL
Non-HDL Cholesterol (Calc): 113 mg/dL (ref <130)
Total CHOL/HDL Ratio: 3.1 (calc) (ref <5.0)
Triglycerides: 174 mg/dL — ABNORMAL HIGH (ref <150)

## 2023-06-11 LAB — VITAMIN D 25 HYDROXY (VIT D DEFICIENCY, FRACTURES): Vit D, 25-Hydroxy: 52 ng/mL (ref 30–100)

## 2023-06-11 LAB — MAGNESIUM: Magnesium: 2.2 mg/dL (ref 1.5–2.5)

## 2023-06-11 LAB — MICROSCOPIC MESSAGE

## 2023-06-11 NOTE — Progress Notes (Signed)
<>*<>*<>*<>*<>*<>*<>*<>*<>*<>*<>*<>*<>*<>*<>*<>*<>*<>*<>*<>*<>*<>*<>*<>*<> <>*<>*<>*<>*<>*<>*<>*<>*<>*<>*<>*<>*<>*<>*<>*<>*<>*<>*<>*<>*<>*<>*<>*<>*<>  -  Test results slightly outside the reference range are not unusual. If there is anything important, I will review this with you,  otherwise it is considered normal test values.  If you have further questions,  please do not hesitate to contact me at the office or via My Chart.   <>*<>*<>*<>*<>*<>*<>*<>*<>*<>*<>*<>*<>*<>*<>*<>*<>*<>*<>*<>*<>*<>*<>*<>*<> <>*<>*<>*<>*<>*<>*<>*<>*<>*<>*<>*<>*<>*<>*<>*<>*<>*<>*<>*<>*<>*<>*<>*<>*<>  -   Chol = 167 -  Excellent   - Very low risk for Heart Attack  / Stroke  <>*<>*<>*<>*<>*<>*<>*<>*<>*<>*<>*<>*<>*<>*<>*<>*<>*<>*<>*<>*<>*<>*<>*<>*<> <>*<>*<>*<>*<>*<>*<>*<>*<>*<>*<>*<>*<>*<>*<>*<>*<>*<>*<>*<>*<>*<>*<>*<>*<>  -  A1c - Normal - No Diabetes - Great !   <>*<>*<>*<>*<>*<>*<>*<>*<>*<>*<>*<>*<>*<>*<>*<>*<>*<>*<>*<>*<>*<>*<>*<>*<> <>*<>*<>*<>*<>*<>*<>*<>*<>*<>*<>*<>*<>*<>*<>*<>*<>*<>*<>*<>*<>*<>*<>*<>*<>  -   Vitamin D = 52 - Great - Continue dosage same   <>*<>*<>*<>*<>*<>*<>*<>*<>*<>*<>*<>*<>*<>*<>*<>*<>*<>*<>*<>*<>*<>*<>*<>*<> <>*<>*<>*<>*<>*<>*<>*<>*<>*<>*<>*<>*<>*<>*<>*<>*<>*<>*<>*<>*<>*<>*<>*<>*<>  -   All Else - CBC - Kidneys - Electrolytes - Liver - Magnesium & Thyroid    - all  Normal / OK  <>*<>*<>*<>*<>*<>*<>*<>*<>*<>*<>*<>*<>*<>*<>*<>*<>*<>*<>*<>*<>*<>*<>*<>*<> <>*<>*<>*<>*<>*<>*<>*<>*<>*<>*<>*<>*<>*<>*<>*<>*<>*<>*<>*<>*<>*<>*<>*<>*<>  -   Keep up the [place] Work  !  <>*<>*<>*<>*<>*<>*<>*<>*<>*<>*<>*<>*<>*<>*<>*<>*<>*<>*<>*<>*<>*<>*<>*<>*<> <>*<>*<>*<>*<>*<>*<>*<>*<>*<>*<>*<>*<>*<>*<>*<>*<>*<>*<>*<>*<>*<>*<>*<>*<>  -

## 2023-08-11 DIAGNOSIS — Z09 Encounter for follow-up examination after completed treatment for conditions other than malignant neoplasm: Secondary | ICD-10-CM | POA: Diagnosis not present

## 2023-08-11 DIAGNOSIS — Z860101 Personal history of adenomatous and serrated colon polyps: Secondary | ICD-10-CM | POA: Diagnosis not present

## 2023-08-27 ENCOUNTER — Other Ambulatory Visit: Payer: Self-pay | Admitting: Nurse Practitioner

## 2023-09-04 ENCOUNTER — Ambulatory Visit: Payer: Medicare Other | Admitting: Nurse Practitioner

## 2023-09-17 NOTE — Progress Notes (Signed)
 Medicare wellness and OV  Assessment:   Encounter for Medicare annual wellness exam Due Annually Health maintenance reviewed  Essential hypertension Discussed DASH (Dietary Approaches to Stop Hypertension) DASH diet is lower in sodium than a typical American diet. Cut back on foods that are high in saturated fat, cholesterol, and trans fats. Eat more whole-grain foods, fish, poultry, and nuts Remain active and exercise as tolerated daily.  Monitor BP at home-Call if greater than 130/80.  Check CMP/CBC  Hyperlipidemia, unspecified hyperlipidemia type Discussed lifestyle modifications. Recommended diet heavy in fruits and veggies, omega 3's. Decrease consumption of animal meats, cheeses, and dairy products. Remain active and exercise as tolerated. Continue to monitor. Check lipids/TSH   Prediabetes Discussed disease progression and risks Discussed diet/exercise, weight management and risk modification  Medication management All medications discussed and reviewed in full. All questions and concerns regarding medications addressed.    Gastroesophageal reflux disease, esophagitis presence not specified No suspected reflux complications (Barret/stricture). Lifestyle modification:  wt loss, avoid meals 2-3h before bedtime. Consider eliminating food triggers:  chocolate, caffeine, EtOH, acid/spicy food.  Uncomplicated asthma, unspecified asthma severity, unspecified whether persistent No recent flare Monitor, controlled  MVP (mitral valve prolapse) Controlled Monitor  Vitamin D  deficiency Continue supplement Monitor levels at CPE  BMI 23.0-23.9, adult Discussed appropriate BMI Diet modification. Physical activity. Encouraged/praised to build confidence.  Osteoporosis Pursue a combination of weight-bearing exercises and strength training. Advised on fall prevention measures including proper lighting in all rooms, removal of area rugs and floor clutter, use of walking  devices as deemed appropriate, avoidance of uneven walking surfaces. Smoking cessation and moderate alcohol consumption if applicable Consume 800 to 1000 IU of vitamin D  daily with a goal vitamin D  serum value of 30 ng/mL or higher. Aim for 1000 to 1200 mg of elemental calcium  daily through supplements and/or dietary sources.  Hair loss Monitor TSH and iron  Orders Placed This Encounter  Procedures   CBC with Differential/Platelet   COMPLETE METABOLIC PANEL WITH GFR   Lipid panel   Hemoglobin A1c   Iron, TIBC and Ferritin Panel   TSH    Notify office for further evaluation and treatment, questions or concerns if any reported s/s fail to improve.   The patient was advised to call back or seek an in-person evaluation if any symptoms worsen or if the condition fails to improve as anticipated.   Further disposition pending results of labs. Discussed med's effects and SE's.    I discussed the assessment and treatment plan with the patient. The patient was provided an opportunity to ask questions and all were answered. The patient agreed with the plan and demonstrated an understanding of the instructions.  Discussed med's effects and SE's. Screening labs and tests as requested with regular follow-up as recommended.  I provided 40 minutes of face-to-face time during this encounter including counseling, chart review, and critical decision making was preformed.   Future Appointments  Date Time Provider Department Center  12/22/2023 11:30 AM Tonita Fallow, MD [facility] None  03/25/2024 11:00 AM Laurice President, NP [facility] None  06/28/2024 11:00 AM Tonita Fallow, MD [facility] None  09/17/2024 10:00 AM Laurice President, NP [facility] None     Plan:   During the course of the visit the patient was educated and counseled about appropriate screening and preventive services including:   Pneumococcal vaccine  Influenza vaccine Td vaccine Screening electrocardiogram Screening  mammography Bone densitometry screening Colorectal cancer screening Diabetes screening Glaucoma screening Nutrition counseling   Subjective:  Isabel Oliver is a  83 y.o. WMF who presents for Medicare Annual Wellness Visit and follow up for chronic illness including HTN, chol, preDM, vitamin D  def, osteoporosis.    Overall she reports feeling well today.  Had + cologuard with subsequent colonoscopy 04/2018, benign polyp, 5 [id number]) with Dr. Celestia.  Has colonoscopy scheduled this month.  Her blood pressure has been controlled at home, today their BP is BP: (!) 140/72 BP Readings from Last 3 Encounters:  09/18/23 (!) 140/72  06/10/23 136/78  02/19/23 136/80    She does workout, and continues to walk about a mile daily. Denies shortness of breath, dizziness. She has asthma, does inhaler once every day in the AM.   She is on cholesterol medication and denies myalgias. Her cholesterol is not at goal. The cholesterol last visit was:   Lab Results  Component Value Date   CHOL 167 06/10/2023   HDL 54 06/10/2023   LDLCALC 86 06/10/2023   TRIG 174 (H) 06/10/2023   CHOLHDL 3.1 06/10/2023    Last A1C in the office was:  Lab Results  Component Value Date   HGBA1C 5.4 06/10/2023   Patient is on Vitamin D  supplement, 2000 international units she thinks..   Lab Results  Component Value Date   VD25OH 52 06/10/2023   She has history of osteoporosis, could not tolerate calcitonin or fosamax  due to her GERD.   BMI is Body mass index is 25.37 kg/m. weight is up some, contributes it to the holidays. Wt Readings from Last 3 Encounters:  09/18/23 147 lb 12.8 oz (67 kg)  06/10/23 145 lb 6.4 oz (66 kg)  02/19/23 143 lb (64.9 kg)   She take Synthroid  for treatment of hypothyroidism.  Concerned for hair loss.  She has had hx of low TSH levels.  Last TSH Lab Results  Component Value Date   TSH 2.08 06/10/2023     Names of Other Physician/Practitioners you currently use: 1.   [facility]- here for primary care 2. Dr. Octavia, eye doctor, 2024 3. Dr Maranda Pigg. In [place], dentist, last visit q 6 months 2024  Patient Care Team: Tonita Fallow, MD as PCP - General (Internal Medicine) Celestia Agent, MD (Inactive) as Consulting Physician (Gastroenterology) Okey Arch, MD (Inactive) as Consulting Physician (Obstetrics and Gynecology) Joshua Sieving, MD as Consulting Physician (Dermatology)   Medication Review Current Outpatient Medications on File Prior to Visit  Medication Sig Dispense Refill   acetaminophen  (TYLENOL ) 500 MG tablet Take by mouth.     albuterol  (VENTOLIN  HFA) 108 (90 Base) MCG/ACT inhaler Use  2 Inhalations  15 minutes  Apart  every 4 hours  as need to Rescue Asthma Attack 54 g 3   Ascorbic Acid (VITAMIN C) 500 MG CAPS Take 1 capsule by mouth daily.     aspirin 81 MG chewable tablet Chew 81 mg by mouth daily.     Calcium  Carb-Cholecalciferol 1000-800 MG-UNIT TABS Take by mouth.     cetirizine (ZYRTEC) 10 MG tablet Take 10 mg by mouth daily.     Cholecalciferol (VITAMIN D ) 2000 UNITS CAPS Take by mouth.     cyanocobalamin  500 MCG tablet Take 500 mcg by mouth daily.     diclofenac  sodium (VOLTAREN ) 1 % GEL APPLY 2 GRAMS TO AFFECTED AREA FOUR TIMES DAILY 100 g 0   Fluticasone -Umeclidin-Vilant (TRELEGY ELLIPTA ) 100-62.5-25 MCG/ACT AEPB Inhale 100 mcg into the lungs daily. 60 each 6   levothyroxine  (SYNTHROID ) 75 MCG tablet Take  1 tablet  Daily  on an empty stomach with only water for 30 minutes & no Antacid meds, Calcium  or Magnesium for 4 hours & avoid Biotin                                                                         /                                                                                  TAKE                                BY                                        MOUTH 90 tablet 3   Magnesium 500 MG TABS Take 1 tablet by mouth daily.     montelukast  (SINGULAIR ) 10 MG tablet TAKE 1  TABLET BY MOUTH DAILY FOR ALLERGIES 90 tablet 3   Multiple Vitamins-Minerals (MULTIVITAMIN PO) Take by mouth daily.     Omega-3 Fatty Acids (FISH OIL PO) Take by mouth daily.     omeprazole  (PRILOSEC) 20 MG capsule TAKE ONE CAPSULE BY MOUTH DAILY TO PREVENT INDIGESTION AND HEARTBURN 90 capsule 3   Probiotic Product (PROBIOTIC DAILY PO) Take by mouth daily.     rosuvastatin  (CRESTOR ) 20 MG tablet TAKE 1 TABLET BY MOUTH DAILY FOR CHOLESTEROL 90 tablet 3   verapamil  (CALAN -SR) 120 MG CR tablet TAKE 1 TABLET BY MOUTH AT BEDTIME FOR BLOOD PRESSURE 90 tablet 1   zinc gluconate 50 MG tablet Take 50 mg by mouth daily. Takes 100mg      No current facility-administered medications on file prior to visit.    Current Problems (verified) Patient Active Problem List   Diagnosis Date Noted   Palpitations 02/19/2023   Hyperthyroidism 06/09/2019   Encounter for Medicare annual wellness exam 03/29/2015   Medication management 12/05/2014   Vitamin D  deficiency 12/05/2014   Other abnormal glucose    MVP (mitral valve prolapse)    Hypertension    Hyperlipidemia    GERD (gastroesophageal reflux disease)    Asthma     Screening Tests Immunization History  Administered Date(s) Administered   Fluad Quad(high Dose 65+) 06/12/2022   Influenza, High Dose Seasonal PF 07/14/2014, 05/21/2016, 06/20/2017, 05/19/2019, 06/06/2020, 06/05/2021, 06/12/2022   Influenza-Unspecified 06/28/2013, 07/06/2015, 06/09/2018   Moderna Covid-19 Vaccine Bivalent Booster 12yrs & up 06/05/2021   Moderna SARS-COV2 Booster Vaccination 10/19/2019, 07/12/2020, 07/12/2020, 06/05/2021   Moderna Sars-Covid-2 Vaccination 09/30/2019, 10/19/2019   PNEUMOCOCCAL CONJUGATE-20 08/30/2021   Pfizer Covid-19 Vaccine Bivalent Booster 51yrs & up 06/12/2022   Pfizer(Comirnaty)Fall Seasonal Vaccine 12 years and older 06/12/2022   Pneumococcal Conjugate-13 02/01/2014   Pneumococcal-Unspecified 09/10/2007   Tdap 12/05/2009, 08/30/2020  Zoster,  Live 09/09/2005   Preventative care: Last colonoscopy: 04/2018 due 2024 Last mammogram: 04/2023 negative Last pap smear/pelvic exam: 2011 remote DEXA: 02/2023 osteoporosis- on D 3 and calcium  Due  Prior vaccinations: TD or Tdap: 2021  Influenza: 06/2022  Pneumococcal 20: 08/30/21 RSV:  06/2022 Prevnar 13: 2015, 06/2022 Shingles/Zostavax: 2007- suggest new vaccine  Allergies Allergies  Allergen Reactions   Adhesive [Tape] Hives   Codeine Hives   Fosamax  [Alendronate  Sodium] Other (See Comments)    Reflux increase   Penicillins Hives   Shellfish Allergy    Iodine Rash    SURGICAL HISTORY She  has a past surgical history that includes Appendectomy; Cholecystectomy; Eye surgery; and Tonsillectomy and adenoidectomy. FAMILY HISTORY Her family history includes Diabetes in her father; Heart disease in her father; Hyperlipidemia in her brother and sister; Hypertension in her mother. SOCIAL HISTORY She  reports that she has never smoked. She has never used smokeless tobacco. She reports that she does not drink alcohol and does not use drugs.  MEDICARE WELLNESS OBJECTIVES: Physical activity: Current Exercise Habits: Home exercise routine Cardiac risk factors:   Depression/mood screen:      09/18/2023   11:56 AM  Depression screen PHQ 2/9  Decreased Interest 0  Down, Depressed, Hopeless 0  PHQ - 2 Score 0    ADLs:     09/18/2023   11:54 AM 06/11/2023   12:01 AM  In your present state of health, do you have any difficulty performing the following activities:  Hearing? 0   Vision? 0 0  Difficulty concentrating or making decisions? 0 0  Walking or climbing stairs? 0 0  Dressing or bathing? 0 0  Doing errands, shopping? 0 0     Cognitive Testing  Alert? Yes  Normal Appearance?Yes  Oriented to person? Yes  Place? Yes   Time? Yes  Recall of three objects?  Yes  Can perform simple calculations? Yes  Displays appropriate judgment?Yes  Can read the correct time from a watch  face?Yes  EOL planning:     Objective:     Blood pressure (!) 140/72, pulse 64, temperature 97.9 F (36.6 C), height 5' 4 (1.626 m), weight 147 lb 12.8 oz (67 kg), SpO2 98%. Body mass index is 25.37 kg/m.  General appearance: alert, no distress, WD/WN,  female HEENT: normocephalic, sclerae anicteric, TMs pearly, nares patent, no discharge or erythema, pharynx normal Oral cavity: MMM, no lesions Neck: supple, no lymphadenopathy, no thyromegaly, no masses Heart: RRR, normal S1, S2, no murmurs Lungs: CTA bilaterally, no wheezes, rhonchi, or rales Abdomen: +bs, soft, non tender, non distended, no masses, no hepatomegaly, no splenomegaly Musculoskeletal:  no swelling, no obvious deformity. Maximal tenderness over greater trochanter left hip Extremities: no edema, no cyanosis, no clubbing Pulses: 2+ symmetric, upper and lower extremities, normal cap refill Neurological: alert, oriented x 3, CN2-12 intact, strength normal upper extremities and lower extremities, sensation normal throughout, DTRs 2+ throughout, no cerebellar signs, gait normal Psychiatric: normal affect, behavior normal, pleasant  Breast: defer Gyn: defer Rectal: defer  Left greater trochanter was prepped with betadine. A trigger point injection was performed at the site of maximal tenderness of left greater trochanter bursa using 1% plain Lidocaine and dexamethasone. This was well tolerated, and followed by moderate relief of pain.      Medicare Attestation I have personally reviewed: The patient's medical and social history Their use of alcohol, tobacco or illicit drugs Their current medications and supplements The patient's functional ability including ADLs,fall risks,  home safety risks, cognitive, and hearing and visual impairment Diet and physical activities Evidence for depression or mood disorders  The patient's weight, height, BMI, and visual acuity have been recorded in the chart.  I have made referrals,  counseling, and provided education to the patient based on review of the above and I have provided the patient with a written personalized care plan for preventive services.     [facility], NP   09/18/2023

## 2023-09-18 ENCOUNTER — Ambulatory Visit (INDEPENDENT_AMBULATORY_CARE_PROVIDER_SITE_OTHER): Payer: Medicare Other | Admitting: Nurse Practitioner

## 2023-09-18 ENCOUNTER — Encounter: Payer: Self-pay | Admitting: Nurse Practitioner

## 2023-09-18 VITALS — BP 140/72 | HR 64 | Temp 97.9°F | Ht 64.0 in | Wt 147.8 lb

## 2023-09-18 DIAGNOSIS — Z79899 Other long term (current) drug therapy: Secondary | ICD-10-CM | POA: Diagnosis not present

## 2023-09-18 DIAGNOSIS — R7309 Other abnormal glucose: Secondary | ICD-10-CM

## 2023-09-18 DIAGNOSIS — L659 Nonscarring hair loss, unspecified: Secondary | ICD-10-CM

## 2023-09-18 DIAGNOSIS — K219 Gastro-esophageal reflux disease without esophagitis: Secondary | ICD-10-CM | POA: Diagnosis not present

## 2023-09-18 DIAGNOSIS — E039 Hypothyroidism, unspecified: Secondary | ICD-10-CM | POA: Diagnosis not present

## 2023-09-18 DIAGNOSIS — E782 Mixed hyperlipidemia: Secondary | ICD-10-CM | POA: Diagnosis not present

## 2023-09-18 DIAGNOSIS — J45909 Unspecified asthma, uncomplicated: Secondary | ICD-10-CM

## 2023-09-18 DIAGNOSIS — M810 Age-related osteoporosis without current pathological fracture: Secondary | ICD-10-CM

## 2023-09-18 DIAGNOSIS — Z0001 Encounter for general adult medical examination with abnormal findings: Secondary | ICD-10-CM

## 2023-09-18 DIAGNOSIS — I10 Essential (primary) hypertension: Secondary | ICD-10-CM | POA: Diagnosis not present

## 2023-09-18 DIAGNOSIS — Z0000 Encounter for general adult medical examination without abnormal findings: Secondary | ICD-10-CM

## 2023-09-18 DIAGNOSIS — R6889 Other general symptoms and signs: Secondary | ICD-10-CM

## 2023-09-18 DIAGNOSIS — E559 Vitamin D deficiency, unspecified: Secondary | ICD-10-CM

## 2023-09-18 DIAGNOSIS — I341 Nonrheumatic mitral (valve) prolapse: Secondary | ICD-10-CM

## 2023-09-18 DIAGNOSIS — E663 Overweight: Secondary | ICD-10-CM

## 2023-09-18 NOTE — Patient Instructions (Signed)

## 2023-09-19 ENCOUNTER — Encounter: Payer: Self-pay | Admitting: Nurse Practitioner

## 2023-09-20 LAB — TEST AUTHORIZATION

## 2023-09-20 LAB — HEMOGLOBIN A1C
Hgb A1c MFr Bld: 5.4 %{Hb} (ref <5.7)
Mean Plasma Glucose: 108 mg/dL
eAG (mmol/L): 6 mmol/L

## 2023-09-20 LAB — IRON,TIBC AND FERRITIN PANEL
%SAT: 32 % (ref 16–45)
Ferritin: 28 ng/mL (ref 16–288)
Iron: 98 ug/dL (ref 45–160)
TIBC: 311 ug/dL (ref 250–450)

## 2023-09-20 LAB — CBC WITH DIFFERENTIAL/PLATELET
Absolute Lymphocytes: 1979 {cells}/uL (ref 850–3900)
Absolute Monocytes: 662 {cells}/uL (ref 200–950)
Basophils Absolute: 62 {cells}/uL (ref 0–200)
Basophils Relative: 0.8 %
Eosinophils Absolute: 139 {cells}/uL (ref 15–500)
Eosinophils Relative: 1.8 %
HCT: 43.7 % (ref 35.0–45.0)
Hemoglobin: 14.1 g/dL (ref 11.7–15.5)
MCH: 31.1 pg (ref 27.0–33.0)
MCHC: 32.3 g/dL (ref 32.0–36.0)
MCV: 96.3 fL (ref 80.0–100.0)
MPV: 9.4 fL (ref 7.5–12.5)
Monocytes Relative: 8.6 %
Neutro Abs: 4859 {cells}/uL (ref 1500–7800)
Neutrophils Relative %: 63.1 %
Platelets: 309 10*3/uL (ref 140–400)
RBC: 4.54 10*6/uL (ref 3.80–5.10)
RDW: 11.8 % (ref 11.0–15.0)
Total Lymphocyte: 25.7 %
WBC: 7.7 10*3/uL (ref 3.8–10.8)

## 2023-09-20 LAB — COMPLETE METABOLIC PANEL WITH GFR
AG Ratio: 2 (calc) (ref 1.0–2.5)
ALT: 17 U/L (ref 6–29)
AST: 20 U/L (ref 10–35)
Albumin: 4.5 g/dL (ref 3.6–5.1)
Alkaline phosphatase (APISO): 62 U/L (ref 37–153)
BUN: 15 mg/dL (ref 7–25)
CO2: 27 mmol/L (ref 20–32)
Calcium: 9.9 mg/dL (ref 8.6–10.4)
Chloride: 105 mmol/L (ref 98–110)
Creat: 0.68 mg/dL (ref 0.60–0.95)
Globulin: 2.3 g/dL (ref 1.9–3.7)
Glucose, Bld: 78 mg/dL (ref 65–99)
Potassium: 4.5 mmol/L (ref 3.5–5.3)
Sodium: 140 mmol/L (ref 135–146)
Total Bilirubin: 0.4 mg/dL (ref 0.2–1.2)
Total Protein: 6.8 g/dL (ref 6.1–8.1)
eGFR: 87 mL/min/{1.73_m2} (ref >=60)

## 2023-09-20 LAB — LIPID PANEL
Cholesterol: 161 mg/dL (ref <200)
HDL: 56 mg/dL (ref >=50)
LDL Cholesterol (Calc): 81 mg/dL
Non-HDL Cholesterol (Calc): 105 mg/dL (ref <130)
Total CHOL/HDL Ratio: 2.9 (calc) (ref <5.0)
Triglycerides: 140 mg/dL (ref <150)

## 2023-09-20 LAB — TSH: TSH: 4.5 m[IU]/L (ref 0.40–4.50)

## 2023-09-23 DIAGNOSIS — D124 Benign neoplasm of descending colon: Secondary | ICD-10-CM | POA: Diagnosis not present

## 2023-09-23 DIAGNOSIS — K5730 Diverticulosis of large intestine without perforation or abscess without bleeding: Secondary | ICD-10-CM | POA: Diagnosis not present

## 2023-09-23 DIAGNOSIS — Z860101 Personal history of adenomatous and serrated colon polyps: Secondary | ICD-10-CM | POA: Diagnosis not present

## 2023-09-23 DIAGNOSIS — Z09 Encounter for follow-up examination after completed treatment for conditions other than malignant neoplasm: Secondary | ICD-10-CM | POA: Diagnosis not present

## 2023-11-10 DIAGNOSIS — K080 Exfoliation of teeth due to systemic causes: Secondary | ICD-10-CM | POA: Diagnosis not present

## 2023-11-11 ENCOUNTER — Other Ambulatory Visit: Payer: Self-pay

## 2023-11-11 DIAGNOSIS — E039 Hypothyroidism, unspecified: Secondary | ICD-10-CM

## 2023-11-11 MED ORDER — LEVOTHYROXINE SODIUM 75 MCG PO TABS: ORAL_TABLET | ORAL | 0 refills | Status: DC

## 2023-12-10 ENCOUNTER — Other Ambulatory Visit: Payer: Self-pay | Admitting: Family Medicine

## 2023-12-10 DIAGNOSIS — K219 Gastro-esophageal reflux disease without esophagitis: Secondary | ICD-10-CM

## 2023-12-10 DIAGNOSIS — E782 Mixed hyperlipidemia: Secondary | ICD-10-CM

## 2023-12-10 NOTE — Telephone Encounter (Signed)
 Copied from CRM [id number]. Topic: Clinical - Medication Refill >> Dec 10, 2023 11:48 AM Carrielelia G wrote: Most Recent Primary Care Visit:   Medication:  omeprazole (PRILOSEC) 20 MG capsule verapamil (CALAN-SR) 120 MG CR tablet rosuvastatin (CRESTOR) 20 MG tablet albuterol (VENTOLIN HFA) 108 (90 Base) MCG/ACT inhaler    Has the patient contacted their pharmacy? Yes (Agent: If no, request that the patient contact the pharmacy for the refill. If patient does not wish to contact the pharmacy document the reason why and proceed with request.) (Agent: If yes, when and what did the pharmacy advise?)  Is this the correct pharmacy for this prescription? Yes If no, delete pharmacy and type the correct one.  This is the patient's preferred pharmacy:   [id number] - [place], [place] - [address] AT [facility] [address] [place] [address] Phone: [phone] Fax: [phone]       Is the patient out of the medication? Yes  Has the patient been seen for an appointment in the last year OR does the patient have an upcoming appointment? Yes  Can we respond through MyChart? Yes  Agent: Please be advised that Rx refills may take up to 3 business days. We ask that you follow-up with your pharmacy.

## 2023-12-10 NOTE — Telephone Encounter (Signed)
 Called [facility] CAL line and spoke with clinic staff, she states the nurse advised patient needs to go to urgent care for medication refills until she is seen and established with them. New patient visit is not scheduled until 02/10/2024. Cancelled reorder and closing encounter.

## 2023-12-22 ENCOUNTER — Ambulatory Visit: Payer: Medicare Other | Admitting: Internal Medicine

## 2024-02-09 ENCOUNTER — Other Ambulatory Visit: Payer: Self-pay | Admitting: Family

## 2024-02-09 DIAGNOSIS — E039 Hypothyroidism, unspecified: Secondary | ICD-10-CM

## 2024-02-10 ENCOUNTER — Ambulatory Visit: Payer: Medicare Other | Admitting: Family Medicine

## 2024-02-11 ENCOUNTER — Encounter: Payer: Self-pay | Admitting: Family Medicine

## 2024-02-11 ENCOUNTER — Ambulatory Visit (INDEPENDENT_AMBULATORY_CARE_PROVIDER_SITE_OTHER): Admitting: Family Medicine

## 2024-02-11 VITALS — BP 130/78 | HR 76 | Temp 98.0°F | Ht 64.0 in | Wt 149.0 lb

## 2024-02-11 DIAGNOSIS — I10 Essential (primary) hypertension: Secondary | ICD-10-CM

## 2024-02-11 DIAGNOSIS — J45909 Unspecified asthma, uncomplicated: Secondary | ICD-10-CM

## 2024-02-11 DIAGNOSIS — E782 Mixed hyperlipidemia: Secondary | ICD-10-CM

## 2024-02-11 DIAGNOSIS — E039 Hypothyroidism, unspecified: Secondary | ICD-10-CM | POA: Insufficient documentation

## 2024-02-11 DIAGNOSIS — Z7689 Persons encountering health services in other specified circumstances: Secondary | ICD-10-CM

## 2024-02-11 DIAGNOSIS — R300 Dysuria: Secondary | ICD-10-CM | POA: Diagnosis not present

## 2024-02-11 DIAGNOSIS — K219 Gastro-esophageal reflux disease without esophagitis: Secondary | ICD-10-CM

## 2024-02-11 DIAGNOSIS — J302 Other seasonal allergic rhinitis: Secondary | ICD-10-CM | POA: Insufficient documentation

## 2024-02-11 MED ORDER — VERAPAMIL HCL ER 120 MG PO TBCR: EXTENDED_RELEASE_TABLET | ORAL | 0 refills | Status: DC

## 2024-02-11 MED ORDER — MONTELUKAST SODIUM 10 MG PO TABS: ORAL_TABLET | ORAL | 0 refills | Status: DC

## 2024-02-11 MED ORDER — ALBUTEROL SULFATE HFA 108 (90 BASE) MCG/ACT IN AERS: INHALATION_SPRAY | RESPIRATORY_TRACT | 1 refills | Status: AC

## 2024-02-11 MED ORDER — OMEPRAZOLE 20 MG PO CPDR: DELAYED_RELEASE_CAPSULE | ORAL | 0 refills | Status: DC

## 2024-02-11 MED ORDER — ROSUVASTATIN CALCIUM 20 MG PO TABS: ORAL_TABLET | ORAL | 0 refills | Status: DC

## 2024-02-11 MED ORDER — LEVOTHYROXINE SODIUM 75 MCG PO TABS
ORAL_TABLET | ORAL | 0 refills | Status: DC
Start: 2024-02-11 — End: 2024-05-17

## 2024-02-11 NOTE — Assessment & Plan Note (Signed)
 Taking levothyroxine  75 mcg daily before breakfast. Reports some hair loss. Check thyroid  function today. Follow-up in 3 months, sooner if medication needs adjustment. Refill sent.

## 2024-02-11 NOTE — Assessment & Plan Note (Signed)
 Burning and urgency. No fever or flank pain. U/a today.

## 2024-02-11 NOTE — Assessment & Plan Note (Signed)
 Taking rosuvastatin  20 mg daily. No side effects reported. Lipid panel normal in January. Defer until next follow-up. Refill sent.

## 2024-02-11 NOTE — Assessment & Plan Note (Signed)
 Symptoms are controlled with albuterol . Continue to monitor and start maintenance inhaler is symptoms worsen.

## 2024-02-11 NOTE — Assessment & Plan Note (Signed)
 Managed with OTC allergy medication.

## 2024-02-11 NOTE — Assessment & Plan Note (Signed)
 Taking verapamil  SR 120 mg daily. Well controlled in office and with home readings. No chest pain or shortness of breath. DASH diet and moderate exercise. Refills sent. Follow-up in 3 months.

## 2024-02-11 NOTE — Assessment & Plan Note (Signed)
 Omeprazole  20 mg daily with good symptom control. Refill sent.

## 2024-02-11 NOTE — Progress Notes (Signed)
 New Patient Office Visit  Subjective    Patient ID: Isabel Oliver, female    DOB: 01-29-1941  Age: 83 y.o. MRN: [id number]  CC:  Chief Complaint  Patient presents with   Establish Care    Medication refills     HPI Isabel Oliver presents to establish care with this practice. She is new to me.  Transferring care after PCP passed away.    Hypothyroid: Taking levothyroxine  75 mcg daily. Reports hair loss.  Check thyroid  function today.   HTN: monitors at home: 128/70 No chest pain, shortness of breath.  Verapamil  SR 120 mg daily   HLD: rosuvastatin  20 mg daily  Tolerates well. Lipid panel normal in January.   Asthma: Albuterol  inhaler 2 puffs in morning. Symptoms are controlled. Was told to do this by pulmonary in the past. Montelukast  10 mg daily.  Discussed maintenance, does not want to change.   Urinary burning and urgency. Strong odor. Clear.  Wants u/a today. No UTI in past 2 years. No fever. No flank pain.   GERD:  Omeprazole  20 mg daily. Has tried to come off and did not tolerate.      Chart review:  Labs done 09/18/23:  TSH: normal Iron : normal A1C: 5.4 Lipid: LDL 81  CMP: GFR 87 with normal. CBC: normal       Outpatient Encounter Medications as of 02/11/2024  Medication Sig   acetaminophen  (TYLENOL ) 500 MG tablet Take by mouth.   Ascorbic Acid (VITAMIN C) 500 MG CAPS Take 1 capsule by mouth daily.   aspirin 81 MG chewable tablet Chew 81 mg by mouth daily.   Calcium  Carb-Cholecalciferol 1000-800 MG-UNIT TABS Take by mouth.   cetirizine (ZYRTEC) 10 MG tablet Take 10 mg by mouth daily.   Cholecalciferol (VITAMIN D ) 2000 UNITS CAPS Take by mouth.   cyanocobalamin  500 MCG tablet Take 500 mcg by mouth daily.   diclofenac  sodium (VOLTAREN ) 1 % GEL APPLY 2 GRAMS TO AFFECTED AREA FOUR TIMES DAILY   Lysine 1000 MG TABS Take by mouth.   Magnesium 500 MG TABS Take 1 tablet by mouth daily.   Multiple Vitamins-Minerals  (MULTIVITAMIN PO) Take by mouth daily.   Probiotic Product (PROBIOTIC DAILY PO) Take by mouth daily.   zinc gluconate 50 MG tablet Take 50 mg by mouth daily. Takes 100mg    [DISCONTINUED] albuterol  (VENTOLIN  HFA) 108 (90 Base) MCG/ACT inhaler Use  2 Inhalations  15 minutes  Apart  every 4 hours  as need to Rescue Asthma Attack   [DISCONTINUED] levothyroxine  (SYNTHROID ) 75 MCG tablet Take  1 tablet  Daily  on an empty stomach with only water for 30 minutes & no Antacid meds, Calcium  or Magnesium for 4 hours & avoid Biotin                                                                         /  TAKE                                BY                                        MOUTH   [DISCONTINUED] montelukast  (SINGULAIR ) 10 MG tablet TAKE 1 TABLET BY MOUTH DAILY FOR ALLERGIES   [DISCONTINUED] omeprazole  (PRILOSEC) 20 MG capsule TAKE ONE CAPSULE BY MOUTH DAILY TO PREVENT INDIGESTION AND HEARTBURN   [DISCONTINUED] rosuvastatin  (CRESTOR ) 20 MG tablet TAKE 1 TABLET BY MOUTH DAILY FOR CHOLESTEROL   [DISCONTINUED] verapamil  (CALAN -SR) 120 MG CR tablet TAKE 1 TABLET BY MOUTH AT BEDTIME FOR BLOOD PRESSURE   albuterol  (VENTOLIN  HFA) 108 (90 Base) MCG/ACT inhaler Use  2 Inhalations  15 minutes  Apart  every 4 hours  as need to Rescue Asthma Attack   levothyroxine  (SYNTHROID ) 75 MCG tablet Take one tablet by mouth 30-60 minutes before breakfast.   montelukast  (SINGULAIR ) 10 MG tablet TAKE 1 TABLET BY MOUTH DAILY FOR ALLERGIES   omeprazole  (PRILOSEC) 20 MG capsule TAKE ONE CAPSULE BY MOUTH DAILY TO PREVENT INDIGESTION AND HEARTBURN   rosuvastatin  (CRESTOR ) 20 MG tablet TAKE 1 TABLET BY MOUTH DAILY FOR CHOLESTEROL   verapamil  (CALAN -SR) 120 MG CR tablet TAKE 1 TABLET BY MOUTH AT BEDTIME FOR BLOOD PRESSURE   [DISCONTINUED] Fluticasone -Umeclidin-Vilant (TRELEGY ELLIPTA ) 100-62.5-25 MCG/ACT AEPB Inhale 100 mcg into the lungs daily.    [DISCONTINUED] Omega-3 Fatty Acids (FISH OIL PO) Take by mouth daily.   No facility-administered encounter medications on file as of 02/11/2024.    Past Medical History:  Diagnosis Date   Anemia    Asthma    GERD (gastroesophageal reflux disease)    Goiter    Hyperlipidemia    Hypertension    MVP (mitral valve prolapse)    Osteopenia    Other abnormal glucose    Positive colorectal cancer screening using Cologuard test 08/11/2017    Past Surgical History:  Procedure Laterality Date   APPENDECTOMY     CHOLECYSTECTOMY     EYE SURGERY     TONSILLECTOMY AND ADENOIDECTOMY      Family History  Problem Relation Age of Onset   Hypertension Mother    Diabetes Father    Heart disease Father    Hyperlipidemia Sister    Hyperlipidemia Brother    Breast cancer Neg Hx     Social History   Socioeconomic History   Marital status: Widowed    Spouse name: Not on file   Number of children: Not on file   Years of education: Not on file   Highest education level: Associate degree: occupational, Scientist, product/process development, or vocational program  Occupational History   Not on file  Tobacco Use   Smoking status: Never   Smokeless tobacco: Never  Substance and Sexual Activity   Alcohol use: No   Drug use: No   Sexual activity: Not on file  Other Topics Concern   Not on file  Social History Narrative   Not on file   Social Drivers of Health   Financial Resource Strain: Medium Risk (02/07/2024)   Overall Financial Resource Strain (CARDIA)    Difficulty of Paying Living Expenses: Somewhat hard  Food Insecurity: No Food Insecurity (02/11/2024)   Hunger Vital Sign    Worried About Running Out of Food in  the Last Year: Never true    Ran Out of Food in the Last Year: Never true  Transportation Needs: No Transportation Needs (02/11/2024)   PRAPARE - Administrator, Civil Service (Medical): No    Lack of Transportation (Non-Medical): No  Physical Activity: Sufficiently Active (02/07/2024)    Exercise Vital Sign    Days of Exercise per Week: 5 days    Minutes of Exercise per Session: 30 min  Stress: No Stress Concern Present (02/07/2024)   [employer] of Occupational Health - Occupational Stress Questionnaire    Feeling of Stress : Not at all  Social Connections: Moderately Integrated (02/11/2024)   Social Connection and Isolation Panel [NHANES]    Frequency of Communication with Friends and Family: More than three times a week    Frequency of Social Gatherings with Friends and Family: More than three times a week    Attends Religious Services: More than 4 times per year    Active Member of [employer] or Organizations: Yes    Attends Banker Meetings: More than 4 times per year    Marital Status: Widowed  Intimate Partner Violence: Not At Risk (02/11/2024)   Humiliation, Afraid, Rape, and Kick questionnaire    Fear of Current or Ex-Partner: No    Emotionally Abused: No    Physically Abused: No    Sexually Abused: No    Review of Systems  Respiratory:  Negative for shortness of breath.   Cardiovascular:  Negative for chest pain.       Objective    BP 130/78   Pulse 76   Temp 98 F (36.7 C) (Oral)   Ht 5\' 4"  (1.626 m)   Wt 149 lb (67.6 kg)   SpO2 96%   BMI 25.58 kg/m   Physical Exam Vitals and nursing note reviewed.  Constitutional:      General: She is not in acute distress.    Appearance: Normal appearance. She is normal weight.  Cardiovascular:     Rate and Rhythm: Regular rhythm.     Heart sounds: Normal heart sounds.  Pulmonary:     Effort: Pulmonary effort is normal.     Breath sounds: Normal breath sounds.  Skin:    General: Skin is warm and dry.  Neurological:     General: No focal deficit present.     Mental Status: She is alert. Mental status is at baseline.  Psychiatric:        Mood and Affect: Mood normal.        Behavior: Behavior normal.        Thought Content: Thought content normal.        Judgment: Judgment normal.        Assessment & Plan:   Problem List Items Addressed This Visit     Hypertension   Taking verapamil  SR 120 mg daily. Well controlled in office and with home readings. No chest pain or shortness of breath. DASH diet and moderate exercise. Refills sent. Follow-up in 3 months.       Relevant Medications   rosuvastatin  (CRESTOR ) 20 MG tablet   verapamil  (CALAN -SR) 120 MG CR tablet   Other Relevant Orders   Comprehensive metabolic panel with GFR   Hyperlipidemia, mixed   Taking rosuvastatin  20 mg daily. No side effects reported. Lipid panel normal in January. Defer until next follow-up. Refill sent.       Relevant Medications   rosuvastatin  (CRESTOR ) 20 MG tablet   verapamil  (CALAN -SR) 120  MG CR tablet   Gastroesophageal reflux disease   Omeprazole  20 mg daily with good symptom control. Refill sent.        Relevant Medications   omeprazole  (PRILOSEC) 20 MG capsule   Asthma   Symptoms are controlled with albuterol . Continue to monitor and start maintenance inhaler is symptoms worsen.       Relevant Medications   albuterol  (VENTOLIN  HFA) 108 (90 Base) MCG/ACT inhaler   montelukast  (SINGULAIR ) 10 MG tablet   Establishing care with new doctor, encounter for - Primary   Hypothyroidism   Taking levothyroxine  75 mcg daily before breakfast. Reports some hair loss. Check thyroid  function today. Follow-up in 3 months, sooner if medication needs adjustment. Refill sent.       Relevant Medications   levothyroxine  (SYNTHROID ) 75 MCG tablet   Other Relevant Orders   TSH+T4F+T3Free   Dysuria   Burning and urgency. No fever or flank pain. U/a today.       Relevant Orders   Urinalysis, Routine w reflex microscopic   Seasonal allergies   Managed with OTC allergy medication.       Relevant Medications   montelukast  (SINGULAIR ) 10 MG tablet  Agrees with plan of care discussed.  Questions answered.   Return in about 3 months (around 05/13/2024) for HTN,  Hypothyroid, HLD, asthma, .   Mickiel Albany, FNP

## 2024-02-12 ENCOUNTER — Ambulatory Visit: Payer: Self-pay | Admitting: Family Medicine

## 2024-02-12 LAB — COMPREHENSIVE METABOLIC PANEL WITH GFR
ALT: 19 IU/L (ref 0–32)
AST: 23 IU/L (ref 0–40)
Albumin: 4.6 g/dL (ref 3.7–4.7)
Alkaline Phosphatase: 71 IU/L (ref 44–121)
BUN/Creatinine Ratio: 17 (ref 12–28)
BUN: 13 mg/dL (ref 8–27)
Bilirubin Total: 0.3 mg/dL (ref 0.0–1.2)
CO2: 20 mmol/L (ref 20–29)
Calcium: 10.1 mg/dL (ref 8.7–10.3)
Chloride: 103 mmol/L (ref 96–106)
Creatinine, Ser: 0.75 mg/dL (ref 0.57–1.00)
Globulin, Total: 2.3 g/dL (ref 1.5–4.5)
Glucose: 91 mg/dL (ref 70–99)
Potassium: 4.6 mmol/L (ref 3.5–5.2)
Sodium: 141 mmol/L (ref 134–144)
Total Protein: 6.9 g/dL (ref 6.0–8.5)
eGFR: 80 mL/min/{1.73_m2} (ref >59)

## 2024-02-12 LAB — URINALYSIS, ROUTINE W REFLEX MICROSCOPIC
Bilirubin, UA: NEGATIVE
Glucose, UA: NEGATIVE
Ketones, UA: NEGATIVE
Nitrite, UA: NEGATIVE
Protein,UA: NEGATIVE
RBC, UA: NEGATIVE
Specific Gravity, UA: 1.007 (ref 1.005–1.030)
Urobilinogen, Ur: 0.2 mg/dL (ref 0.2–1.0)
pH, UA: 6.5 (ref 5.0–7.5)

## 2024-02-12 LAB — TSH+T4F+T3FREE
Free T4: 1.57 ng/dL (ref 0.82–1.77)
T3, Free: 2.8 pg/mL (ref 2.0–4.4)
TSH: 2.95 u[IU]/mL (ref 0.450–4.500)

## 2024-02-12 LAB — MICROSCOPIC EXAMINATION
Bacteria, UA: NONE SEEN
Casts: NONE SEEN /LPF
Epithelial Cells (non renal): NONE SEEN /HPF (ref 0–10)
RBC, Urine: NONE SEEN /HPF (ref 0–2)

## 2024-03-25 ENCOUNTER — Ambulatory Visit: Payer: Medicare Other | Admitting: Nurse Practitioner

## 2024-04-29 ENCOUNTER — Encounter: Payer: Medicare Other | Admitting: Internal Medicine

## 2024-05-08 ENCOUNTER — Other Ambulatory Visit: Payer: Self-pay | Admitting: Family Medicine

## 2024-05-08 DIAGNOSIS — J45909 Unspecified asthma, uncomplicated: Secondary | ICD-10-CM

## 2024-05-08 DIAGNOSIS — E782 Mixed hyperlipidemia: Secondary | ICD-10-CM

## 2024-05-08 DIAGNOSIS — I10 Essential (primary) hypertension: Secondary | ICD-10-CM

## 2024-05-08 DIAGNOSIS — J302 Other seasonal allergic rhinitis: Secondary | ICD-10-CM

## 2024-05-08 DIAGNOSIS — K219 Gastro-esophageal reflux disease without esophagitis: Secondary | ICD-10-CM

## 2024-05-13 ENCOUNTER — Ambulatory Visit: Admitting: Family Medicine

## 2024-05-16 ENCOUNTER — Other Ambulatory Visit: Payer: Self-pay | Admitting: Family Medicine

## 2024-05-16 DIAGNOSIS — E039 Hypothyroidism, unspecified: Secondary | ICD-10-CM

## 2024-05-18 ENCOUNTER — Encounter: Payer: Self-pay | Admitting: Family Medicine

## 2024-05-18 ENCOUNTER — Ambulatory Visit (INDEPENDENT_AMBULATORY_CARE_PROVIDER_SITE_OTHER): Admitting: Family Medicine

## 2024-05-18 VITALS — BP 136/74 | HR 69 | Temp 97.7°F | Ht 64.0 in | Wt 146.0 lb

## 2024-05-18 DIAGNOSIS — Z23 Encounter for immunization: Secondary | ICD-10-CM | POA: Diagnosis not present

## 2024-05-18 DIAGNOSIS — E039 Hypothyroidism, unspecified: Secondary | ICD-10-CM | POA: Diagnosis not present

## 2024-05-18 DIAGNOSIS — I10 Essential (primary) hypertension: Secondary | ICD-10-CM | POA: Diagnosis not present

## 2024-05-18 DIAGNOSIS — K219 Gastro-esophageal reflux disease without esophagitis: Secondary | ICD-10-CM

## 2024-05-18 DIAGNOSIS — E782 Mixed hyperlipidemia: Secondary | ICD-10-CM

## 2024-05-18 DIAGNOSIS — R300 Dysuria: Secondary | ICD-10-CM

## 2024-05-18 MED ORDER — COVID-19 MRNA VAC-TRIS(PFIZER) 30 MCG/0.3ML IM SUSY: 0.3000 mL | PREFILLED_SYRINGE | Freq: Once | INTRAMUSCULAR | 0 refills | Status: AC

## 2024-05-18 NOTE — Assessment & Plan Note (Addendum)
 Taking verapamil  SR 120 mg daily No  chest pain or shortness of breath, no headaches or vision changes.  134/80 at home  Well controlled upon recheck today. BMP today.  No refill needed.  Follow-up in 3 months.

## 2024-05-18 NOTE — Assessment & Plan Note (Signed)
 Taking rosuvastatin  20 mg daily  Tolerates well. No muscle aches.  No refill needed today. Lipid panel.

## 2024-05-18 NOTE — Assessment & Plan Note (Signed)
 Taking levothyroxine  75 mcg daily Symptoms are well controlled. Thyroid  function today.  No refills needed.

## 2024-05-18 NOTE — Progress Notes (Signed)
 Established Patient Office Visit  Subjective   Patient ID: Isabel Oliver, female    DOB: [birth year]  Age: 83 y.o. MRN: [id number]  Chief Complaint  Patient presents with   Follow-up    HTN, Hypothyroid, HLD, Asthma Wants to get the flu shot today. And asked about the COVID shot. Was told she needed a prescription    HPI Hypothyoid: levothyroxine  75 mcg daily Allergies/asthma: montelukast  10 mg daily, albuterol  2 puffs am  GERD: omeprazole  20 mg daily Symptoms are well controlled. HLD: rosuvastatin  20 mg daily  Tolerates well. No muscle aches.  HTN: verapamil  SR 120 mg daily No  chest pain or shortness of breath, no headaches or vision changes.  134/80 at home  Refills sent previously.   Covid vaccine sent to [facility]. Flu vaccine in the office today. Labs today: BMP, Lipid panel. Thyroid  function.    ROS    Objective:     BP 136/74 (Patient Position: Sitting, Cuff Size: Normal)   Pulse 69   Temp 97.7 F (36.5 C) (Oral)   Ht 5' 4 (1.626 m)   Wt 146 lb (66.2 kg)   SpO2 95%   BMI 25.06 kg/m  BP Readings from Last 3 Encounters:  05/18/24 136/74  02/11/24 130/78  09/18/23 (!) 140/72      Physical Exam Vitals and nursing note reviewed.  Constitutional:      General: She is not in acute distress.    Appearance: Normal appearance.  Cardiovascular:     Rate and Rhythm: Normal rate and regular rhythm.     Heart sounds: Murmur heard.  Pulmonary:     Effort: Pulmonary effort is normal.     Breath sounds: Normal breath sounds.  Skin:    General: Skin is warm and dry.  Neurological:     General: No focal deficit present.     Mental Status: She is alert. Mental status is at baseline.  Psychiatric:        Mood and Affect: Mood normal.        Behavior: Behavior normal.        Thought Content: Thought content normal.        Judgment: Judgment normal.      No results found for any visits on 05/18/24.    The ASCVD Risk score (Arnett DK, et al.,  2019) failed to calculate for the following reasons:   The 2019 ASCVD risk score is only valid for ages 33 to 31    Assessment & Plan:   Problem List Items Addressed This Visit     Hypertension - Primary   Taking verapamil  SR 120 mg daily No  chest pain or shortness of breath, no headaches or vision changes.  134/80 at home  Well controlled upon recheck today. BMP today.  No refill needed.  Follow-up in 3 months.       Relevant Orders   Basic metabolic panel with GFR   Hyperlipidemia, mixed   Taking rosuvastatin  20 mg daily  Tolerates well. No muscle aches.  No refill needed today. Lipid panel.       Relevant Orders   Lipid panel   Gastroesophageal reflux disease   Taking omeprazole  20 mg daily Symptoms are well controlled. No refill needed.        Encounter for administration of vaccine   Flu vaccine in the office. Prescription printed for Covid vaccine for local pharmacy.       Relevant Medications   COVID-19 mRNA vaccine, Pfizer, (COMIRNATY)  syringe   Other Relevant Orders   Flu vaccine HIGH DOSE PF(Fluzone Trivalent) (Completed)   Hypothyroidism   Taking levothyroxine  75 mcg daily Symptoms are well controlled. Thyroid  function today.  No refills needed.       Relevant Orders   TSH+T4F+T3Free   Dysuria   Some discomfort with urination. Has had trace leukocytes in the past. Will get urinalysis today.       Relevant Orders   Urinalysis, Routine w reflex microscopic  Medical decision making includes refills sent on 9/8 and 8/31 before this visit today.  Agrees with plan of care discussed.  Questions answered.   Return in about 3 months (around 08/17/2024) for HTN, Hypothyroid, HLD .    Darice JONELLE Brownie, FNP

## 2024-05-18 NOTE — Assessment & Plan Note (Signed)
 Some discomfort with urination. Has had trace leukocytes in the past. Will get urinalysis today.

## 2024-05-18 NOTE — Assessment & Plan Note (Signed)
 Taking omeprazole  20 mg daily Symptoms are well controlled. No refill needed.

## 2024-05-18 NOTE — Assessment & Plan Note (Signed)
 Flu vaccine in the office. Prescription printed for Covid vaccine for local pharmacy.

## 2024-05-19 ENCOUNTER — Ambulatory Visit: Payer: Self-pay | Admitting: Family Medicine

## 2024-05-19 LAB — TSH+T4F+T3FREE
Free T4: 1.36 ng/dL (ref 0.82–1.77)
T3, Free: 2.8 pg/mL (ref 2.0–4.4)
TSH: 5.67 u[IU]/mL — ABNORMAL HIGH (ref 0.450–4.500)

## 2024-05-19 LAB — URINALYSIS, ROUTINE W REFLEX MICROSCOPIC
Bilirubin, UA: NEGATIVE
Glucose, UA: NEGATIVE
Ketones, UA: NEGATIVE
Nitrite, UA: NEGATIVE
Protein,UA: NEGATIVE
RBC, UA: NEGATIVE
Specific Gravity, UA: 1.016 (ref 1.005–1.030)
Urobilinogen, Ur: 0.2 mg/dL (ref 0.2–1.0)
pH, UA: 6 (ref 5.0–7.5)

## 2024-05-19 LAB — LIPID PANEL
Chol/HDL Ratio: 3.1 ratio (ref 0.0–4.4)
Cholesterol, Total: 174 mg/dL (ref 100–199)
HDL: 57 mg/dL (ref >39)
LDL Chol Calc (NIH): 98 mg/dL (ref 0–99)
Triglycerides: 103 mg/dL (ref 0–149)
VLDL Cholesterol Cal: 19 mg/dL (ref 5–40)

## 2024-05-19 LAB — BASIC METABOLIC PANEL WITH GFR
BUN/Creatinine Ratio: 19 (ref 12–28)
BUN: 15 mg/dL (ref 8–27)
CO2: 23 mmol/L (ref 20–29)
Calcium: 10.2 mg/dL (ref 8.7–10.3)
Chloride: 102 mmol/L (ref 96–106)
Creatinine, Ser: 0.8 mg/dL (ref 0.57–1.00)
Glucose: 90 mg/dL (ref 70–99)
Potassium: 4.4 mmol/L (ref 3.5–5.2)
Sodium: 140 mmol/L (ref 134–144)
eGFR: 74 mL/min/1.73 (ref >59)

## 2024-05-19 LAB — MICROSCOPIC EXAMINATION
Bacteria, UA: NONE SEEN
Casts: NONE SEEN /LPF

## 2024-06-16 ENCOUNTER — Encounter: Payer: Medicare Other | Admitting: Internal Medicine

## 2024-06-28 ENCOUNTER — Encounter: Payer: Medicare Other | Admitting: Internal Medicine

## 2024-07-05 ENCOUNTER — Telehealth: Payer: Self-pay | Admitting: Family Medicine

## 2024-07-05 NOTE — Telephone Encounter (Signed)
 Copied from CRM [id number]. Topic: Clinical - Prescription Issue >> Jul 05, 2024 11:35 AM Amy B wrote: Reason for CRM: Patient is out of verapamil  for the past two weeks and the pharmacy states they are unable to obtain that particular strength.  Patient requests an alternative medication.  Please advise.

## 2024-07-06 ENCOUNTER — Other Ambulatory Visit: Payer: Self-pay | Admitting: Family Medicine

## 2024-07-06 DIAGNOSIS — I10 Essential (primary) hypertension: Secondary | ICD-10-CM

## 2024-07-06 MED ORDER — VERAPAMIL HCL ER 120 MG PO CP24: 120.0000 mg | ORAL_CAPSULE | Freq: Every day | ORAL | 1 refills | Status: DC

## 2024-07-06 NOTE — Telephone Encounter (Signed)
 Contacted [facility] directly. Per the CPhT, the verapamil  120 mg CR tablet is on a national back order since July. No anticipated date provided when medication will become available and back in stock.   The pharmacy has verapamil  120 mg SR capsules in stock. Per the CPhT, a notification was sent to the provider to send in an updated rx refill request for the alternative. Please advise. Thanks in advance.

## 2024-07-13 ENCOUNTER — Other Ambulatory Visit: Payer: Self-pay | Admitting: Family Medicine

## 2024-07-13 DIAGNOSIS — Z1231 Encounter for screening mammogram for malignant neoplasm of breast: Secondary | ICD-10-CM

## 2024-08-04 ENCOUNTER — Ambulatory Visit
Admission: RE | Admit: 2024-08-04 | Discharge: 2024-08-04 | Disposition: A | Source: Ambulatory Visit | Attending: Family Medicine

## 2024-08-04 DIAGNOSIS — Z1231 Encounter for screening mammogram for malignant neoplasm of breast: Secondary | ICD-10-CM | POA: Diagnosis not present

## 2024-08-10 ENCOUNTER — Ambulatory Visit: Payer: Self-pay | Admitting: Family Medicine

## 2024-08-10 ENCOUNTER — Other Ambulatory Visit: Payer: Self-pay | Admitting: Family Medicine

## 2024-08-10 DIAGNOSIS — J302 Other seasonal allergic rhinitis: Secondary | ICD-10-CM

## 2024-08-10 DIAGNOSIS — K219 Gastro-esophageal reflux disease without esophagitis: Secondary | ICD-10-CM

## 2024-08-10 DIAGNOSIS — E782 Mixed hyperlipidemia: Secondary | ICD-10-CM

## 2024-08-10 DIAGNOSIS — J45909 Unspecified asthma, uncomplicated: Secondary | ICD-10-CM

## 2024-08-12 ENCOUNTER — Other Ambulatory Visit: Payer: Self-pay | Admitting: Family Medicine

## 2024-08-12 DIAGNOSIS — E039 Hypothyroidism, unspecified: Secondary | ICD-10-CM

## 2024-08-17 ENCOUNTER — Ambulatory Visit: Admitting: Family Medicine

## 2024-08-18 ENCOUNTER — Encounter: Payer: Self-pay | Admitting: Family Medicine

## 2024-08-18 ENCOUNTER — Ambulatory Visit: Admitting: Family Medicine

## 2024-08-18 VITALS — BP 135/73 | HR 69 | Temp 97.6°F | Ht 63.0 in | Wt 147.0 lb

## 2024-08-18 DIAGNOSIS — E782 Mixed hyperlipidemia: Secondary | ICD-10-CM | POA: Diagnosis not present

## 2024-08-18 DIAGNOSIS — J45909 Unspecified asthma, uncomplicated: Secondary | ICD-10-CM

## 2024-08-18 DIAGNOSIS — I10 Essential (primary) hypertension: Secondary | ICD-10-CM | POA: Diagnosis not present

## 2024-08-18 DIAGNOSIS — E039 Hypothyroidism, unspecified: Secondary | ICD-10-CM | POA: Diagnosis not present

## 2024-08-18 DIAGNOSIS — K219 Gastro-esophageal reflux disease without esophagitis: Secondary | ICD-10-CM

## 2024-08-18 DIAGNOSIS — J302 Other seasonal allergic rhinitis: Secondary | ICD-10-CM | POA: Diagnosis not present

## 2024-08-18 MED ORDER — MONTELUKAST SODIUM 10 MG PO TABS: ORAL_TABLET | ORAL | 1 refills | Status: AC

## 2024-08-18 MED ORDER — VERAPAMIL HCL ER 120 MG PO CP24: 120.0000 mg | ORAL_CAPSULE | Freq: Every day | ORAL | 1 refills | Status: AC

## 2024-08-18 MED ORDER — ROSUVASTATIN CALCIUM 20 MG PO TABS: ORAL_TABLET | ORAL | 1 refills | Status: AC

## 2024-08-18 MED ORDER — OMEPRAZOLE 20 MG PO CPDR: DELAYED_RELEASE_CAPSULE | ORAL | 1 refills | Status: AC

## 2024-08-18 MED ORDER — LEVOTHYROXINE SODIUM 75 MCG PO TABS: ORAL_TABLET | ORAL | 1 refills | Status: DC

## 2024-08-18 NOTE — Progress Notes (Signed)
 Established Patient Office Visit  Subjective   Patient ID: Isabel Oliver, female    DOB: 11/14/1940  Age: 83 y.o. MRN: [id number]  Chief Complaint  Patient presents with   Medical Management of Chronic Issues    Hypothyoid: levothyroxine  75 mcg daily Symptoms are well controlled.  9/9: TSH 5.670;  normal T4, T3, recheck at next visit.  Recheck thyroid  function today  Allergies/asthma: montelukast  10 mg daily, albuterol  2 puffs am  Symptoms are well controlled.  GERD: omeprazole  20 mg daily Symptoms are well controlled.  HLD: rosuvastatin  20 mg daily  Tolerates well.  Lipid panel 05/2024 normal Defer lipid panel until next visit.   HTN: verapamil  SR 120 mg daily Well controlled at home: 128/73 Well controlled in the office today No chest pain or shortness of breath.  05/18/24: GFR 74, K+ 4.4 BMP today.   Refills sent. Follow-up in 6 months, sooner if anything changes.        ROS    Objective:     BP 135/73 (BP Location: Left Arm, Patient Position: Sitting, Cuff Size: Normal)   Pulse 69   Temp 97.6 F (36.4 C) (Oral)   Ht 5' 3 (1.6 m)   Wt 147 lb (66.7 kg)   SpO2 97%   BMI 26.04 kg/m    Physical Exam Vitals and nursing note reviewed.  Constitutional:      General: She is not in acute distress.    Appearance: Normal appearance.  Cardiovascular:     Rate and Rhythm: Normal rate and regular rhythm.     Heart sounds: Normal heart sounds.  Pulmonary:     Effort: Pulmonary effort is normal.     Breath sounds: Normal breath sounds.  Skin:    General: Skin is warm and dry.  Neurological:     General: No focal deficit present.     Mental Status: She is alert. Mental status is at baseline.  Psychiatric:        Mood and Affect: Mood normal.        Behavior: Behavior normal.        Thought Content: Thought content normal.        Judgment: Judgment normal.      No results found for any visits on 08/18/24.    The ASCVD Risk score  (Arnett DK, et al., 2019) failed to calculate for the following reasons:   The 2019 ASCVD risk score is only valid for ages 10 to 84    Assessment & Plan:   Hypothyroidism, unspecified type -     TSH + free T4 -     Levothyroxine  Sodium; TAKE 1 TABLET BY MOUTH 30 TO 60 MINUTES BEFORE BREAKFAST  Dispense: 90 tablet; Refill: 1  Hyperlipidemia, mixed -     Rosuvastatin  Calcium ; TAKE 1 TABLET BY MOUTH DAILY FOR CHOLESTEROL  Dispense: 90 tablet; Refill: 1  Gastroesophageal reflux disease, unspecified whether esophagitis present -     Omeprazole ; TAKE ONE CAPSULE BY MOUTH DAILY TO PREVENT INDIGESTION AND HEARTBURN  Dispense: 90 capsule; Refill: 1  Primary hypertension -     Basic metabolic panel with GFR -     Verapamil  HCl ER; Take 1 capsule (120 mg total) by mouth at bedtime.  Dispense: 90 capsule; Refill: 1  Uncomplicated asthma, unspecified asthma severity, unspecified whether persistent -     Montelukast  Sodium; TAKE 1 TABLET BY MOUTH DAILY FOR ALLERGIES  Dispense: 90 tablet; Refill: 1  Seasonal allergies -  Montelukast  Sodium; TAKE 1 TABLET BY MOUTH DAILY FOR ALLERGIES  Dispense: 90 tablet; Refill: 1    Agrees with plan of care discussed.  Questions answered.   Return in about 6 months (around 02/16/2025) for chronic conditions .    Darice JONELLE Brownie, FNP

## 2024-08-19 ENCOUNTER — Other Ambulatory Visit: Payer: Self-pay | Admitting: Family Medicine

## 2024-08-19 ENCOUNTER — Ambulatory Visit: Payer: Self-pay | Admitting: Family Medicine

## 2024-08-19 DIAGNOSIS — R7989 Other specified abnormal findings of blood chemistry: Secondary | ICD-10-CM | POA: Insufficient documentation

## 2024-08-19 LAB — BASIC METABOLIC PANEL WITH GFR
BUN/Creatinine Ratio: 28 (ref 12–28)
BUN: 19 mg/dL (ref 8–27)
CO2: 24 mmol/L (ref 20–29)
Calcium: 10 mg/dL (ref 8.7–10.3)
Chloride: 102 mmol/L (ref 96–106)
Creatinine, Ser: 0.67 mg/dL (ref 0.57–1.00)
Glucose: 88 mg/dL (ref 70–99)
Potassium: 4.8 mmol/L (ref 3.5–5.2)
Sodium: 140 mmol/L (ref 134–144)
eGFR: 87 mL/min/1.73 (ref >59)

## 2024-08-19 LAB — TSH+FREE T4
Free T4: 1.48 ng/dL (ref 0.82–1.77)
TSH: 5.47 u[IU]/mL — ABNORMAL HIGH (ref 0.450–4.500)

## 2024-08-19 MED ORDER — LEVOTHYROXINE SODIUM 88 MCG PO TABS: 88.0000 ug | ORAL_TABLET | Freq: Every day | ORAL | 1 refills | Status: DC

## 2024-09-03 ENCOUNTER — Ambulatory Visit
Admission: RE | Admit: 2024-09-03 | Discharge: 2024-09-03 | Disposition: A | Discharge status: Home / Self Care | Attending: Family Medicine | Admitting: Family Medicine

## 2024-09-03 ENCOUNTER — Ambulatory Visit: Payer: Self-pay | Admitting: *Deleted

## 2024-09-03 ENCOUNTER — Ambulatory Visit

## 2024-09-03 VITALS — BP 178/78 | HR 85 | Temp 100.0°F | Resp 16 | Ht 63.0 in | Wt 147.0 lb

## 2024-09-03 DIAGNOSIS — R059 Cough, unspecified: Secondary | ICD-10-CM

## 2024-09-03 DIAGNOSIS — J309 Allergic rhinitis, unspecified: Secondary | ICD-10-CM

## 2024-09-03 DIAGNOSIS — R509 Fever, unspecified: Secondary | ICD-10-CM

## 2024-09-03 LAB — POC SOFIA SARS ANTIGEN FIA: SARS Coronavirus 2 Ag: NEGATIVE

## 2024-09-03 LAB — POCT INFLUENZA A/B
Influenza A, POC: NEGATIVE
Influenza B, POC: NEGATIVE

## 2024-09-03 MED ORDER — FEXOFENADINE HCL 180 MG PO TABS: 180.0000 mg | ORAL_TABLET | Freq: Every day | ORAL | 0 refills | Status: AC

## 2024-09-03 MED ORDER — BENZONATATE 200 MG PO CAPS: 200.0000 mg | ORAL_CAPSULE | Freq: Three times a day (TID) | ORAL | 0 refills | Status: AC | PRN

## 2024-09-03 NOTE — ED Provider Notes (Signed)
 " [facility]    CSN: [id number] Arrival date & time: 09/03/24  1543      History   Chief Complaint Chief Complaint  Patient presents with   Cough    HPI Jadzia Ibsen is a 83 y.o. female.   HPI 83 year old female presents with cough, wheezing and Cortner shortness of breath for 1 week.  Patient reports testing negative for COVID and flu 1 week ago.  PMH significant for HTN, HLD, and MVP.  Past Medical History:  Diagnosis Date   Anemia    Asthma    GERD (gastroesophageal reflux disease)    Goiter    Hyperlipidemia    Hypertension    MVP (mitral valve prolapse)    Osteopenia    Other abnormal glucose    Positive colorectal cancer screening using Cologuard test 08/11/2017    Patient Active Problem List   Diagnosis Date Noted   Elevated TSH 08/19/2024   Hypothyroidism 02/11/2024   Dysuria 02/11/2024   Seasonal allergies 02/11/2024   Palpitations 02/19/2023   Hyperthyroidism 06/09/2019   Establishing care with new doctor, encounter for 03/29/2015   Encounter for administration of vaccine 12/05/2014   Vitamin D  deficiency 12/05/2014   Other abnormal glucose    MVP (mitral valve prolapse)    Hypertension    Hyperlipidemia, mixed    Gastroesophageal reflux disease    Asthma     Past Surgical History:  Procedure Laterality Date   APPENDECTOMY     CHOLECYSTECTOMY     EYE SURGERY     TONSILLECTOMY AND ADENOIDECTOMY      OB History   No obstetric history on file.      Home Medications    Prior to Admission medications  Medication Sig Start Date End Date Taking? Authorizing Provider  acetaminophen  (TYLENOL ) 500 MG tablet Take by mouth.   Yes [provider]  albuterol  (VENTOLIN  HFA) 108 (90 Base) MCG/ACT inhaler Use  2 Inhalations  15 minutes  Apart  every 4 hours  as need to Rescue Asthma Attack 02/11/24  Yes Booker Darice SAUNDERS, FNP  Ascorbic Acid (VITAMIN C) 500 MG CAPS Take 1 capsule by mouth daily.   Yes [provider]   aspirin 81 MG chewable tablet Chew 81 mg by mouth daily.   Yes [provider]  benzonatate  (TESSALON ) 200 MG capsule Take 1 capsule (200 mg total) by mouth 3 (three) times daily as needed for up to 7 days. 09/03/24 09/10/24 Yes Teddy Sharper, FNP  Calcium  Carb-Cholecalciferol 1000-800 MG-UNIT TABS Take by mouth.   Yes [provider]  Cholecalciferol (VITAMIN D ) 2000 UNITS CAPS Take by mouth.   Yes [provider]  cyanocobalamin  500 MCG tablet Take 500 mcg by mouth daily.   Yes [provider]  diclofenac  sodium (VOLTAREN ) 1 % GEL APPLY 2 GRAMS TO AFFECTED AREA FOUR TIMES DAILY 09/18/17  Yes Tonita Fallow, MD  fexofenadine  (ALLEGRA  ALLERGY) 180 MG tablet Take 1 tablet (180 mg total) by mouth daily. 09/03/24 09/18/24 Yes Diasia Henken, FNP  levothyroxine  (SYNTHROID ) 88 MCG tablet Take 1 tablet (88 mcg total) by mouth daily before breakfast. 08/19/24  Yes Dolby, Darice SAUNDERS, FNP  Lysine 1000 MG TABS Take by mouth.   Yes [provider]  Magnesium 500 MG TABS Take 1 tablet by mouth daily.   Yes [provider]  montelukast  (SINGULAIR ) 10 MG tablet TAKE 1 TABLET BY MOUTH DAILY FOR ALLERGIES 08/18/24  Yes Booker Darice SAUNDERS, FNP  Multiple Vitamins-Minerals (  MULTIVITAMIN PO) Take by mouth daily.   Yes [provider]  omeprazole  (PRILOSEC) 20 MG capsule TAKE ONE CAPSULE BY MOUTH DAILY TO PREVENT INDIGESTION AND HEARTBURN 08/18/24  Yes Booker Darice SAUNDERS, FNP  Probiotic Product (PROBIOTIC DAILY PO) Take by mouth daily.   Yes [provider]  rosuvastatin  (CRESTOR ) 20 MG tablet TAKE 1 TABLET BY MOUTH DAILY FOR CHOLESTEROL 08/18/24  Yes Booker Darice SAUNDERS, FNP  verapamil  (VERELAN ) 120 MG 24 hr capsule Take 1 capsule (120 mg total) by mouth at bedtime. 08/18/24  Yes Booker Darice SAUNDERS, FNP  zinc gluconate 50 MG tablet Take 50 mg by mouth daily. Takes 100mg    Yes [provider]    Family History Family History  Problem Relation Age of Onset    Hypertension Mother    Diabetes Father    Heart disease Father    Hyperlipidemia Sister    Hyperlipidemia Brother    Breast cancer Neg Hx     Social History Social History[1]   Allergies   Adhesive [tape], Codeine, Fosamax  [alendronate  sodium], Penicillins, Shellfish allergy, Wound dressing adhesive, and Iodine   Review of Systems Review of Systems  Constitutional:  Positive for fatigue and fever.  Respiratory:  Positive for cough.   All other systems reviewed and are negative.    Physical Exam Triage Vital Signs ED Triage Vitals [09/03/24 1615]  Encounter Vitals Group     BP      Girls Systolic BP Percentile      Girls Diastolic BP Percentile      Boys Systolic BP Percentile      Boys Diastolic BP Percentile      Pulse      Resp      Temp      Temp src      SpO2      Weight 147 lb (66.7 kg)     Height 5' 3 (1.6 m)     Head Circumference      Peak Flow      Pain Score 0     Pain Loc      Pain Education      Exclude from Growth Chart    No data found.  Updated Vital Signs BP (!) 178/78 (BP Location: Right Arm)   Pulse 85   Temp 100 F (37.8 C) (Oral)   Resp 16   Ht 5' 3 (1.6 m)   Wt 147 lb (66.7 kg)   SpO2 92%   BMI 26.04 kg/m    Physical Exam Vitals and nursing note reviewed.  Constitutional:      Appearance: Normal appearance. She is obese. She is ill-appearing.  HENT:     Head: Normocephalic and atraumatic.     Right Ear: Tympanic membrane, ear canal and external ear normal.     Left Ear: Tympanic membrane, ear canal and external ear normal.     Mouth/Throat:     Mouth: Mucous membranes are moist.     Pharynx: Oropharynx is clear.  Eyes:     Extraocular Movements: Extraocular movements intact.     Conjunctiva/sclera: Conjunctivae normal.     Pupils: Pupils are equal, round, and reactive to light.  Cardiovascular:     Rate and Rhythm: Normal rate and regular rhythm.     Heart sounds: Normal heart sounds.  Pulmonary:     Effort:  Pulmonary effort is normal.     Breath sounds: Normal breath sounds. No wheezing, rhonchi or rales.     Comments:  Infrequent nonproductive cough on exam Skin:    General: Skin is warm and dry.  Neurological:     General: No focal deficit present.     Mental Status: She is alert and oriented to person, place, and time. Mental status is at baseline.  Psychiatric:        Mood and Affect: Mood normal.        Behavior: Behavior normal.      UC Treatments / Results  Labs (all labs ordered are listed, but only abnormal results are displayed) Labs Reviewed  POCT INFLUENZA A/B - Normal  POC SOFIA SARS ANTIGEN FIA - Normal    EKG   Radiology DG Chest 2 View Result Date: 09/03/2024 CLINICAL DATA:  Cough. EXAM: CHEST - 2 VIEW COMPARISON:  12/05/2009. FINDINGS: The heart size and mediastinal contours are within normal limits. No overt pulmonary edema, focal consolidation, pleural effusion, or pneumothorax. Surgical clips in the right upper quadrant. No acute osseous abnormality. IMPRESSION: No acute cardiopulmonary findings. Electronically Signed   By: Harrietta Sherry M.D.   On: 09/03/2024 17:37    Procedures Procedures (including critical care time)  Medications Ordered in UC Medications - No data to display  Initial Impression / Assessment and Plan / UC Course  I have reviewed the triage vital signs and the nursing notes.  Pertinent labs & imaging results that were available during my care of the patient were reviewed by me and considered in my medical decision making (see chart for details).     MDM: 1.  Cough-CXR revealed above, patient advised, Rx'd Tessalon  200 mg capsules: Take 1 capsule 3 times daily, as needed for cough; 2.  Allergic rhinitis, unspecified seasonality, unspecified trigger-Rx'd Allegra  180 mg tablet: Take 1 tablet daily x 5 days; 3.  Fever-advised patient may take OTC Tylenol  1000 mg every 6 hours for fever (oral temperature greater than 100.3).  Patient  discharged home, hemodynamically stable advised patient chest x-ray was negative today with hardcopy provided.  Advised COVID-19 and influenza were both negative as well.  Advised may take Tessalon  capsules daily or as needed for cough.  Advised take Allegra  daily for the next 5 days.  Advised may take Allegra  as needed afterwards for concurrent postnasal drainage/drip/runny nose . Advised patient may take OTC Tylenol  1000 mg every 6 hours for fever (oral temperature greater than 100.3).  Encouraged increase daily water intake to 64 ounces per day while taking these medications.  Advised if symptoms worsen and/or unresolved please follow-up with your PCP or here for further evaluation.  Final Clinical Impressions(s) / UC Diagnoses   Final diagnoses:  Cough, unspecified type  Fever, unspecified fever cause  Allergic rhinitis, unspecified seasonality, unspecified trigger     Discharge Instructions      Advised patient chest x-ray was negative today with hardcopy provided.  Advised COVID-19 and influenza were both negative as well.  Advised may take Tessalon  capsules daily or as needed for cough.  Advised take Allegra  daily for the next 5 days.  Advised may take Allegra  as needed afterwards for concurrent postnasal drainage/drip/runny nose . Advised patient may take OTC Tylenol  1000 mg every 6 hours for fever (oral temperature greater than 100.3).  Encouraged increase daily water intake to 64 ounces per day while taking these medications.  Advised if symptoms worsen and/or unresolved please follow-up with your PCP or here for further evaluation.     ED Prescriptions     Medication Sig Dispense Auth. Provider   benzonatate  (TESSALON )  200 MG capsule Take 1 capsule (200 mg total) by mouth 3 (three) times daily as needed for up to 7 days. 40 capsule Rocket Gunderson, FNP   fexofenadine  (ALLEGRA  ALLERGY) 180 MG tablet Take 1 tablet (180 mg total) by mouth daily. 15 tablet Alexxander Kurt, FNP       PDMP not reviewed this encounter.    [1]  Social History Tobacco Use   Smoking status: Never   Smokeless tobacco: Never  Vaping Use   Vaping status: Never Used  Substance Use Topics   Alcohol use: No   Drug use: No     Teddy Sharper, FNP 09/03/24 1752  "

## 2024-09-03 NOTE — Discharge Instructions (Addendum)
 Advised patient chest x-ray was negative today with hardcopy provided.  Advised COVID-19 and influenza were both negative as well.  Advised may take Tessalon  capsules daily or as needed for cough.  Advised take Allegra  daily for the next 5 days.  Advised may take Allegra  as needed afterwards for concurrent postnasal drainage/drip/runny nose . Advised patient may take OTC Tylenol  1000 mg every 6 hours for fever (oral temperature greater than 100.3).  Encouraged increase daily water intake to 64 ounces per day while taking these medications.  Advised if symptoms worsen and/or unresolved please follow-up with your PCP or here for further evaluation.

## 2024-09-03 NOTE — Telephone Encounter (Signed)
 FYI Only or Action Required?: Action required by provider: request for appointment, update on patient condition, and recommended UC no available appt until Jan 5.  Patient was last seen in primary care on 08/18/2024 by Booker Darice SAUNDERS, FNP.  Called Nurse Triage reporting Cough. Chest pain with cough , SOB with cough.  Symptoms began several days ago.  Interventions attempted: OTC medications: inhaler and Rest, hydration, or home remedies.  Symptoms are: gradually worsening.  Triage Disposition: See HCP Within 4 Hours (Or PCP Triage)  Patient/caregiver understands and will follow disposition?: Yes    Recommended UC.               Copied from CRM #[id number]. Topic: Clinical - Red Word Triage >> Sep 03, 2024  8:38 AM Rosaria BRAVO wrote: Red Word that prompted transfer to Nurse Triage: Shortness of breath, chest tightness, cough/congestion Reason for Disposition  [1] MILD difficulty breathing (e.g., minimal/no SOB at rest, SOB with walking, pulse < 100) AND [2] still present when not coughing  Answer Assessment - Initial Assessment Questions No available appt today or until Jan5. Recommended UC.      1. ONSET: When did the cough begin?      Last Thursday  2. SEVERITY: How bad is the cough today?      Getting worse hx asthma  3. SPUTUM: Describe the color of your sputum (e.g., none, dry cough; clear, white, yellow, green)     Cloudy  4. HEMOPTYSIS: Are you coughing up any blood? If Yes, ask: How much? (e.g., flecks, streaks, tablespoons, etc.)     na 5. DIFFICULTY BREATHING: Are you having difficulty breathing? If Yes, ask: How bad is it? (e.g., mild, moderate, severe)      Sob with coughing, moderate using inhaler  6. FEVER: Do you have a fever? If Yes, ask: What is your temperature, how was it measured, and when did it start?     Na  7. CARDIAC HISTORY: Do you have any history of heart disease? (e.g., heart attack, congestive heart failure)      Hx HTN 8. LUNG HISTORY: Do you have any history of lung disease?  (e.g., pulmonary embolus, asthma, emphysema)     Hx asthma  9. PE RISK FACTORS: Do you have a history of blood clots? (or: recent major surgery, recent prolonged travel, bedridden)     na 10. OTHER SYMPTOMS: Do you have any other symptoms? (e.g., runny nose, wheezing, chest pain)       Wheezing at night laying down [place], cough cloudy mucus , runny nose , taking coricidin , SOB with coughing  11. PREGNANCY: Is there any chance you are pregnant? When was your last menstrual period?       na 12. TRAVEL: Have you traveled out of the country in the last month? (e.g., travel history, exposures)       na  Protocols used: Cough - Acute Productive-A-AH

## 2024-09-03 NOTE — ED Triage Notes (Signed)
 Patient c/o cough, wheezing, SOB, afebrile x 1 week.  Patient does have asthma.  Tested negative for COVID and FLU on Saturday.  Patient has taken her Albuterol  Inhaler.

## 2024-09-06 ENCOUNTER — Encounter: Payer: Self-pay | Admitting: Family Medicine

## 2024-09-06 NOTE — Telephone Encounter (Signed)
 Labs ordered and waiting.   Copied from CRM #[id number]. Topic: Clinical - Request for Lab/Test Order >> Sep 06, 2024  4:19 PM Wess RAMAN wrote: Reason for CRM: Patient stated she was told by Booker Pao, FNP for labs to recheck her thyroid .  Callback #: [id number]

## 2024-09-14 ENCOUNTER — Ambulatory Visit: Payer: Self-pay | Admitting: Family Medicine

## 2024-09-14 ENCOUNTER — Other Ambulatory Visit: Payer: Self-pay | Admitting: Family Medicine

## 2024-09-14 DIAGNOSIS — R7989 Other specified abnormal findings of blood chemistry: Secondary | ICD-10-CM

## 2024-09-14 LAB — TSH+FREE T4
Free T4: 1.39 ng/dL (ref 0.82–1.77)
TSH: 6.35 u[IU]/mL — ABNORMAL HIGH (ref 0.450–4.500)

## 2024-09-14 MED ORDER — LEVOTHYROXINE SODIUM 100 MCG PO TABS: 100.0000 ug | ORAL_TABLET | Freq: Every day | ORAL | 1 refills | Status: DC

## 2024-09-17 ENCOUNTER — Ambulatory Visit: Payer: Medicare Other | Admitting: Nurse Practitioner

## 2025-02-16 ENCOUNTER — Ambulatory Visit: Admitting: Family Medicine

## 2025-02-16 ENCOUNTER — Encounter: Admitting: Family Medicine
